# Patient Record
Sex: Male | Born: 1965 | Race: Black or African American | Hispanic: No | Marital: Married | Smoking: Never smoker
Health system: Southern US, Community
[De-identification: ages and names within clinical notes are randomized; demographics above are authoritative.]

## PROBLEM LIST (undated history)

## (undated) DIAGNOSIS — Z9114 Patient's other noncompliance with medication regimen: Secondary | ICD-10-CM

## (undated) DIAGNOSIS — I2510 Atherosclerotic heart disease of native coronary artery without angina pectoris: Secondary | ICD-10-CM

## (undated) DIAGNOSIS — I252 Old myocardial infarction: Secondary | ICD-10-CM

## (undated) DIAGNOSIS — C61 Malignant neoplasm of prostate: Secondary | ICD-10-CM

## (undated) DIAGNOSIS — E785 Hyperlipidemia, unspecified: Secondary | ICD-10-CM

## (undated) DIAGNOSIS — R570 Cardiogenic shock: Secondary | ICD-10-CM

## (undated) DIAGNOSIS — I2119 ST elevation (STEMI) myocardial infarction involving other coronary artery of inferior wall: Secondary | ICD-10-CM

## (undated) DIAGNOSIS — Z95820 Peripheral vascular angioplasty status with implants and grafts: Secondary | ICD-10-CM

## (undated) DIAGNOSIS — I219 Acute myocardial infarction, unspecified: Secondary | ICD-10-CM

## (undated) DIAGNOSIS — I5042 Chronic combined systolic (congestive) and diastolic (congestive) heart failure: Secondary | ICD-10-CM

## (undated) DIAGNOSIS — Z955 Presence of coronary angioplasty implant and graft: Secondary | ICD-10-CM

## (undated) DIAGNOSIS — E039 Hypothyroidism, unspecified: Secondary | ICD-10-CM

## (undated) DIAGNOSIS — Z91148 Patient's other noncompliance with medication regimen for other reason: Secondary | ICD-10-CM

## (undated) DIAGNOSIS — I10 Essential (primary) hypertension: Secondary | ICD-10-CM

## (undated) DIAGNOSIS — I255 Ischemic cardiomyopathy: Secondary | ICD-10-CM

## (undated) DIAGNOSIS — N400 Enlarged prostate without lower urinary tract symptoms: Secondary | ICD-10-CM

## (undated) HISTORY — DX: Old myocardial infarction: I25.2

## (undated) HISTORY — PX: PROSTATE BIOPSY: SHX241

## (undated) HISTORY — DX: Patient's other noncompliance with medication regimen for other reason: Z91.148

## (undated) HISTORY — DX: Benign prostatic hyperplasia without lower urinary tract symptoms: N40.0

## (undated) HISTORY — DX: Chronic combined systolic (congestive) and diastolic (congestive) heart failure: I50.42

## (undated) HISTORY — DX: Essential (primary) hypertension: I10

## (undated) HISTORY — DX: Presence of coronary angioplasty implant and graft: Z95.5

## (undated) HISTORY — DX: Ischemic cardiomyopathy: I25.5

## (undated) HISTORY — DX: Hyperlipidemia, unspecified: E78.5

## (undated) HISTORY — DX: Patient's other noncompliance with medication regimen: Z91.14

## (undated) HISTORY — DX: ST elevation (STEMI) myocardial infarction involving other coronary artery of inferior wall: I21.19

## (undated) HISTORY — DX: Atherosclerotic heart disease of native coronary artery without angina pectoris: I25.10

## (undated) NOTE — *Deleted (*Deleted)
Radiation Oncology         [phone]) [phone] ________________________________  Initial outpatient Consultation  Name: Randy Moore MRN: [id number]  Date: 06/19/2020  DOB: December 05, 1965  [name], Randy Picket, PA-C  Pace, Randy Croon, MD   REFERRING PHYSICIAN: Noel Christmas, MD  DIAGNOSIS: 1 y.o. gentleman with Stage T1c adenocarcinoma of the prostate with Gleason score of 3+4, and PSA of 6.49.  No diagnosis found.  HISTORY OF PRESENT ILLNESS: Randy Moore is a 8 y.o. male with a diagnosis of prostate cancer. He was noted to have an elevated PSA of 4.1 by his primary care physician, Randy Pascal, PA-C.  Accordingly, he was referred for evaluation in urology by Dr. Arita Moore on 04/03/2020,  digital rectal examination was performed at that time revealing no nodules.  Repeat PSA performed that day showed further elevation to 6.49.  The patient proceeded to transrectal ultrasound with 12 biopsies of the prostate on 05/01/2020.  The prostate volume measured 25.67 cc.  Out of 12 core biopsies, 5 were positive.  The maximum Gleason score was 3+4, and this was seen in left apex (with PNI). Additionally, Gleason 3+3 was seen in right apex lateral, right apex (two small foci), left apex lateral (two small foci), and left base lateral (small focus).  The patient reviewed the biopsy results with his urologist and he has kindly been referred today for discussion of potential radiation treatment options.   PREVIOUS RADIATION THERAPY: No  PAST MEDICAL HISTORY:  Past Medical History:  Diagnosis Date  . Acute right ventricular myocardial infarction (HCC) 09/21/2016  . Cardiogenic shock (HCC) 09/21/2016  . Chronic combined systolic and diastolic heart failure (HCC) 09/21/2016  . Coronary artery disease involving native coronary artery of native heart without angina pectoris 09/21/2016   a - Inf and RV infarct (STEMI) 1/18: LHC - pLAD, oD1 80, pRI 70, oLCx 30, mLCx 70, RCA 100, EF 45-50 >> PCI:  2.5 x 20 mm Promus DES to pRCA  // Staged PCI 09/22/16: 3 x 38 mm Promus DES to mLAD; Attempted POBA to oD2 (80 >> 80); 3 x 28 mm Promus DES to RI  . Enlarged prostate   . History of ST elevation myocardial infarction (STEMI) 09/21/2016  . Hyperlipidemia 09/29/2016  . Hyperlipidemia 09/29/2016  . Hypertension   . Ischemic cardiomyopathy 09/23/2016   a - s/p Inf and RV infarct (STEMI) 1/18 // b - Echo 09/23/16: EF 35-40, inf-septal HK, apical septal and apical AK, ant and inf AK, ant-septal AK, Gr 1 diastolic dysfunction // c -Echo 5/18: mild conc LVH, EF 35-40, Gr 1 DD, inf-septal, ant-septal, ant, apical AK  . Noncompliance with medication regimen   . Presence of drug coated stent in right coronary artery 09/21/2016  . Prostate cancer (HCC)   . S/P angioplasty with stent, LAD and Ramus with DES 09/22/16 09/23/2016  . STEMI involving oth coronary artery of inferior wall (HCC) 09/21/2016      PAST SURGICAL HISTORY: Past Surgical History:  Procedure Laterality Date  . CARDIAC CATHETERIZATION N/A 09/21/2016   Procedure: Left Heart Cath and Coronary Angiography;  Surgeon: Randy Bollman, MD;  Location: [facility] INVASIVE CV LAB;  Service: Cardiovascular;  Laterality: N/A;  . CARDIAC CATHETERIZATION N/A 09/21/2016   Procedure: Coronary Stent Intervention;  Surgeon: Randy Bollman, MD;  Location: [facility] INVASIVE CV LAB;  Service: Cardiovascular;  Laterality: N/A;  Prox RCA  . CARDIAC CATHETERIZATION N/A 09/22/2016   Procedure: Coronary CTO Intervention;  Surgeon: Randy Hazel, MD;  Location: [facility] INVASIVE CV  LAB;  Service: Cardiovascular;  Laterality: N/A;  . COLONOSCOPY WITH PROPOFOL N/A 12/16/2019   Procedure: COLONOSCOPY WITH PROPOFOL;  Surgeon: Randy Form, MD;  Location: [facility] ENDOSCOPY;  Service: Endoscopy;  Laterality: N/A;  . POLYPECTOMY  12/16/2019   Procedure: POLYPECTOMY;  Surgeon: Randy Form, MD;  Location: [facility] ENDOSCOPY;  Service: Endoscopy;;  . PROSTATE BIOPSY      FAMILY HISTORY:  Family History  Problem Relation  Age of Onset  . Other Mother        chest pains  . Heart attack Father   . Colon cancer Neg Hx   . Liver cancer Neg Hx   . Stomach cancer Neg Hx   . Rectal cancer Neg Hx   . Esophageal cancer Neg Hx     SOCIAL HISTORY:  Social History   Socioeconomic History  . Marital status: Married    Spouse name: Not on file  . Number of children: 4  . Years of education: Not on file  . Highest education level: Not on file  Occupational History  . Occupation: [employer]  Tobacco Use  . Smoking status: Never Smoker  . Smokeless tobacco: Never Used  Vaping Use  . Vaping Use: Never used  Substance and Sexual Activity  . Alcohol use: No  . Drug use: No  . Sexual activity: Yes  Other Topics Concern  . Not on file  Social History Narrative  . Not on file   Social Determinants of Health   Financial Resource Strain:   . Difficulty of Paying Living Expenses: Not on file  Food Insecurity:   . Worried About Programme researcher, broadcasting/film/video in the Last Year: Not on file  . Ran Out of Food in the Last Year: Not on file  Transportation Needs:   . Lack of Transportation (Medical): Not on file  . Lack of Transportation (Non-Medical): Not on file  Physical Activity:   . Days of Exercise per Week: Not on file  . Minutes of Exercise per Session: Not on file  Stress:   . Feeling of Stress : Not on file  Social Connections:   . Frequency of Communication with Friends and Family: Not on file  . Frequency of Social Gatherings with Friends and Family: Not on file  . Attends Religious Services: Not on file  . Active Member of Clubs or Organizations: Not on file  . Attends Banker Meetings: Not on file  . Marital Status: Not on file  Intimate Partner Violence:   . Fear of Current or Ex-Partner: Not on file  . Emotionally Abused: Not on file  . Physically Abused: Not on file  . Sexually Abused: Not on file    ALLERGIES: Patient has no known allergies.  MEDICATIONS:  Current  Outpatient Medications  Medication Sig Dispense Refill  . aspirin EC 81 MG EC tablet Take 1 tablet (81 mg total) by mouth daily.    Marland Kitchen atorvastatin (LIPITOR) 80 MG tablet Take 1 tablet (80 mg total) by mouth every evening. 90 tablet 2  . carvedilol (COREG) 6.25 MG tablet Take 1 tablet (6.25 mg total) by mouth in the morning and at bedtime. 180 tablet 3  . ezetimibe (ZETIA) 10 MG tablet Take 1 tablet (10 mg total) by mouth daily. 90 tablet 3  . famotidine (PEPCID) 20 MG tablet TAKE 1 TABLET(20 MG) BY MOUTH TWICE DAILY 60 tablet 5  . meloxicam (MOBIC) 7.5 MG tablet TAKE 1 TABLET(7.5 MG) BY MOUTH DAILY (Patient  not taking: Reported on 06/08/2020) 30 tablet 0  . nitroGLYCERIN (NITROSTAT) 0.4 MG SL tablet PLACE 1 TABLET UNDER TONGUE EVERY 5 MINUTES FOR 3 DOSES AS NEEDED FOR CHEST PAIN (Patient not taking: Reported on 05/22/2020) 25 tablet 4  . sacubitril-valsartan (ENTRESTO) 97-103 MG Take 1 tablet by mouth 2 (two) times daily. 60 tablet 11  . tamsulosin (FLOMAX) 0.4 MG CAPS capsule Take 0.4 mg by mouth at bedtime.     No current facility-administered medications for this encounter.    REVIEW OF SYSTEMS:  On review of systems, the patient reports that he is doing well overall. He denies any chest pain, shortness of breath, cough, fevers, chills, night sweats, unintended weight changes. He denies any bowel disturbances, and denies abdominal pain, nausea or vomiting. He denies any new musculoskeletal or joint aches or pains. His IPSS was ***, indicating *** urinary symptoms. His SHIM was ***, indicating he {does not have/has mild/moderate/severe} erectile dysfunction. A complete review of systems is obtained and is otherwise negative.    PHYSICAL EXAM:  Wt Readings from Last 3 Encounters:  06/08/20 178 lb 14.4 oz (81.1 kg)  05/22/20 176 lb (79.8 kg)  04/23/20 181 lb 12.8 oz (82.5 kg)   Temp Readings from Last 3 Encounters:  01/24/20 (!) 97.4 F (36.3 C)  12/16/19 (!) 97.5 F (36.4 C) (Axillary)   11/15/19 98.3 F (36.8 C)   BP Readings from Last 3 Encounters:  06/08/20 122/70  05/22/20 132/80  04/23/20 104/72   Pulse Readings from Last 3 Encounters:  06/08/20 71  05/22/20 63  04/23/20 73    /10  In general this is a well appearing African American male in no acute distress. He is alert and oriented x4 and appropriate throughout the examination. HEENT reveals that the patient is normocephalic, atraumatic. EOMs are intact. PERRLA. Skin is intact without any evidence of gross lesions. Cardiovascular exam reveals a regular rate and rhythm, no clicks rubs or murmurs are auscultated. Chest is clear to auscultation bilaterally. Lymphatic assessment is performed and does not reveal any adenopathy in the cervical, supraclavicular, axillary, or inguinal chains. Abdomen has active bowel sounds in all quadrants and is intact. The abdomen is soft, non tender, non distended. Lower extremities are negative for pretibial pitting edema, deep calf tenderness, cyanosis or clubbing.   KPS = ***  100 - Normal; no complaints; no evidence of disease. 90   - Able to carry on normal activity; minor signs or symptoms of disease. 80   - Normal activity with effort; some signs or symptoms of disease. 18   - Cares for self; unable to carry on normal activity or to do active work. 60   - Requires occasional assistance, but is able to care for most of his personal needs. 50   - Requires considerable assistance and frequent medical care. 40   - Disabled; requires special care and assistance. 30   - Severely disabled; hospital admission is indicated although death not imminent. 20   - Very sick; hospital admission necessary; active supportive treatment necessary. 10   - Moribund; fatal processes progressing rapidly. 0     - Dead  Karnofsky DA, Abelmann WH, Craver LS and Burchenal [facility] [phone]) The use of the nitrogen mustards in the palliative treatment of carcinoma: with particular reference to bronchogenic  carcinoma Cancer 1 634-56  LABORATORY DATA:  Lab Results  Component Value Date   WBC 4.2 12/13/2017   HGB 14.5 12/13/2017   HCT 42.7 12/13/2017  MCV 81.8 12/13/2017   PLT 163 12/13/2017   Lab Results  Component Value Date   NA 141 06/08/2020   K 4.0 06/08/2020   CL 102 06/08/2020   CO2 29 06/08/2020   Lab Results  Component Value Date   ALT 25 04/10/2020   AST 25 04/10/2020   ALKPHOS 72 04/10/2020   BILITOT 0.8 04/10/2020     RADIOGRAPHY: ECHOCARDIOGRAM COMPLETE  Result Date: 05/30/2020    ECHOCARDIOGRAM REPORT   Patient Name:   SHEDRIC FREDERICKS   Date of Exam: 05/29/2020 Medical Rec #:  [id number]     Height:       68.0 in Accession #:    [id number]    Weight:       176.0 lb Date of Birth:  03-18-1966     BSA:          1.936 m Patient Age:    54 years      BP:           104/72 mmHg Patient Gender: M             HR:           70 bpm. Exam Location:  [address] Procedure: 2D Echo, Cardiac Doppler, Color Doppler and Intracardiac            Opacification Agent Indications:    I50.9 Congestive heart failure.  History:        Patient has prior history of Echocardiogram examinations, most                 recent 02/18/2019. Risk Factors:Hypertension and Dyslipidemia.                 Cardiogenic shock. Chronic systolic heart failure. STEMI.                 Ischemic cardiomyopathy. Coronary artery disease.  Sonographer:    Sedonia Small Rodgers-Jones RDCS Referring Phys: [id number] MICHAEL COOPER IMPRESSIONS  1. Left ventricular ejection fraction, by estimation, is 40 to 45%. The left ventricle has mildly decreased function. The left ventricle demonstrates regional wall motion abnormalities (see scoring diagram/findings for description). Left ventricular diastolic parameters are consistent with Grade I diastolic dysfunction (impaired relaxation).  2. Right ventricular systolic function is normal. The right ventricular size is normal. Tricuspid regurgitation signal is inadequate for assessing PA pressure.  3. The  mitral valve is normal in structure. No evidence of mitral valve regurgitation.  4. The aortic valve is tricuspid. Aortic valve regurgitation is not visualized. No aortic stenosis is present.  5. The inferior vena cava is normal in size with greater than 50% respiratory variability, suggesting right atrial pressure of 3 mmHg. FINDINGS  Left Ventricle: Left ventricular ejection fraction, by estimation, is 40 to 45%. The left ventricle has mildly decreased function. The left ventricle demonstrates regional wall motion abnormalities. Definity contrast agent was given IV to delineate the left ventricular endocardial borders. The left ventricular internal cavity size was normal in size. There is no left ventricular hypertrophy. Left ventricular diastolic parameters are consistent with Grade I diastolic dysfunction (impaired relaxation).  LV Wall Scoring: The mid and distal anterior septum, apical anterior segment, and apex are akinetic. The mid inferoseptal segment and mid anterior segment are hypokinetic. The entire lateral wall, entire inferior wall, basal anteroseptal segment, basal anterior segment, and basal inferoseptal segment are normal. Right Ventricle: The right ventricular size is normal. Right vetricular wall thickness was not assessed. Right ventricular systolic function is normal. Tricuspid  regurgitation signal is inadequate for assessing PA pressure. Left Atrium: Left atrial size was normal in size. Right Atrium: Right atrial size was normal in size. Pericardium: There is no evidence of pericardial effusion. Mitral Valve: The mitral valve is normal in structure. No evidence of mitral valve regurgitation. Tricuspid Valve: The tricuspid valve is normal in structure. Tricuspid valve regurgitation is trivial. Aortic Valve: The aortic valve is tricuspid. Aortic valve regurgitation is not visualized. No aortic stenosis is present. Pulmonic Valve: The pulmonic valve was not well visualized. Pulmonic valve  regurgitation is not visualized. Aorta: The aortic root and ascending aorta are structurally normal, with no evidence of dilitation. Venous: The inferior vena cava is normal in size with greater than 50% respiratory variability, suggesting right atrial pressure of 3 mmHg. IAS/Shunts: The interatrial septum was not well visualized.  LEFT VENTRICLE PLAX 2D LVIDd:         5.10 cm  Diastology LVIDs:         3.10 cm  LV e' medial:    6.64 cm/s LV PW:         0.70 cm  LV E/e' medial:  13.1 LV IVS:        0.60 cm  LV e' lateral:   6.74 cm/s LVOT diam:     1.70 cm  LV E/e' lateral: 12.9 LV SV:         49 LV SV Index:   25 LVOT Area:     2.27 cm  RIGHT VENTRICLE             IVC RV Basal diam:  4.20 cm     IVC diam: 0.80 cm RV S prime:     16.00 cm/s TAPSE (M-mode): 2.6 cm LEFT ATRIUM             Index       RIGHT ATRIUM           Index LA diam:        3.40 cm 1.76 cm/m  RA Area:     15.20 cm LA Vol (A2C):   43.6 ml 22.52 ml/m RA Volume:   42.00 ml  21.70 ml/m LA Vol (A4C):   52.0 ml 26.86 ml/m LA Biplane Vol: 49.0 ml 25.31 ml/m  AORTIC VALVE LVOT Vmax:   109.00 cm/s LVOT Vmean:  75.500 cm/s LVOT VTI:    0.214 m  AORTA Ao Root diam: 2.90 cm Ao Asc diam:  3.40 cm MITRAL VALVE MV Area (PHT): 3.21 cm     SHUNTS MV Decel Time: 236 msec     Systemic VTI:  0.21 m MV E velocity: 87.00 cm/s   Systemic Diam: 1.70 cm MV A velocity: 104.00 cm/s MV E/A ratio:  0.84 Epifanio Lesches MD Electronically signed by Epifanio Lesches MD Signature Date/Time: 05/30/2020/2:31:29 PM    Final       IMPRESSION/PLAN: 1. 41 y.o. gentleman with Stage T1c adenocarcinoma of the prostate with Gleason Score of 3+4, and PSA of 6.49. We discussed the patient's workup and outlined the nature of prostate cancer in this setting. The patient's T stage, Gleason's score, and PSA put him into the favorable intermediate risk group. Accordingly, he is eligible for a variety of potential treatment options including brachytherapy, 5.5 weeks of  external radiation, or prostatectomy. We discussed the available radiation techniques, and focused on the details and logistics of delivery. We discussed and outlined the risks, benefits, short and long-term effects associated with radiotherapy and compared and contrasted these with  prostatectomy. We discussed the role of SpaceOAR gel in reducing the rectal toxicity associated with radiotherapy.  He appears to have a good understanding of his disease and our treatment recommendations which are of curative intent.  He was encouraged to ask questions that were answered to his stated satisfaction.  At the conclusion of our conversation, the patient is interested in moving forward with ***.  We spent {CHL ONC TIME VISIT - UEAVW:[id number]} face to face with the patient and more than 50% of that time was spent in counseling and/or coordination of care.    Marguarite Arbour, PA-C    Margaretmary Dys, MD  [facility]  Radiation Oncology Direct Dial: [phone]  Fax: [phone] [email or web address]  Skype  [email or web address]   This document serves as a record of services personally performed by Margaretmary Dys, MD and Marcello Fennel, PA-C. It was created on their behalf by Mickie Bail, a trained medical scribe. The creation of this record is based on the scribe's personal observations and the provider's statements to them. This document has been checked and approved by the attending provider.

---

## 2016-09-21 ENCOUNTER — Encounter (HOSPITAL_COMMUNITY)
Admission: EM | Disposition: A | Discharge status: Home / Self Care | Payer: Self-pay | Source: Home / Self Care | Attending: Cardiovascular Disease

## 2016-09-21 ENCOUNTER — Inpatient Hospital Stay (HOSPITAL_COMMUNITY)
Admission: EM | Admit: 2016-09-21 | Discharge: 2016-09-23 | DRG: 246 | Disposition: A | Discharge status: Home / Self Care | Payer: Managed Care, Other (non HMO) | Attending: Cardiovascular Disease | Admitting: Cardiovascular Disease

## 2016-09-21 ENCOUNTER — Ambulatory Visit (HOSPITAL_COMMUNITY): Admit: 2016-09-21 | Payer: Self-pay | Admitting: Cardiovascular Disease

## 2016-09-21 ENCOUNTER — Encounter (HOSPITAL_COMMUNITY): Payer: Self-pay

## 2016-09-21 ENCOUNTER — Emergency Department (HOSPITAL_COMMUNITY): Payer: Managed Care, Other (non HMO)

## 2016-09-21 DIAGNOSIS — I219 Acute myocardial infarction, unspecified: Secondary | ICD-10-CM | POA: Diagnosis present

## 2016-09-21 DIAGNOSIS — Z23 Encounter for immunization: Secondary | ICD-10-CM | POA: Diagnosis not present

## 2016-09-21 DIAGNOSIS — I2510 Atherosclerotic heart disease of native coronary artery without angina pectoris: Secondary | ICD-10-CM

## 2016-09-21 DIAGNOSIS — R001 Bradycardia, unspecified: Secondary | ICD-10-CM | POA: Diagnosis present

## 2016-09-21 DIAGNOSIS — I5041 Acute combined systolic (congestive) and diastolic (congestive) heart failure: Secondary | ICD-10-CM | POA: Diagnosis present

## 2016-09-21 DIAGNOSIS — K219 Gastro-esophageal reflux disease without esophagitis: Secondary | ICD-10-CM | POA: Diagnosis present

## 2016-09-21 DIAGNOSIS — I25119 Atherosclerotic heart disease of native coronary artery with unspecified angina pectoris: Secondary | ICD-10-CM

## 2016-09-21 DIAGNOSIS — E876 Hypokalemia: Secondary | ICD-10-CM | POA: Diagnosis present

## 2016-09-21 DIAGNOSIS — Z8679 Personal history of other diseases of the circulatory system: Secondary | ICD-10-CM | POA: Diagnosis present

## 2016-09-21 DIAGNOSIS — I959 Hypotension, unspecified: Secondary | ICD-10-CM | POA: Diagnosis present

## 2016-09-21 DIAGNOSIS — I2111 ST elevation (STEMI) myocardial infarction involving right coronary artery: Secondary | ICD-10-CM | POA: Diagnosis not present

## 2016-09-21 DIAGNOSIS — I429 Cardiomyopathy, unspecified: Secondary | ICD-10-CM | POA: Diagnosis present

## 2016-09-21 DIAGNOSIS — I5089 Other heart failure: Secondary | ICD-10-CM | POA: Diagnosis present

## 2016-09-21 DIAGNOSIS — I2119 ST elevation (STEMI) myocardial infarction involving other coronary artery of inferior wall: Principal | ICD-10-CM | POA: Diagnosis present

## 2016-09-21 DIAGNOSIS — I213 ST elevation (STEMI) myocardial infarction of unspecified site: Secondary | ICD-10-CM

## 2016-09-21 DIAGNOSIS — Z955 Presence of coronary angioplasty implant and graft: Secondary | ICD-10-CM

## 2016-09-21 DIAGNOSIS — I5042 Chronic combined systolic (congestive) and diastolic (congestive) heart failure: Secondary | ICD-10-CM | POA: Diagnosis present

## 2016-09-21 DIAGNOSIS — R079 Chest pain, unspecified: Secondary | ICD-10-CM

## 2016-09-21 DIAGNOSIS — R570 Cardiogenic shock: Secondary | ICD-10-CM

## 2016-09-21 DIAGNOSIS — R0789 Other chest pain: Secondary | ICD-10-CM | POA: Diagnosis present

## 2016-09-21 DIAGNOSIS — I252 Old myocardial infarction: Secondary | ICD-10-CM

## 2016-09-21 DIAGNOSIS — R9431 Abnormal electrocardiogram [ECG] [EKG]: Secondary | ICD-10-CM | POA: Diagnosis not present

## 2016-09-21 DIAGNOSIS — I2582 Chronic total occlusion of coronary artery: Secondary | ICD-10-CM | POA: Diagnosis not present

## 2016-09-21 DIAGNOSIS — Z95820 Peripheral vascular angioplasty status with implants and grafts: Secondary | ICD-10-CM

## 2016-09-21 DIAGNOSIS — I25110 Atherosclerotic heart disease of native coronary artery with unstable angina pectoris: Secondary | ICD-10-CM | POA: Diagnosis present

## 2016-09-21 DIAGNOSIS — I255 Ischemic cardiomyopathy: Secondary | ICD-10-CM

## 2016-09-21 DIAGNOSIS — Z87898 Personal history of other specified conditions: Secondary | ICD-10-CM | POA: Diagnosis present

## 2016-09-21 HISTORY — DX: Peripheral vascular angioplasty status with implants and grafts: Z95.820

## 2016-09-21 HISTORY — DX: Old myocardial infarction: I25.2

## 2016-09-21 HISTORY — DX: Presence of coronary angioplasty implant and graft: Z95.5

## 2016-09-21 HISTORY — DX: ST elevation (STEMI) myocardial infarction involving other coronary artery of inferior wall: I21.19

## 2016-09-21 HISTORY — DX: Acute myocardial infarction, unspecified: I21.9

## 2016-09-21 HISTORY — DX: Cardiogenic shock: R57.0

## 2016-09-21 HISTORY — DX: Chronic combined systolic (congestive) and diastolic (congestive) heart failure: I50.42

## 2016-09-21 HISTORY — PX: CARDIAC CATHETERIZATION: SHX172

## 2016-09-21 HISTORY — DX: Atherosclerotic heart disease of native coronary artery without angina pectoris: I25.10

## 2016-09-21 LAB — CBC WITH DIFFERENTIAL/PLATELET
BASOS ABS: 0 10*3/uL (ref 0.0–0.1)
BASOS PCT: 0 %
Eosinophils Absolute: 0.2 10*3/uL (ref 0.0–0.7)
Eosinophils Relative: 3 %
HEMATOCRIT: 42.1 % (ref 39.0–52.0)
Hemoglobin: 14.4 g/dL (ref 13.0–17.0)
LYMPHS PCT: 41 %
Lymphs Abs: 4 10*3/uL (ref 0.7–4.0)
MCH: 28.1 pg (ref 26.0–34.0)
MCHC: 34.2 g/dL (ref 30.0–36.0)
MCV: 82.1 fL (ref 78.0–100.0)
Monocytes Absolute: 0.4 10*3/uL (ref 0.1–1.0)
Monocytes Relative: 4 %
NEUTROS ABS: 5 10*3/uL (ref 1.7–7.7)
NEUTROS PCT: 52 %
Platelets: 180 10*3/uL (ref 150–400)
RBC: 5.13 MIL/uL (ref 4.22–5.81)
RDW: 13.1 % (ref 11.5–15.5)
WBC: 9.6 10*3/uL (ref 4.0–10.5)

## 2016-09-21 LAB — TROPONIN I
TROPONIN I: 2.35 ng/mL — AB (ref <0.03)
Troponin I: 15.96 ng/mL (ref <0.03)
Troponin I: 20.68 ng/mL (ref <0.03)

## 2016-09-21 LAB — LIPID PANEL
CHOLESTEROL: 136 mg/dL (ref 0–200)
HDL: 38 mg/dL — ABNORMAL LOW (ref >40)
LDL Cholesterol: 92 mg/dL (ref 0–99)
TRIGLYCERIDES: 32 mg/dL (ref <150)
Total CHOL/HDL Ratio: 3.6 RATIO
VLDL: 6 mg/dL (ref 0–40)

## 2016-09-21 LAB — I-STAT TROPONIN, ED: Troponin i, poc: 0 ng/mL (ref 0.00–0.08)

## 2016-09-21 LAB — BASIC METABOLIC PANEL
ANION GAP: 15 (ref 5–15)
ANION GAP: 4 — AB (ref 5–15)
BUN: 6 mg/dL (ref 6–20)
BUN: 5 mg/dL — ABNORMAL LOW (ref 6–20)
CALCIUM: 9 mg/dL (ref 8.9–10.3)
CHLORIDE: 111 mmol/L (ref 101–111)
CO2: 27 mmol/L (ref 22–32)
CO2: 25 mmol/L (ref 22–32)
Calcium: 8.2 mg/dL — ABNORMAL LOW (ref 8.9–10.3)
Chloride: 101 mmol/L (ref 101–111)
Creatinine, Ser: 1.46 mg/dL — ABNORMAL HIGH (ref 0.61–1.24)
Creatinine, Ser: 1.13 mg/dL (ref 0.61–1.24)
GFR calc non Af Amer: >60 mL/min (ref >60)
GFR, EST NON AFRICAN AMERICAN: 54 mL/min — AB (ref >60)
GLUCOSE: 107 mg/dL — AB (ref 65–99)
Glucose, Bld: 145 mg/dL — ABNORMAL HIGH (ref 65–99)
POTASSIUM: 3.1 mmol/L — AB (ref 3.5–5.1)
Potassium: 4.1 mmol/L (ref 3.5–5.1)
SODIUM: 143 mmol/L (ref 135–145)
Sodium: 140 mmol/L (ref 135–145)

## 2016-09-21 LAB — POCT ACTIVATED CLOTTING TIME
ACTIVATED CLOTTING TIME: 219 s
Activated Clotting Time: 235 seconds

## 2016-09-21 LAB — CBC
HEMATOCRIT: 37.5 % — AB (ref 39.0–52.0)
HEMOGLOBIN: 12.7 g/dL — AB (ref 13.0–17.0)
MCH: 27.6 pg (ref 26.0–34.0)
MCHC: 33.9 g/dL (ref 30.0–36.0)
MCV: 81.5 fL (ref 78.0–100.0)
Platelets: 154 10*3/uL (ref 150–400)
RBC: 4.6 MIL/uL (ref 4.22–5.81)
RDW: 13.3 % (ref 11.5–15.5)
WBC: 10.7 10*3/uL — ABNORMAL HIGH (ref 4.0–10.5)

## 2016-09-21 LAB — MRSA PCR SCREENING: MRSA BY PCR: NEGATIVE

## 2016-09-21 LAB — HEPARIN LEVEL (UNFRACTIONATED): Heparin Unfractionated: 0.35 IU/mL (ref 0.30–0.70)

## 2016-09-21 LAB — T4, FREE: FREE T4: 1.08 ng/dL (ref 0.61–1.12)

## 2016-09-21 LAB — TSH: TSH: 2.579 u[IU]/mL (ref 0.350–4.500)

## 2016-09-21 SURGERY — LEFT HEART CATH AND CORONARY ANGIOGRAPHY

## 2016-09-21 MED ORDER — FUROSEMIDE 10 MG/ML IJ SOLN
20.0000 mg | Freq: Two times a day (BID) | INTRAMUSCULAR | Status: AC
  Administered 2016-09-21 (×2): 20 mg via INTRAVENOUS
  Filled 2016-09-21 (×2): qty 2

## 2016-09-21 MED ORDER — IOPAMIDOL (ISOVUE-370) INJECTION 76%
INTRAVENOUS | Status: AC
  Filled 2016-09-21: qty 50

## 2016-09-21 MED ORDER — SODIUM CHLORIDE 0.9 % IV BOLUS (SEPSIS)
1000.0000 mL | Freq: Once | INTRAVENOUS | Status: AC
  Administered 2016-09-21: 1000 mL via INTRAVENOUS

## 2016-09-21 MED ORDER — TIROFIBAN (AGGRASTAT) BOLUS VIA INFUSION
INTRAVENOUS | Status: DC | PRN
  Administered 2016-09-21: 2030 ug via INTRAVENOUS

## 2016-09-21 MED ORDER — SODIUM CHLORIDE 0.9% FLUSH
3.0000 mL | Freq: Two times a day (BID) | INTRAVENOUS | Status: DC
  Administered 2016-09-21: 3 mL via INTRAVENOUS

## 2016-09-21 MED ORDER — HEPARIN SODIUM (PORCINE) 5000 UNIT/ML IJ SOLN: 5000.0000 [IU] | Freq: Three times a day (TID) | INTRAMUSCULAR | Status: DC

## 2016-09-21 MED ORDER — PROPOFOL 1000 MG/100ML IV EMUL
INTRAVENOUS | Status: AC
  Filled 2016-09-21: qty 100

## 2016-09-21 MED ORDER — HEPARIN SODIUM (PORCINE) 1000 UNIT/ML IJ SOLN
INTRAMUSCULAR | Status: AC
Start: 2016-09-21 — End: 2016-09-21
  Filled 2016-09-21: qty 1

## 2016-09-21 MED ORDER — INFLUENZA VAC SPLIT QUAD 0.5 ML IM SUSY
0.5000 mL | PREFILLED_SYRINGE | INTRAMUSCULAR | Status: AC
  Administered 2016-09-23: 0.5 mL via INTRAMUSCULAR
  Filled 2016-09-21: qty 0.5

## 2016-09-21 MED ORDER — TIROFIBAN HCL IN NACL 5-0.9 MG/100ML-% IV SOLN
0.1500 ug/kg/min | INTRAVENOUS | Status: DC
  Administered 2016-09-21: 0.15 ug/kg/min via INTRAVENOUS
  Filled 2016-09-21: qty 100

## 2016-09-21 MED ORDER — ATROPINE SULFATE 1 MG/10ML IJ SOSY
0.6000 mg | PREFILLED_SYRINGE | Freq: Once | INTRAMUSCULAR | Status: AC
  Administered 2016-09-21: 0.6 mg via INTRAVENOUS

## 2016-09-21 MED ORDER — IOPAMIDOL (ISOVUE-370) INJECTION 76%
INTRAVENOUS | Status: AC
  Filled 2016-09-21: qty 125

## 2016-09-21 MED ORDER — SODIUM CHLORIDE 0.9 % IV SOLN: 250.0000 mL | INTRAVENOUS | Status: DC | PRN

## 2016-09-21 MED ORDER — VERAPAMIL HCL 2.5 MG/ML IV SOLN
INTRAVENOUS | Status: AC
  Filled 2016-09-21: qty 2

## 2016-09-21 MED ORDER — HEPARIN (PORCINE) IN NACL 100-0.45 UNIT/ML-% IJ SOLN
1000.0000 [IU]/h | INTRAMUSCULAR | Status: DC
Start: 2016-09-21 — End: 2016-09-21
  Filled 2016-09-21 (×2): qty 250

## 2016-09-21 MED ORDER — HEPARIN (PORCINE) IN NACL 100-0.45 UNIT/ML-% IJ SOLN
1000.0000 [IU]/h | INTRAMUSCULAR | Status: DC
  Administered 2016-09-21: 1000 [IU]/h via INTRAVENOUS
  Filled 2016-09-21: qty 250

## 2016-09-21 MED ORDER — TICAGRELOR 90 MG PO TABS
180.0000 mg | ORAL_TABLET | Freq: Once | ORAL | Status: AC
  Administered 2016-09-21: 180 mg via ORAL

## 2016-09-21 MED ORDER — HEPARIN SODIUM (PORCINE) 5000 UNIT/ML IJ SOLN
4000.0000 [IU] | Freq: Once | INTRAMUSCULAR | Status: AC
Start: 2016-09-21 — End: 2016-09-21
  Administered 2016-09-21: 4000 [IU] via INTRAVENOUS
  Filled 2016-09-21: qty 1

## 2016-09-21 MED ORDER — ONDANSETRON HCL 4 MG/2ML IJ SOLN
INTRAMUSCULAR | Status: AC
  Filled 2016-09-21: qty 2

## 2016-09-21 MED ORDER — SODIUM CHLORIDE 0.9 % WEIGHT BASED INFUSION
3.0000 mL/kg/h | INTRAVENOUS | Status: DC
  Administered 2016-09-22: 3 mL/kg/h via INTRAVENOUS

## 2016-09-21 MED ORDER — TIROFIBAN HCL IN NACL 5-0.9 MG/100ML-% IV SOLN
INTRAVENOUS | Status: AC
  Filled 2016-09-21: qty 100

## 2016-09-21 MED ORDER — ONDANSETRON HCL 4 MG/2ML IJ SOLN: 4.0000 mg | Freq: Four times a day (QID) | INTRAMUSCULAR | Status: DC | PRN

## 2016-09-21 MED ORDER — LABETALOL HCL 5 MG/ML IV SOLN: 10.0000 mg | INTRAVENOUS | Status: AC | PRN

## 2016-09-21 MED ORDER — SODIUM CHLORIDE 0.9 % IV SOLN
INTRAVENOUS | Status: DC | PRN
  Administered 2016-09-21: 50 mL/h via INTRAVENOUS

## 2016-09-21 MED ORDER — MORPHINE SULFATE (PF) 2 MG/ML IV SOLN: 2.0000 mg | INTRAVENOUS | Status: DC | PRN

## 2016-09-21 MED ORDER — LIDOCAINE HCL (PF) 1 % IJ SOLN
INTRAMUSCULAR | Status: DC | PRN
  Administered 2016-09-21: 2 mL

## 2016-09-21 MED ORDER — HYDRALAZINE HCL 20 MG/ML IJ SOLN: 5.0000 mg | INTRAMUSCULAR | Status: AC | PRN

## 2016-09-21 MED ORDER — SODIUM CHLORIDE 0.9 % IV SOLN
INTRAVENOUS | Status: AC
  Administered 2016-09-21: 04:00:00 via INTRAVENOUS

## 2016-09-21 MED ORDER — HEPARIN (PORCINE) IN NACL 2-0.9 UNIT/ML-% IJ SOLN
INTRAMUSCULAR | Status: AC
  Filled 2016-09-21: qty 1000

## 2016-09-21 MED ORDER — NITROGLYCERIN 1 MG/10 ML FOR IR/CATH LAB
INTRA_ARTERIAL | Status: AC
Start: 2016-09-21 — End: 2016-09-21
  Filled 2016-09-21: qty 10

## 2016-09-21 MED ORDER — SODIUM CHLORIDE 0.9% FLUSH: 3.0000 mL | INTRAVENOUS | Status: DC | PRN

## 2016-09-21 MED ORDER — ASPIRIN 81 MG PO CHEW
324.0000 mg | CHEWABLE_TABLET | Freq: Once | ORAL | Status: AC
  Administered 2016-09-21: 324 mg via ORAL
  Filled 2016-09-21: qty 4

## 2016-09-21 MED ORDER — NITROGLYCERIN 1 MG/10 ML FOR IR/CATH LAB
INTRA_ARTERIAL | Status: DC | PRN
  Administered 2016-09-21: 100 ug

## 2016-09-21 MED ORDER — HEPARIN (PORCINE) IN NACL 2-0.9 UNIT/ML-% IJ SOLN
INTRAMUSCULAR | Status: DC | PRN
  Administered 2016-09-21: 10 mL via INTRA_ARTERIAL

## 2016-09-21 MED ORDER — NITROGLYCERIN 0.4 MG SL SUBL: 0.4000 mg | SUBLINGUAL_TABLET | SUBLINGUAL | Status: DC | PRN

## 2016-09-21 MED ORDER — ATORVASTATIN CALCIUM 80 MG PO TABS
80.0000 mg | ORAL_TABLET | Freq: Every day | ORAL | Status: DC
  Administered 2016-09-21 – 2016-09-23 (×3): 80 mg via ORAL
  Filled 2016-09-21 (×3): qty 1

## 2016-09-21 MED ORDER — TICAGRELOR 90 MG PO TABS
90.0000 mg | ORAL_TABLET | Freq: Two times a day (BID) | ORAL | Status: DC
  Administered 2016-09-21 – 2016-09-23 (×4): 90 mg via ORAL
  Filled 2016-09-21 (×5): qty 1

## 2016-09-21 MED ORDER — ASPIRIN EC 81 MG PO TBEC
81.0000 mg | DELAYED_RELEASE_TABLET | Freq: Every day | ORAL | Status: DC
  Administered 2016-09-22 – 2016-09-23 (×2): 81 mg via ORAL
  Filled 2016-09-21 (×2): qty 1

## 2016-09-21 MED ORDER — ONDANSETRON HCL 4 MG/2ML IJ SOLN
INTRAMUSCULAR | Status: DC | PRN
  Administered 2016-09-21: 4 mg via INTRAVENOUS

## 2016-09-21 MED ORDER — LIDOCAINE HCL (PF) 1 % IJ SOLN
INTRAMUSCULAR | Status: AC
Start: 2016-09-21 — End: 2016-09-21
  Filled 2016-09-21: qty 30

## 2016-09-21 MED ORDER — ACETAMINOPHEN 325 MG PO TABS: 650.0000 mg | ORAL_TABLET | ORAL | Status: DC | PRN

## 2016-09-21 MED ORDER — TIROFIBAN HCL IV 12.5 MG/250 ML
INTRAVENOUS | Status: DC | PRN
  Administered 2016-09-21: .15 ug/kg/min via INTRAVENOUS

## 2016-09-21 MED ORDER — SODIUM CHLORIDE 0.9 % WEIGHT BASED INFUSION
1.0000 mL/kg/h | INTRAVENOUS | Status: DC
  Administered 2016-09-22: 1 mL/kg/h via INTRAVENOUS

## 2016-09-21 MED ORDER — HEPARIN SODIUM (PORCINE) 1000 UNIT/ML IJ SOLN
INTRAMUSCULAR | Status: DC | PRN
  Administered 2016-09-21: 5000 [IU] via INTRAVENOUS

## 2016-09-21 MED ORDER — SODIUM CHLORIDE 0.9% FLUSH
3.0000 mL | Freq: Two times a day (BID) | INTRAVENOUS | Status: DC
  Administered 2016-09-21 – 2016-09-22 (×2): 3 mL via INTRAVENOUS

## 2016-09-21 MED ORDER — DOPAMINE-DEXTROSE 3.2-5 MG/ML-% IV SOLN
INTRAVENOUS | Status: DC | PRN
  Administered 2016-09-21: 5 ug/kg/min via INTRAVENOUS

## 2016-09-21 SURGICAL SUPPLY — 20 items
BALLN MOZEC 2.0X12 (BALLOONS) ×3
BALLN ~~LOC~~ MOZEC 2.75X15 (BALLOONS) ×2
BALLOON MOZEC 2.0X12 (BALLOONS) ×1 IMPLANT
BALLOON ~~LOC~~ MOZEC 2.75X15 (BALLOONS) ×1 IMPLANT
CATH INFINITI 5 FR JL3.5 (CATHETERS) ×3 IMPLANT
CATH INFINITI 5FR ANG PIGTAIL (CATHETERS) ×3 IMPLANT
CATH INFINITI JR4 5F (CATHETERS) ×3 IMPLANT
CATH VISTA GUIDE 6FR JR4 (CATHETERS) ×3 IMPLANT
DEVICE RAD COMP TR BAND LRG (VASCULAR PRODUCTS) ×3 IMPLANT
GLIDESHEATH SLEND SS 6F .021 (SHEATH) ×3 IMPLANT
GUIDEWIRE INQWIRE 1.5J.035X260 (WIRE) ×1 IMPLANT
INQWIRE 1.5J .035X260CM (WIRE) ×3
KIT ENCORE 26 ADVANTAGE (KITS) ×3 IMPLANT
KIT HEART LEFT (KITS) ×3 IMPLANT
PACK CARDIAC CATHETERIZATION (CUSTOM PROCEDURE TRAY) ×3 IMPLANT
STENT PROMUS PREM MR 2.5X20 (Permanent Stent) ×3 IMPLANT
SYR MEDRAD MARK V 150ML (SYRINGE) ×3 IMPLANT
TRANSDUCER W/STOPCOCK (MISCELLANEOUS) ×3 IMPLANT
TUBING CIL FLEX 10 FLL-RA (TUBING) ×3 IMPLANT
WIRE COUGAR XT STRL 190CM (WIRE) ×3 IMPLANT

## 2016-09-21 NOTE — Progress Notes (Signed)
ANTICOAGULATION CONSULT NOTE - Initial Consult  Pharmacy Consult for heparin Indication: chest pain/ACS  No Known Allergies  Patient Measurements: Height: 5\' 8"  (172.7 cm) Weight: 181 lb 10.5 oz (82.4 kg) IBW/kg (Calculated) : 68.4   Vital Signs: Temp: 98.8 F (37.1 C) (01/31 1900) Temp Source: Oral (01/31 1900) BP: 145/95 (01/31 2100) Pulse Rate: 82 (01/31 2100)  Labs:  Recent Labs  09/21/16 0110 09/21/16 0346 09/21/16 0811 09/21/16 1444 09/21/16 2045  HGB 14.4  --  12.7*  --   --   HCT 42.1  --  37.5*  --   --   PLT 180  --  154  --   --   HEPARINUNFRC  --   --   --   --  0.35  CREATININE 1.46*  --  1.13  --   --   TROPONINI  --  2.35* 15.96* 20.68*  --     Estimated Creatinine Clearance: 80 mL/min (by C-G formula based on SCr of 1.13 mg/dL).Stable clearance   Medical History: Past Medical History:  Diagnosis Date  . Acute right ventricular myocardial infarction 09/21/2016  . Acute systolic heart failure ([place]) 09/21/2016  . Cardiogenic shock ([place]) 09/21/2016  . Multivessel CAD - 100% RCA (PCI), 99% SubTmLAD, 70-80% RI & dCx 09/21/2016    Assessment: Pt is s/p STEMI with DES placed to RCA, needs to be staged for further PCI intervention or possible CABG for LAD blockage. Tirofiban drip stopped this AM. Currently no bleeding, H/H low stable. Plt WNL.  Initial lvl on 1000 units/hr hep was within goal at 0.35  Goal of Therapy:  Heparin level 0.3-0.7 units/ml Monitor platelets by anticoagulation protocol: Yes   Plan:  Continue heparin 1000 units/hr Daily CBC/heparin level Monitor for s/sx bleeding  Levester Fresh, PharmD, BCPS, BCCCP Clinical Pharmacist 09/21/2016 9:26 PM

## 2016-09-21 NOTE — Progress Notes (Signed)
CRITICAL VALUE ALERT  Critical value received Troponin 2.35  Date of notification:  09/21/2016  Time of notification:  [id number]  Critical value read back:Yes.    Nurse who received alert:  Baxter Flattery RN  Pt post STEMI MD aware

## 2016-09-21 NOTE — ED Provider Notes (Signed)
[facility] DEPT Provider Note   CSN: [id number] Arrival date & time: 09/21/16  0058     History   Chief Complaint Chief Complaint  Patient presents with  . Chest Pain    HPI Randy Moore is a 51 y.o. male.  He had onset about 11 PM of a sharp pain in the mid chest area without radiation. He thought it was from some gastrointestinal problems and took some Zantac. There is no associated dyspnea or nausea. He did have some mild diaphoresis initially. Nothing makes the pain better nothing makes it worse. He states he has had similar pains in the past which have been from his stomach. He is a nonsmoker without history of diabetes or hypertension or hyperlipidemia and no family history of premature coronary atherosclerosis. He currently rates the pain at 8/10. He was brought in by EMS but refused aspirin.   The history is provided by the patient.    History reviewed. No pertinent past medical history.  There are no active problems to display for this patient.   History reviewed. No pertinent surgical history.     Home Medications    Prior to Admission medications   Not on File    Family History History reviewed. No pertinent family history.  Social History Social History  Substance Use Topics  . Smoking status: Never Smoker  . Smokeless tobacco: Never Used  . Alcohol use No     Allergies   Patient has no known allergies.   Review of Systems Review of Systems  All other systems reviewed and are negative.    Physical Exam Updated Vital Signs BP 114/72   Pulse (!) 43   Resp 14   Ht 5\' 8"  (1.727 m)   Wt 179 lb (81.2 kg)   SpO2 100%   BMI 27.22 kg/m   Physical Exam  Nursing note and vitals reviewed.  51 year old male, resting comfortably and in no acute distress. Vital signs are Significant for bradycardia. Oxygen saturation is 100%, which is normal. Head is normocephalic and atraumatic. PERRLA, EOMI. Oropharynx is clear. Neck is nontender and  supple without adenopathy or JVD. Back is nontender and there is no CVA tenderness. Lungs are clear without rales, wheezes, or rhonchi. Chest is nontender. Heart has regular rate and rhythm without murmur. Abdomen is soft, flat, nontender without masses or hepatosplenomegaly and peristalsis is normoactive. Extremities have no cyanosis or edema, full range of motion is present. Skin is warm and dry without rash. Neurologic: Mental status is normal, cranial nerves are intact, there are no motor or sensory deficits.  ED Treatments / Results  Labs (all labs ordered are listed, but only abnormal results are displayed) Labs Reviewed  BASIC METABOLIC PANEL - Abnormal; Notable for the following:       Result Value   Potassium 3.1 (*)    Glucose, Bld 145 (*)    Creatinine, Ser 1.46 (*)    GFR calc non Af Amer 54 (*)    All other components within normal limits  CBC WITH DIFFERENTIAL/PLATELET  I-STAT TROPOININ, ED    EKG  EKG Interpretation  Date/Time:  Wednesday September 21 2016 00:59:09 EST Ventricular Rate:  45 PR Interval:    QRS Duration: 103 QT Interval:  479 QTC Calculation: 415 R Axis:   103 Text Interpretation:  Sinus bradycardia Right axis deviation Consider left ventricular hypertrophy Repol abnrm, severe global ischemia (LM/MVD) No old tracing to compare No criteria for STEMI Confirmed by [facility]  MD,  Suleyman Ehrman ([id number]) on 09/21/2016 1:16:30 AM       Radiology Dg Chest Port 1 View  Result Date: 09/21/2016 CLINICAL DATA:  Mid chest pain and shortness of breath tonight. EXAM: PORTABLE CHEST 1 VIEW COMPARISON:  None. FINDINGS: The heart size and mediastinal contours are within normal limits. Both lungs are clear. The visualized skeletal structures are unremarkable. IMPRESSION: Normal portable chest radiograph. Electronically Signed   By: Elon Alas M.D.   On: 09/21/2016 01:32    Procedures Procedures (including critical care time) CRITICAL CARE Performed by:  [id number] Total critical care time: 50 minutes Critical care time was exclusive of separately billable procedures and treating other patients. Critical care was necessary to treat or prevent imminent or life-threatening deterioration. Critical care was time spent personally by me on the following activities: development of treatment plan with patient and/or surrogate as well as nursing, discussions with consultants, evaluation of patient's response to treatment, examination of patient, obtaining history from patient or surrogate, ordering and performing treatments and interventions, ordering and review of laboratory studies, ordering and review of radiographic studies, pulse oximetry and re-evaluation of patient's condition.  Medications Ordered in ED Medications  heparin injection 4,000 Units (not administered)  aspirin chewable tablet 324 mg (324 mg Oral Given 09/21/16 0115)     Initial Impression / Assessment and Plan / ED Course  I have reviewed the triage vital signs and the nursing notes.  Pertinent labs & imaging results that were available during my care of the patient were reviewed by me and considered in my medical decision making (see chart for details).  Chest pain of uncertain cause. ECG is worrisome for cardiac ischemia, the patient appears to be in no distress whatsoever. There are no old records or old ECGs with which to compare. I have reviewed the ECG with Dr. Burt Knack of cardiology on-call for STEMI who agrees with activation of code STEMI in spite of no clear ECG findings but very worrisome appearing ECG. 2 STEMI is activated and he is given heparin as well as aspirin.  While waiting for cardiology to arrive, patient became hypotensive with blood pressure dropping 79 systolic. Pain seemed to be increasing. He was given IV fluids without any improvement in blood pressure. He was given a dose of atropine. With that, heart rate increased and blood pressure came back up. Dr. Burt Knack  has arrived and is taking him to the catheterization lab.  Final Clinical Impressions(s) / ED Diagnoses   Final diagnoses:  Chest pain, unspecified type  Hypokalemia  Bradycardia  Hypotension, unspecified hypotension type    New Prescriptions New Prescriptions   No medications on file     Delora Fuel, MD [phone]

## 2016-09-21 NOTE — H&P (Signed)
History & Physical    Patient ID: Randy Moore MRN: [id number], DOB/AGE: 51   Admit date: 09/21/2016   Primary Physician: No primary care provider on file. Primary Cardiologist: NA  Patient Profile    51y o man with CP and STEMI  Past Medical History   None   Allergies  No Known Allergies  History of Present Illness    Randy Moore has no known PMH and is only taking zantac for reflux. He developed acute CP at  11pm last night. Pain is retrosternal. He feels cold and is he developed chills. No N/V, no SOB. He reports no unusual stressors over the last days. He is from [place] and he works as a Theme park manager. His family is back in [place].   In the ED he is found to have ST elevations on his ECG. His HR was on the low side (as low as 40) HIs BP started to drop in the ED. He was given 2 L NS and atropin. He was started on heparin and given a full dose ASA after a STEMI alert was called.   Home Medications    Zantac  Family History    Mother with CP  Social History    Social History   Social History  . Marital status: N/A    Spouse name: N/A  . Number of children: N/A  . Years of education: N/A   Occupational History  . Not on file.   Social History Main Topics  . Smoking status: Never Smoker  . Smokeless tobacco: Never Used  . Alcohol use No  . Drug use: No  . Sexual activity: Not on file   Other Topics Concern  . Not on file   Social History Narrative  . No narrative on file     Review of Systems    General:  No chills, fever, night sweats or weight changes.  Cardiovascular:  No chest pain, dyspnea on exertion, edema, orthopnea, palpitations, paroxysmal nocturnal dyspnea. Dermatological: No rash, lesions/masses Respiratory: No cough, dyspnea Urologic: No hematuria, dysuria Abdominal:   No nausea, vomiting, diarrhea, bright red blood per rectum, melena, or hematemesis Neurologic:  No visual changes, wkns, changes in mental status. All other systems  reviewed and are otherwise negative except as noted above.  Physical Exam    Blood pressure (!) 87/69, pulse (!) 39, resp. rate 18, height 5\' 8"  (1.727 m), weight 81.2 kg (179 lb), SpO2 99 %.  General: Pleasant, NAD Psych: Normal affect. Neuro: Alert and oriented X 3. Moves all extremities spontaneously. HEENT: Normal  Neck: Supple without bruits or JVD. Lungs:  Resp regular and unlabored, CTA. Heart: RRR no s3, s4, or murmurs. Abdomen: Soft, non-tender, non-distended, BS + x 4.  Extremities: No clubbing, cyanosis or edema. DP/PT/Radials 2+ and equal bilaterally.  Labs    Troponin [facility] of Care Test)  Recent Labs  09/21/16 0117  TROPIPOC 0.00   No results for input(s): CKTOTAL, CKMB, TROPONINI in the last 72 hours. Lab Results  Component Value Date   WBC 9.6 09/21/2016   HGB 14.4 09/21/2016   HCT 42.1 09/21/2016   MCV 82.1 09/21/2016   PLT 180 09/21/2016    Recent Labs Lab 09/21/16 0110  NA 143  K 3.1*  CL 101  CO2 27  BUN 6  CREATININE 1.46*  CALCIUM 9.0  GLUCOSE 145*   No results found for: CHOL, HDL, LDLCALC, TRIG No results found for: [facility]   Radiology Studies    Dg Chest  Port 1 View  Result Date: 09/21/2016 CLINICAL DATA:  Mid chest pain and shortness of breath tonight. EXAM: PORTABLE CHEST 1 VIEW COMPARISON:  None. FINDINGS: The heart size and mediastinal contours are within normal limits. Both lungs are clear. The visualized skeletal structures are unremarkable. IMPRESSION: Normal portable chest radiograph. Electronically Signed   By: Elon Alas M.D.   On: 09/21/2016 01:32    ECG & Cardiac Imaging    ST elevation in III, AVR, V1. ST depressions in I, V4-V6. Q waves in III, AVF. Rhythm was changing between sinus brady and junctional escape.   Assessment & Plan    Randy Moore has no sign PMH. Now he presents with CP and is found to have a STEMI. Liekly an RV infarct given the clinical presentation and ECG findings.   Plan: - Will go to cath  lab - Will initiate on statin, ASA, P2Y12. Will hold of BB and ACEI for now and start once hemodynamics allow.   Signed, Cristina Gong, MD 09/21/2016, 2:02 AM

## 2016-09-21 NOTE — ED Triage Notes (Signed)
Pt arrived via [facility] from home c/o Chest Pain starting at 11pm.  Pt refuses ASA

## 2016-09-21 NOTE — Care Management Note (Signed)
Case Management Note  Patient Details  Name: Randy Moore MRN: [id number] Date of Birth: 1966-05-05  Subjective/Objective:       Adm w mi             Action/Plan: lives at home   Expected Discharge Date:                  Expected Discharge Plan:  Home/Self Care  In-House Referral:     Discharge planning Services  CM Consult, Medication Assistance  Post Acute Care Choice:    Choice offered to:     DME Arranged:    DME Agency:     HH Arranged:    HH Agency:     Status of Service:  In process, will continue to follow  If discussed at Long Length of Stay Meetings, dates discussed:    Additional Comments:[employer] ins. Gave pt 30day free and copay card for brilinta .  Lacretia Leigh, RN 09/21/2016, 11:26 AM

## 2016-09-21 NOTE — Progress Notes (Signed)
ANTICOAGULATION CONSULT NOTE - Initial Consult  Pharmacy Consult for heparin Indication: chest pain/ACS  No Known Allergies  Patient Measurements: Height: 5\' 8"  (172.7 cm) Weight: 181 lb 10.5 oz (82.4 kg) IBW/kg (Calculated) : 68.4   Vital Signs: Temp: 99.1 F (37.3 C) (01/31 0811) Temp Source: Oral (01/31 0811) BP: 107/73 (01/31 0800) Pulse Rate: 66 (01/31 0800)  Labs:  Recent Labs  09/21/16 0110 09/21/16 0346 09/21/16 0811  HGB 14.4  --  12.7*  HCT 42.1  --  37.5*  PLT 180  --  154  CREATININE 1.46*  --  1.13  TROPONINI  --  2.35* 15.96*    Estimated Creatinine Clearance: 80 mL/min (by C-G formula based on SCr of 1.13 mg/dL).Stable clearance   Medical History: Past Medical History:  Diagnosis Date  . Acute right ventricular myocardial infarction 09/21/2016  . Acute systolic heart failure ([place]) 09/21/2016  . Cardiogenic shock ([place]) 09/21/2016  . Multivessel CAD - 100% RCA (PCI), 99% SubTmLAD, 70-80% RI & dCx 09/21/2016    Assessment: Pt is s/p STEMI with DES placed to RCA, needs to be staged for further PCI intervention or possible CABG for LAD blockage. Tirofiban drip stopped this AM. Pharmacy consulted to initiate heparin gtt. Currently no bleeding, H/H low stable. Plt WNL.  Goal of Therapy:  Heparin level 0.3-0.7 units/ml Monitor platelets by anticoagulation protocol: Yes   Plan:  Heparin gtt 1000 units/hr starting at 1500 on 1/31 F/u 6 hr heparin level Daily CBC/heparin level Monitor for s/sx bleeding  Cheral Almas, PharmD Candidate 09/21/2016,10:31 AM

## 2016-09-21 NOTE — Progress Notes (Signed)
Progress Note  Patient Name: Randy Moore Date of Encounter: 09/21/2016  Primary Cardiologist: Burt Knack  Patient Profile     51 y/o man with no prior PMH presented last yesterday (1/30) with Inferior-Posterior STEMI & Cardiogenic Shock with severe bradycardia. Taken acutely to the CATH Lab  - PCI to 100% pRCA, but with severe MV CAD noted including SubTotal LAD.  Subjective   Feels much better this morning. No further chest pain. No bleeding issues - radial cath site without soreness No problems with breathing  Inpatient Medications    Scheduled Meds: . [START ON 09/22/2016] aspirin EC  81 mg Oral Daily  . atorvastatin  80 mg Oral q1800  . [START ON 09/22/2016] Influenza vac split quadrivalent PF  0.5 mL Intramuscular Tomorrow-1000  . propofol      . sodium chloride flush  3 mL Intravenous Q12H  . ticagrelor  90 mg Oral BID   Continuous Infusions:  PRN Meds: sodium chloride, acetaminophen, hydrALAZINE, labetalol, morphine injection, nitroGLYCERIN, ondansetron (ZOFRAN) IV, sodium chloride flush   Vital Signs    Vitals:   09/21/16 0600 09/21/16 0630 09/21/16 0800 09/21/16 0811  BP: 119/75 118/82 107/73   Pulse: 98 (!) 119 66   Resp: 20 (!) 22 18   Temp:    99.1 F (37.3 C)  TempSrc:    Oral  SpO2: 97% 95% 95%   Weight:      Height:        Intake/Output Summary (Last 24 hours) at 09/21/16 0921 Last data filed at 09/21/16 0800  Gross per 24 hour  Intake           249.42 ml  Output              550 ml  Net          -300.58 ml   Filed Weights   09/21/16 0102 09/21/16 0345  Weight: 81.2 kg (179 lb) 82.4 kg (181 lb 10.5 oz)    Telemetry    Now S Tachy ~100 with intermitted AIVR - Personally Reviewed  ECG     SR 93, ~LVH with diffuse ST-T wave abnormalities - STE no longer present- Personally Reviewed (agree with read)  Physical Exam   GEN: No acute distress.  Resting comfortably in bed Neck: No JVD Cardiac: RRR - borderline tachycardia, no murmurs, rubs, or  gallops.  Respiratory: Clear to auscultation bilaterally. GI: Soft, nontender, non-distended  MS: No edema; No deformity. Neuro:  Nonfocal  Psych: Normal affect   Labs    Chemistry Recent Labs Lab 09/21/16 0110 09/21/16 0811  NA 143 140  K 3.1* 4.1  CL 101 111  CO2 27 25  GLUCOSE 145* 107*  BUN 6 5*  CREATININE 1.46* 1.13  CALCIUM 9.0 8.2*  GFRNONAA 54* >60  GFRAA >60 >60  ANIONGAP 15 4*     Hematology Recent Labs Lab 09/21/16 0110 09/21/16 0811  WBC 9.6 10.7*  RBC 5.13 4.60  HGB 14.4 12.7*  HCT 42.1 37.5*  MCV 82.1 81.5  MCH 28.1 27.6  MCHC 34.2 33.9  RDW 13.1 13.3  PLT 180 154    Cardiac Enzymes Recent Labs Lab 09/21/16 0346 09/21/16 0811  TROPONINI 2.35* 15.96*    Recent Labs Lab 09/21/16 0117  TROPIPOC 0.00     BNPNo results for input(s): BNP, PROBNP in the last 168 hours.   DDimer No results for input(s): DDIMER in the last 168 hours.   Radiology    Dg Chest [place] 1  View  Result Date: 09/21/2016 CLINICAL DATA:  Mid chest pain and shortness of breath tonight. EXAM: PORTABLE CHEST 1 VIEW COMPARISON:  None. FINDINGS: The heart size and mediastinal contours are within normal limits. Both lungs are clear. The visualized skeletal structures are unremarkable. IMPRESSION: Normal portable chest radiograph. Electronically Signed   By: Elon Alas M.D.   On: 09/21/2016 01:32    Cardiac Studies   Cardiac Cath:  EDP ~21-29 mmHg Conclusion   1. Acute inferior wall and RV myocardial infarction complicated by bradycardia and hypotension, treated successfully with primary PCI using a drug-eluting stent 2. Severe multivessel coronary artery disease with subtotal occlusion of the LAD, severe stenosis of the first diagonal, and severe stenosis of the ramus intermedius branch 3. Moderate segmental LV systolic dysfunction with severe elevation of LVEDP  Recommendations: The patient will be loaded with brilinta. Will continue Aggrastat for 6 hours. He  may require diuresis considering his elevated LVEDP. Will need to decide on further revascularization. Burtis Junes an attempted staged PCI of the LAD with aggressive medical therapy. If LAD PCI is unsuccessful would consider CABG.   Diagnostic Diagram        Post-Intervention Diagram                  Assessment & Plan   Principal Problem:   Acute ST elevation myocardial infarction (STEMI) of inferior wall (HCC)/ Acute right ventricular myocardial infarction -->Presence of drug coated stent in right coronary artery  Successful one-vessel vascularization last night restoring flow to the RCA and resolving cardiac shock  Cardiogenic shock ([place]) - resolved. However with recent hypotension, I'm leery of adding beta blocker today. Will need to monitor for any arrhythmias that are sustained.   On aspirin and Brilinta - would likely be on antiplatelet agent lifelong based on the extent of his disease  High-dose statin    Acute combined systolic and diastolic heart failure ([place])  With borderline blood pressure, I'm leery of hypotension with extra diuresis, will use gentle IV Lasix 20 mg twice a day today. 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 I will discuss with Dr. Burt Knack and I think that it would probably be beneficial based on the extent of his MI for [place] to plan potential staged PCI of the LAD as early as tomorrow if not the next day. If the LAD is successfully revascularized, it would be reasonable to proceed with revascularization of the ramus intermedius as well with the distal circumflex as a final potential.    We'll keep him in the ICU today. Aggrastat was stopped, and a TR band is close to being removed. I will start heparin 6 hours a TR band removal for the existing LAD disease.  Okay to be up to the chair, but would prefer not to  have him ambulating in the hallway until more stable.   Signed, Glenetta Hew, MD  09/21/2016, 9:21 AM

## 2016-09-22 ENCOUNTER — Encounter (HOSPITAL_COMMUNITY)
Admission: EM | Disposition: A | Discharge status: Home / Self Care | Payer: Self-pay | Source: Home / Self Care | Attending: Cardiovascular Disease

## 2016-09-22 ENCOUNTER — Inpatient Hospital Stay (HOSPITAL_COMMUNITY): Payer: Managed Care, Other (non HMO)

## 2016-09-22 DIAGNOSIS — I2582 Chronic total occlusion of coronary artery: Secondary | ICD-10-CM

## 2016-09-22 HISTORY — PX: CARDIAC CATHETERIZATION: SHX172

## 2016-09-22 LAB — HEPARIN LEVEL (UNFRACTIONATED): Heparin Unfractionated: 0.39 IU/mL (ref 0.30–0.70)

## 2016-09-22 LAB — PROTIME-INR
INR: 1.34
PROTHROMBIN TIME: 16.7 s — AB (ref 11.4–15.2)

## 2016-09-22 LAB — CBC
HCT: 39.8 % (ref 39.0–52.0)
HEMOGLOBIN: 13.4 g/dL (ref 13.0–17.0)
MCH: 27.5 pg (ref 26.0–34.0)
MCHC: 33.7 g/dL (ref 30.0–36.0)
MCV: 81.7 fL (ref 78.0–100.0)
Platelets: 146 10*3/uL — ABNORMAL LOW (ref 150–400)
RBC: 4.87 MIL/uL (ref 4.22–5.81)
RDW: 13.3 % (ref 11.5–15.5)
WBC: 8 10*3/uL (ref 4.0–10.5)

## 2016-09-22 LAB — HEMOGLOBIN A1C
HEMOGLOBIN A1C: 5.2 % (ref 4.8–5.6)
Mean Plasma Glucose: 103 mg/dL

## 2016-09-22 LAB — POCT ACTIVATED CLOTTING TIME
ACTIVATED CLOTTING TIME: 378 s
Activated Clotting Time: 483 seconds

## 2016-09-22 SURGERY — CORONARY CTO INTERVENTION

## 2016-09-22 MED ORDER — HEPARIN (PORCINE) IN NACL 2-0.9 UNIT/ML-% IJ SOLN
INTRAMUSCULAR | Status: DC | PRN
  Administered 2016-09-22: 1000 mL

## 2016-09-22 MED ORDER — VERAPAMIL HCL 2.5 MG/ML IV SOLN
INTRAVENOUS | Status: AC
  Filled 2016-09-22: qty 2

## 2016-09-22 MED ORDER — IOPAMIDOL (ISOVUE-370) INJECTION 76%
INTRAVENOUS | Status: AC
  Filled 2016-09-22: qty 100

## 2016-09-22 MED ORDER — HEPARIN (PORCINE) IN NACL 2-0.9 UNIT/ML-% IJ SOLN
INTRAMUSCULAR | Status: AC
  Filled 2016-09-22: qty 1000

## 2016-09-22 MED ORDER — SODIUM CHLORIDE 0.9 % IV SOLN: INTRAVENOUS | Status: AC

## 2016-09-22 MED ORDER — VERAPAMIL HCL 2.5 MG/ML IV SOLN
INTRAVENOUS | Status: DC | PRN
  Administered 2016-09-22: 10 mL via INTRA_ARTERIAL

## 2016-09-22 MED ORDER — MIDAZOLAM HCL 2 MG/2ML IJ SOLN
INTRAMUSCULAR | Status: AC
  Filled 2016-09-22: qty 2

## 2016-09-22 MED ORDER — SODIUM CHLORIDE 0.9 % IV SOLN
250.0000 mL | INTRAVENOUS | Status: DC | PRN
Start: 2016-09-22 — End: 2016-09-23

## 2016-09-22 MED ORDER — IOPAMIDOL (ISOVUE-370) INJECTION 76%
INTRAVENOUS | Status: DC | PRN
  Administered 2016-09-22: 145 mL via INTRA_ARTERIAL

## 2016-09-22 MED ORDER — LIDOCAINE HCL (PF) 1 % IJ SOLN
INTRAMUSCULAR | Status: AC
  Filled 2016-09-22: qty 30

## 2016-09-22 MED ORDER — FENTANYL CITRATE (PF) 100 MCG/2ML IJ SOLN
INTRAMUSCULAR | Status: DC | PRN
  Administered 2016-09-22: 50 ug via INTRAVENOUS
  Administered 2016-09-22: 25 ug via INTRAVENOUS

## 2016-09-22 MED ORDER — SODIUM CHLORIDE 0.9% FLUSH: 3.0000 mL | INTRAVENOUS | Status: DC | PRN

## 2016-09-22 MED ORDER — NITROGLYCERIN 1 MG/10 ML FOR IR/CATH LAB
INTRA_ARTERIAL | Status: DC | PRN
  Administered 2016-09-22: 200 ug

## 2016-09-22 MED ORDER — HEPARIN SODIUM (PORCINE) 1000 UNIT/ML IJ SOLN
INTRAMUSCULAR | Status: AC
  Filled 2016-09-22: qty 1

## 2016-09-22 MED ORDER — FENTANYL CITRATE (PF) 100 MCG/2ML IJ SOLN
INTRAMUSCULAR | Status: AC
  Filled 2016-09-22: qty 2

## 2016-09-22 MED ORDER — SODIUM CHLORIDE 0.9% FLUSH
3.0000 mL | Freq: Two times a day (BID) | INTRAVENOUS | Status: DC
Start: 2016-09-22 — End: 2016-09-23
  Administered 2016-09-22 – 2016-09-23 (×2): 3 mL via INTRAVENOUS

## 2016-09-22 MED ORDER — IOPAMIDOL (ISOVUE-370) INJECTION 76%
INTRAVENOUS | Status: AC
  Filled 2016-09-22: qty 125

## 2016-09-22 MED ORDER — NITROGLYCERIN 1 MG/10 ML FOR IR/CATH LAB
INTRA_ARTERIAL | Status: AC
  Filled 2016-09-22: qty 10

## 2016-09-22 MED ORDER — HEPARIN SODIUM (PORCINE) 1000 UNIT/ML IJ SOLN
INTRAMUSCULAR | Status: DC | PRN
  Administered 2016-09-22: 10000 [IU] via INTRAVENOUS
  Administered 2016-09-22: 2000 [IU] via INTRAVENOUS

## 2016-09-22 MED ORDER — MIDAZOLAM HCL 2 MG/2ML IJ SOLN
INTRAMUSCULAR | Status: DC | PRN
  Administered 2016-09-22: 1 mg via INTRAVENOUS
  Administered 2016-09-22: 2 mg via INTRAVENOUS

## 2016-09-22 MED ORDER — LIDOCAINE HCL (PF) 1 % IJ SOLN
INTRAMUSCULAR | Status: DC | PRN
  Administered 2016-09-22: 2 mL via INTRADERMAL

## 2016-09-22 MED ORDER — ANGIOPLASTY BOOK
Freq: Once | Status: AC
  Administered 2016-09-22: 21:00:00
  Filled 2016-09-22: qty 1

## 2016-09-22 MED FILL — Heparin Sodium (Porcine) 2 Unit/ML in Sodium Chloride 0.9%: INTRAMUSCULAR | Qty: 500 | Status: AC

## 2016-09-22 SURGICAL SUPPLY — 18 items
BALLN EUPHORA RX 2.5X15 (BALLOONS) ×3
BALLN ~~LOC~~ EUPHORA RX 3.25X20 (BALLOONS) ×3
BALLOON EUPHORA RX 2.5X15 (BALLOONS) ×1 IMPLANT
BALLOON ~~LOC~~ EUPHORA RX 3.25X20 (BALLOONS) ×1 IMPLANT
CATH VISTA GUIDE 6FR XBLAD3.5 (CATHETERS) ×3 IMPLANT
DEVICE RAD COMP TR BAND LRG (VASCULAR PRODUCTS) ×3 IMPLANT
GLIDESHEATH SLEND SS 6F .021 (SHEATH) ×3 IMPLANT
GUIDEWIRE INQWIRE 1.5J.035X260 (WIRE) ×1 IMPLANT
INQWIRE 1.5J .035X260CM (WIRE) ×3
KIT ENCORE 26 ADVANTAGE (KITS) ×6 IMPLANT
KIT HEART LEFT (KITS) ×3 IMPLANT
PACK CARDIAC CATHETERIZATION (CUSTOM PROCEDURE TRAY) ×3 IMPLANT
STENT PROMUS PREM MR 3.0X28 (Permanent Stent) ×3 IMPLANT
STENT PROMUS PREM MR 3.0X38 (Permanent Stent) ×3 IMPLANT
TRANSDUCER W/STOPCOCK (MISCELLANEOUS) ×3 IMPLANT
TUBING CIL FLEX 10 FLL-RA (TUBING) ×3 IMPLANT
WIRE COUGAR XT STRL 190CM (WIRE) ×3 IMPLANT
WIRE HI TORQ WHISPER MS 190CM (WIRE) ×3 IMPLANT

## 2016-09-22 NOTE — Interval H&P Note (Signed)
History and Physical Interval Note:  09/22/2016 10:13 AM  Randy Moore  has presented today for cardiac cath with the diagnosis of unstable angina, CAD.  The various methods of treatment have been discussed with the patient and family. After consideration of risks, benefits and other options for treatment, the patient has consented to  Procedure(s): Coronary Stent Intervention (N/A) as a surgical intervention .  The patient's history has been reviewed, patient examined, no change in status, stable for surgery.  I have reviewed the patient's chart and labs.  Questions were answered to the patient's satisfaction.    Cath Lab Visit (complete for each Cath Lab visit)  Clinical Evaluation Leading to the Procedure:   ACS: Yes.    Non-ACS:    Anginal Classification: CCS III  Anti-ischemic medical therapy: No Therapy  Non-Invasive Test Results: No non-invasive testing performed  Prior CABG: No previous CABG        Lauree Chandler

## 2016-09-22 NOTE — H&P (View-Only) (Signed)
Progress Note  Patient Name: Randy Moore Date of Encounter: 09/21/2016  Primary Cardiologist: Burt Knack  Patient Profile     51 y/o man with no prior PMH presented last yesterday (1/30) with Inferior-Posterior STEMI & Cardiogenic Shock with severe bradycardia. Taken acutely to the CATH Lab  - PCI to 100% pRCA, but with severe MV CAD noted including SubTotal LAD.  Subjective   Feels much better this morning. No further chest pain. No bleeding issues - radial cath site without soreness No problems with breathing  Inpatient Medications    Scheduled Meds: . [START ON 09/22/2016] aspirin EC  81 mg Oral Daily  . atorvastatin  80 mg Oral q1800  . [START ON 09/22/2016] Influenza vac split quadrivalent PF  0.5 mL Intramuscular Tomorrow-1000  . propofol      . sodium chloride flush  3 mL Intravenous Q12H  . ticagrelor  90 mg Oral BID   Continuous Infusions:  PRN Meds: sodium chloride, acetaminophen, hydrALAZINE, labetalol, morphine injection, nitroGLYCERIN, ondansetron (ZOFRAN) IV, sodium chloride flush   Vital Signs    Vitals:   09/21/16 0600 09/21/16 0630 09/21/16 0800 09/21/16 0811  BP: 119/75 118/82 107/73   Pulse: 98 (!) 119 66   Resp: 20 (!) 22 18   Temp:    99.1 F (37.3 C)  TempSrc:    Oral  SpO2: 97% 95% 95%   Weight:      Height:        Intake/Output Summary (Last 24 hours) at 09/21/16 0921 Last data filed at 09/21/16 0800  Gross per 24 hour  Intake           249.42 ml  Output              550 ml  Net          -300.58 ml   Filed Weights   09/21/16 0102 09/21/16 0345  Weight: 81.2 kg (179 lb) 82.4 kg (181 lb 10.5 oz)    Telemetry    Now S Tachy ~100 with intermitted AIVR - Personally Reviewed  ECG     SR 93, ~LVH with diffuse ST-T wave abnormalities - STE no longer present- Personally Reviewed (agree with read)  Physical Exam   GEN: No acute distress.  Resting comfortably in bed Neck: No JVD Cardiac: RRR - borderline tachycardia, no murmurs, rubs, or  gallops.  Respiratory: Clear to auscultation bilaterally. GI: Soft, nontender, non-distended  MS: No edema; No deformity. Neuro:  Nonfocal  Psych: Normal affect   Labs    Chemistry Recent Labs Lab 09/21/16 0110 09/21/16 0811  NA 143 140  K 3.1* 4.1  CL 101 111  CO2 27 25  GLUCOSE 145* 107*  BUN 6 5*  CREATININE 1.46* 1.13  CALCIUM 9.0 8.2*  GFRNONAA 54* >60  GFRAA >60 >60  ANIONGAP 15 4*     Hematology Recent Labs Lab 09/21/16 0110 09/21/16 0811  WBC 9.6 10.7*  RBC 5.13 4.60  HGB 14.4 12.7*  HCT 42.1 37.5*  MCV 82.1 81.5  MCH 28.1 27.6  MCHC 34.2 33.9  RDW 13.1 13.3  PLT 180 154    Cardiac Enzymes Recent Labs Lab 09/21/16 0346 09/21/16 0811  TROPONINI 2.35* 15.96*    Recent Labs Lab 09/21/16 0117  TROPIPOC 0.00     BNPNo results for input(s): BNP, PROBNP in the last 168 hours.   DDimer No results for input(s): DDIMER in the last 168 hours.   Radiology    Dg Chest [place] 1  View  Result Date: 09/21/2016 CLINICAL DATA:  Mid chest pain and shortness of breath tonight. EXAM: PORTABLE CHEST 1 VIEW COMPARISON:  None. FINDINGS: The heart size and mediastinal contours are within normal limits. Both lungs are clear. The visualized skeletal structures are unremarkable. IMPRESSION: Normal portable chest radiograph. Electronically Signed   By: Elon Alas M.D.   On: 09/21/2016 01:32    Cardiac Studies   Cardiac Cath:  EDP ~21-29 mmHg Conclusion   1. Acute inferior wall and RV myocardial infarction complicated by bradycardia and hypotension, treated successfully with primary PCI using a drug-eluting stent 2. Severe multivessel coronary artery disease with subtotal occlusion of the LAD, severe stenosis of the first diagonal, and severe stenosis of the ramus intermedius branch 3. Moderate segmental LV systolic dysfunction with severe elevation of LVEDP  Recommendations: The patient will be loaded with brilinta. Will continue Aggrastat for 6 hours. He  may require diuresis considering his elevated LVEDP. Will need to decide on further revascularization. Burtis Junes an attempted staged PCI of the LAD with aggressive medical therapy. If LAD PCI is unsuccessful would consider CABG.   Diagnostic Diagram        Post-Intervention Diagram                  Assessment & Plan   Principal Problem:   Acute ST elevation myocardial infarction (STEMI) of inferior wall (HCC)/ Acute right ventricular myocardial infarction -->Presence of drug coated stent in right coronary artery  Successful one-vessel vascularization last night restoring flow to the RCA and resolving cardiac shock  Cardiogenic shock ([place]) - resolved. However with recent hypotension, I'm leery of adding beta blocker today. Will need to monitor for any arrhythmias that are sustained.   On aspirin and Brilinta - would likely be on antiplatelet agent lifelong based on the extent of his disease  High-dose statin    Acute combined systolic and diastolic heart failure ([place])  With borderline blood pressure, I'm leery of hypotension with extra diuresis, will use gentle IV Lasix 20 mg twice a day today. 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 I will discuss with Dr. Burt Knack and I think that it would probably be beneficial based on the extent of his MI for [place] to plan potential staged PCI of the LAD as early as tomorrow if not the next day. If the LAD is successfully revascularized, it would be reasonable to proceed with revascularization of the ramus intermedius as well with the distal circumflex as a final potential.    We'll keep him in the ICU today. Aggrastat was stopped, and a TR band is close to being removed. I will start heparin 6 hours a TR band removal for the existing LAD disease.  Okay to be up to the chair, but would prefer not to  have him ambulating in the hallway until more stable.   Signed, Glenetta Hew, MD  09/21/2016, 9:21 AM

## 2016-09-22 NOTE — Progress Notes (Signed)
S/W  MICHELLE @ [facility] # [phone]   BRILINTA  90 MG BID   COVER- YES  CO-PAY- $ 359.11 100 % OF COAST  TIER- 2 DRUG  PRIOR APPROVAL- NO  NO DEDUCTIBLE    PREFERRED :  CLOPIDOGREL 75 MG   COVER- YES  CO-PAY- ZERO DOLLARS  PRIOR APPROVAL-NO   PHARMACY : ANY RETAIL

## 2016-09-22 NOTE — Progress Notes (Signed)
TR BAND REMOVAL  LOCATION:    right radial  DEFLATED PER PROTOCOL:    Yes.    TIME BAND OFF / DRESSING APPLIED:    1515   SITE UPON ARRIVAL:    Level 0  SITE AFTER BAND REMOVAL:    Level 0  CIRCULATION SENSATION AND MOVEMENT:    Within Normal Limits   Yes.    COMMENTS:   Tolerated procedure well 

## 2016-09-22 NOTE — Progress Notes (Signed)
ANTICOAGULATION CONSULT NOTE  Pharmacy Consult for heparin Indication: CAD, awaiting staged procedure  No Known Allergies  Patient Measurements: Height: 5\' 8"  (172.7 cm) Weight: 182 lb 1.6 oz (82.6 kg) IBW/kg (Calculated) : 68.4   Vital Signs: Temp: 98 F (36.7 C) (02/01 0400) Temp Source: Oral (02/01 0400) BP: 129/89 (02/01 0600) Pulse Rate: 67 (02/01 0600)  Labs:  Recent Labs  09/21/16 0110 09/21/16 0346 09/21/16 0811 09/21/16 1444 09/21/16 2045 09/22/16 0200  HGB 14.4  --  12.7*  --   --  13.4  HCT 42.1  --  37.5*  --   --  39.8  PLT 180  --  154  --   --  146*  LABPROT  --   --   --   --   --  16.7*  INR  --   --   --   --   --  1.34  HEPARINUNFRC  --   --   --   --  0.35 0.39  CREATININE 1.46*  --  1.13  --   --   --   TROPONINI  --  2.35* 15.96* 20.68*  --   --     Estimated Creatinine Clearance: 80.1 mL/min (by C-G formula based on SCr of 1.13 mg/dL).Stable clearance   Medical History: Past Medical History:  Diagnosis Date  . Acute right ventricular myocardial infarction 09/21/2016  . Acute systolic heart failure ([place]) 09/21/2016  . Cardiogenic shock ([place]) 09/21/2016  . Multivessel CAD - 100% RCA (PCI), 99% SubTmLAD, 70-80% RI & dCx 09/21/2016    Assessment: Pt is s/p STEMI with DES placed to RCA, needs to be staged for further PCI intervention or possible CABG for LAD blockage. Tirofiban drip stopped 1/31. Currently no bleeding, CBC stable.  Heparin level remains in goal range this AM, no bleeding or complications noted.  Goal of Therapy:  Heparin level 0.3-0.7 units/ml Monitor platelets by anticoagulation protocol: Yes   Plan:  Continue heparin 1000 units/hr F/u plan for next procedure. Daily CBC/heparin level Monitor for s/sx bleeding  Uvaldo Rising, BCPS  Clinical Pharmacist Pager [phone]  09/22/2016 7:55 AM

## 2016-09-22 NOTE — Care Management Note (Signed)
Case Management Note  Patient Details  Name: Lenell Mazmanian MRN: [id number] Date of Birth: 03-31-1966  Subjective/Objective:  S/p stent intervention, will be on brilinta, co pa is too high 359.11, NCM informed patient that RN will let his MD know and MD may want him to take the 30 day free and then change him to something else at his follow up apt.  Patient states ok.  He goes to [facility] on [address], NCM called they only had 30 pills.  NCM informed patient to go to [facility] on [address] to get the 30 day free.  NCM will cont to follow for dc needs.                  Action/Plan:   Expected Discharge Date:                  Expected Discharge Plan:  Home/Self Care  In-House Referral:     Discharge planning Services  CM Consult, Medication Assistance  Post Acute Care Choice:    Choice offered to:     DME Arranged:    DME Agency:     HH Arranged:    HH Agency:     Status of Service:  Completed, signed off  If discussed at [employer] of Stay Meetings, dates discussed:    Additional Comments:  Zenon Mayo, RN 09/22/2016, 4:56 PM

## 2016-09-23 ENCOUNTER — Telehealth: Payer: Self-pay | Admitting: Cardiovascular Disease

## 2016-09-23 ENCOUNTER — Encounter (HOSPITAL_COMMUNITY): Payer: Self-pay | Admitting: Cardiovascular Disease

## 2016-09-23 ENCOUNTER — Inpatient Hospital Stay (HOSPITAL_COMMUNITY): Payer: Managed Care, Other (non HMO)

## 2016-09-23 DIAGNOSIS — I25110 Atherosclerotic heart disease of native coronary artery with unstable angina pectoris: Secondary | ICD-10-CM

## 2016-09-23 DIAGNOSIS — R9431 Abnormal electrocardiogram [ECG] [EKG]: Secondary | ICD-10-CM

## 2016-09-23 DIAGNOSIS — I255 Ischemic cardiomyopathy: Secondary | ICD-10-CM

## 2016-09-23 DIAGNOSIS — Z95820 Peripheral vascular angioplasty status with implants and grafts: Secondary | ICD-10-CM

## 2016-09-23 HISTORY — DX: Ischemic cardiomyopathy: I25.5

## 2016-09-23 HISTORY — DX: Peripheral vascular angioplasty status with implants and grafts: Z95.820

## 2016-09-23 LAB — CBC
HEMATOCRIT: 39.5 % (ref 39.0–52.0)
HEMOGLOBIN: 13.4 g/dL (ref 13.0–17.0)
MCH: 27.8 pg (ref 26.0–34.0)
MCHC: 33.9 g/dL (ref 30.0–36.0)
MCV: 82 fL (ref 78.0–100.0)
Platelets: 136 10*3/uL — ABNORMAL LOW (ref 150–400)
RBC: 4.82 MIL/uL (ref 4.22–5.81)
RDW: 13 % (ref 11.5–15.5)
WBC: 5.5 10*3/uL (ref 4.0–10.5)

## 2016-09-23 LAB — BASIC METABOLIC PANEL
Anion gap: 9 (ref 5–15)
BUN: 7 mg/dL (ref 6–20)
CHLORIDE: 107 mmol/L (ref 101–111)
CO2: 25 mmol/L (ref 22–32)
CREATININE: 0.94 mg/dL (ref 0.61–1.24)
Calcium: 8.5 mg/dL — ABNORMAL LOW (ref 8.9–10.3)
GFR calc non Af Amer: >60 mL/min (ref >60)
Glucose, Bld: 103 mg/dL — ABNORMAL HIGH (ref 65–99)
POTASSIUM: 3.7 mmol/L (ref 3.5–5.1)
SODIUM: 141 mmol/L (ref 135–145)

## 2016-09-23 LAB — ECHOCARDIOGRAM COMPLETE
HEIGHTINCHES: 68 in
Weight: 3026.47 oz

## 2016-09-23 MED ORDER — ACETAMINOPHEN 325 MG PO TABS: 650.0000 mg | ORAL_TABLET | ORAL | Status: DC | PRN

## 2016-09-23 MED ORDER — ATORVASTATIN CALCIUM 80 MG PO TABS: 80.0000 mg | ORAL_TABLET | Freq: Every day | ORAL | 6 refills | Status: DC

## 2016-09-23 MED ORDER — ASPIRIN 81 MG PO TBEC: 81.0000 mg | DELAYED_RELEASE_TABLET | Freq: Every day | ORAL | Status: DC

## 2016-09-23 MED ORDER — TICAGRELOR 90 MG PO TABS: 90.0000 mg | ORAL_TABLET | Freq: Two times a day (BID) | ORAL | 11 refills | Status: DC

## 2016-09-23 MED ORDER — NITROGLYCERIN 0.4 MG SL SUBL: 0.4000 mg | SUBLINGUAL_TABLET | SUBLINGUAL | 4 refills | Status: DC | PRN

## 2016-09-23 MED ORDER — CARVEDILOL 3.125 MG PO TABS
3.1250 mg | ORAL_TABLET | Freq: Two times a day (BID) | ORAL | Status: DC
  Administered 2016-09-23 (×2): 3.125 mg via ORAL
  Filled 2016-09-23 (×2): qty 1

## 2016-09-23 MED ORDER — LOSARTAN POTASSIUM 50 MG PO TABS
25.0000 mg | ORAL_TABLET | Freq: Every day | ORAL | Status: DC
  Administered 2016-09-23: 17:00:00 25 mg via ORAL
  Filled 2016-09-23: qty 1

## 2016-09-23 MED ORDER — LOSARTAN POTASSIUM 25 MG PO TABS: 25.0000 mg | ORAL_TABLET | Freq: Every day | ORAL | 11 refills | Status: DC

## 2016-09-23 MED ORDER — TICAGRELOR 90 MG PO TABS: 90.0000 mg | ORAL_TABLET | Freq: Two times a day (BID) | ORAL | 0 refills | Status: DC

## 2016-09-23 MED ORDER — CARVEDILOL 3.125 MG PO TABS: 3.1250 mg | ORAL_TABLET | Freq: Two times a day (BID) | ORAL | 6 refills | Status: DC

## 2016-09-23 NOTE — Progress Notes (Signed)
Progress Note  Patient Name: Randy Moore Date of Encounter: 09/23/2016  Primary Cardiologist: Burt Knack  Patient Profile     51 y/o man with no prior PMH presented last yesterday (1/30) with Inferior-Posterior STEMI & Cardiogenic Shock with severe bradycardia. Taken acutely to the CATH Lab  - PCI to 100% pRCA, but with severe MV CAD noted including SubTotal LAD.  Was stable the following day after his MI and then was taken back to cardiac catheterization lab on February 1 for staged PCI.  Subjective   Does great today. Is yet to walk around the hallway, but has not had any further chest pain, dyspnea or irregular heartbeats.  Inpatient Medications    Scheduled Meds: . aspirin EC  81 mg Oral Daily  . atorvastatin  80 mg Oral q1800  . carvedilol  3.125 mg Oral BID WC  . Influenza vac split quadrivalent PF  0.5 mL Intramuscular Tomorrow-1000  . sodium chloride flush  3 mL Intravenous Q12H  . sodium chloride flush  3 mL Intravenous Q12H  . ticagrelor  90 mg Oral BID   Continuous Infusions:  PRN Meds: sodium chloride, sodium chloride, acetaminophen, morphine injection, nitroGLYCERIN, ondansetron (ZOFRAN) IV, sodium chloride flush, sodium chloride flush   Vital Signs    Vitals:   09/22/16 1917 09/22/16 2000 09/23/16 0418 09/23/16 0809  BP: (!) 146/99 (!) 142/83 (!) 148/93 (!) 145/92  Pulse: 73 76 66 71  Resp: 13 14 13 14   Temp: 98 F (36.7 C)  98 F (36.7 C) 98.1 F (36.7 C)  TempSrc: Oral  Axillary Oral  SpO2: 100% 100% 100% 100%  Weight:   85.8 kg (189 lb 2.5 oz)   Height:        Intake/Output Summary (Last 24 hours) at 09/23/16 0825 Last data filed at 09/23/16 0700  Gross per 24 hour  Intake           791.25 ml  Output             1850 ml  Net         -1058.75 ml   Filed Weights   09/21/16 0345 09/22/16 0500 09/23/16 0418  Weight: 82.4 kg (181 lb 10.5 oz) 82.6 kg (182 lb 1.6 oz) 85.8 kg (189 lb 2.5 oz)    Telemetry    Normal sinus rhythm with occasional  sinus tachycardia and no further AIVR - Personally Reviewed  ECG     SR 93, ~LVH with diffuse ST-T wave abnormalities - STE no longer present- Personally Reviewed (agree with read)  Physical Exam   GEN: No acute distress.  Resting comfortably in bed Neck: No JVD Cardiac: RRR, no murmurs, rubs, or gallops. Cardiac cath site clean dry and intact Respiratory: Clear to auscultation bilaterally. GI: Soft, nontender, non-distended  MS: No edema; No deformity. Neuro:  Nonfocal  Psych: Normal affect   Labs    Chemistry  Recent Labs Lab 09/21/16 0110 09/21/16 0811 09/23/16 0212  NA 143 140 141  K 3.1* 4.1 3.7  CL 101 111 107  CO2 27 25 25   GLUCOSE 145* 107* 103*  BUN 6 5* 7  CREATININE 1.46* 1.13 0.94  CALCIUM 9.0 8.2* 8.5*  GFRNONAA 54* >60 >60  GFRAA >60 >60 >60  ANIONGAP 15 4* 9     Hematology  Recent Labs Lab 09/21/16 0811 09/22/16 0200 09/23/16 0212  WBC 10.7* 8.0 5.5  RBC 4.60 4.87 4.82  HGB 12.7* 13.4 13.4  HCT 37.5* 39.8 39.5  MCV 81.5  81.7 82.0  MCH 27.6 27.5 27.8  MCHC 33.9 33.7 33.9  RDW 13.3 13.3 13.0  PLT 154 146* 136*    Cardiac Enzymes  Recent Labs Lab 09/21/16 0346 09/21/16 0811 09/21/16 1444  TROPONINI 2.35* 15.96* 20.68*     Recent Labs Lab 09/21/16 0117  TROPIPOC 0.00     BNPNo results for input(s): BNP, PROBNP in the last 168 hours.   DDimer No results for input(s): DDIMER in the last 168 hours.   Radiology    No results found.  Cardiac Studies   Cardiac Cath:  EDP ~21-29 mmHg Conclusion   1. Acute inferior wall and RV myocardial infarction complicated by bradycardia and hypotension, treated successfully with primary PCI using a drug-eluting stent 2. Severe multivessel coronary artery disease with subtotal occlusion of the LAD, severe stenosis of the first diagonal, and severe stenosis of the ramus intermedius branch 3. Moderate segmental LV systolic dysfunction with severe elevation of LVEDP  Recommendations: The  patient will be loaded with brilinta. Will continue Aggrastat for 6 hours. He may require diuresis considering his elevated LVEDP. Will need to decide on further revascularization. Burtis Junes an attempted staged PCI of the LAD with aggressive medical therapy. If LAD PCI is unsuccessful would consider CABG.   Diagnostic Diagram        Post-Intervention Diagram                09/22/2016: Staged PCI CTO PCI  Ostial ramus 60% followed proximal 80%--> Promus Premier DES 3.0 mm x 38 mm  Mid LAD subtotal CTO: Promus Premier DES 3.0 mm x 28 mm  Plan for D2 90% and distal circumflex lesions is medical management  Post-Intervention Diagram      Assessment & Plan   Principal Problem:   Acute ST elevation myocardial infarction (STEMI) of inferior wall (HCC)/ Acute right ventricular myocardial infarction -->Presence of drug coated stent in right coronary artery  Successful one-vessel vascularization last night restoring flow to the RCA and resolving cardiac shock - no further pain with reperfusion arrhythmias noted.  Cardiogenic shock (HCC) - resolved. -> Now will start low-dose beta blocker carvedilol 3.125 twice a day  On aspirin and Brilinta - would likely be on antiplatelet agent lifelong based on the extent of his disease -> co-pay for Brilinta will be quite expensive, therefore after 30 days of free provided Brilinta, we will likely switch to Plavix  High-dose statin    Acute combined systolic and diastolic heart failure ([place]) - echo cardiac exam still pending EF by cath was 45-50%. I suspect this will significantly improve following intervention.  No further heart failure symptoms.  Her now will hold off on Lasix for discharge. - We'll likely be able to titrate up carvedilol and potentially add ARB as outpatient     Multivessel CAD - 100% RCA (PCI), 99% SubTmLAD, 70-80% RI & dCx  Significant residual disease involving the LAD and ramus intermedius at least. I will discuss with Dr. Burt Knack  and I felt that it was  beneficial based on the extent of his MI to plan potential staged PCI of the LAD and ramus intermedius.=> This was performed yesterday on February 1 - with successful CTO PCI of the mid LAD and PCI of the ostial-proximal RI with DES    I would like to have an echo cardiac exam prior to discharge, but not mandatory. This can be done as an outpatient if necessary. - Hopefully will be done prior to discharge for completion sake.  Plan today will be for him to walk in the hallway to see how he does, but otherwise I anticipate that he can be discharged later onset noon. We will need outpatient cardiac rehabilitation established as well as close follow-up with Dr. Antionette Char team.    Signed, Glenetta Hew, MD  09/23/2016, 8:25 AM

## 2016-09-23 NOTE — Progress Notes (Signed)
  Echocardiogram 2D Echocardiogram has been performed.  Randy Moore 09/23/2016, 11:25 AM

## 2016-09-23 NOTE — Discharge Summary (Signed)
Discharge Summary    Patient ID: Randy Moore,  MRN: [id number], DOB/AGE: 12-05-1965 51 y.o.  Admit date: 09/21/2016 Discharge date: 09/23/2016  Primary Care Provider: No primary care provider on file. Primary Cardiologist: Burt Knack  Discharge Diagnoses    Principal Problem:   Acute ST elevation myocardial infarction (STEMI) of inferior wall [facility]) Active Problems:   Acute right ventricular myocardial infarction   Acute combined systolic and diastolic heart failure (HCC)   Cardiogenic shock (HCC)   Coronary artery disease involving native coronary artery of native heart with unstable angina pectoris (HCC)   Presence of drug coated stent in right coronary artery   S/P angioplasty with stent, LAD and Ramus with DES 09/22/16   Cardiomyopathy ([employer])   Allergies No Known Allergies  Diagnostic Studies/Procedures    09/21/16  Cardiac cath by Dr. Burt Knack and PCI to RCA Procedures   Coronary Stent Intervention  Left Heart Cath and Coronary Angiography  Conclusion   1. Acute inferior wall and RV myocardial infarction complicated by bradycardia and hypotension, treated successfully with primary PCI using a drug-eluting stent 2. Severe multivessel coronary artery disease with subtotal occlusion of the LAD, severe stenosis of the first diagonal, and severe stenosis of the ramus intermedius branch 3. Moderate segmental LV systolic dysfunction with severe elevation of LVEDP  Recommendations: The patient will be loaded with brilinta. Will continue Aggrastat for 6 hours. He may require diuresis considering his elevated LVEDP. Will need to decide on further revascularization. Burtis Junes an attempted staged PCI of the LAD with aggressive medical therapy. If LAD PCI is unsuccessful would consider CABG.   Procedures    CTO intervention by Dr. Angelena Form 09/22/16  to Ramus lesion and to mLAD.  Coronary CTO Intervention  Conclusion     Mid Cx lesion, 50 %stenosed.  Ost Ramus to Ramus lesion, 60  %stenosed.  A STENT PROMUS PREM MR 3.0X28 drug eluting stent was successfully placed.  Ramus lesion, 80 %stenosed.  Post intervention, there is a 0% residual stenosis.  A STENT PROMUS PREM MR 3.0X38 drug eluting stent was successfully placed.  Mid LAD lesion, 100 %stenosed.  Post intervention, there is a 0% residual stenosis.  Ost 2nd Diag lesion, 80 %stenosed.  Post intervention, there is a 80% residual stenosis.  Ost LAD to Mid LAD lesion, 40 %stenosed.  Ost Cx to Prox Cx lesion, 30 %stenosed.   1. Chronic total occlusion mid LAD 2. Successful PTCA/DES x 1 mid LAD 3. Severe stenosis ramus intermediate branch 4. Successful PTCA/DES x 1 intermediate branch  Recommendations: Continue ASA and Brilinta for one year. Continue beta blocker and statin.     ECHO  09/23/16   Study Conclusions  - Left ventricle: The cavity size was normal. Systolic function was   moderately reduced. The estimated ejection fraction was in the   range of 35% to 40%. There is hypokinesis of the basal-mid   inferoseptal myocardium and akinesis of the apical septal and   apical myocardium. There is akinesis of the apicalanterior and   inferior myocardium. There is akinesis of the   basal-midanteroseptal myocardium. There was an increased relative   contribution of atrial contraction to ventricular filling.   Doppler parameters are consistent with abnormal left ventricular   relaxation (grade 1 diastolic dysfunction).    _____________   History of Present Illness     51 yom presented to ER 09/21/16 with chest pain that began at 2300.  He was cold and had chills.  He is  from [place] and his family lives there.  In ER he had ST elevation and SB in the 40s. He became hypotensive in ER. He was given IV fluids and atropine.  IV heparin and full dose ASA and STEMI code was called and pt went to cath lab emergently.   Initial troponin poc was 0.00, K+ 3.1   Hospital Course     Consultants: none  In  the lab with acute inf wall MI and RV MI pt had 100% stenosis of RCA undergoing emergent PCI with DES.  EF was 45-50%.  Also found to have residual CAD with subtotal occl of the LAD and severe stenosis of 1st diag. And severe stenosis of the ramus intermedius branch.   Plans were to load with Brilinta and staged PCI of the LAD and possible ramus.   The next AM pt was stable. Troponin pk to 20.68.  K+ was replaced.  Kidney function was stable and plans for staged procedure for 09/22/16.  He underwent successfully both the mLAD and Ramus.    He has been seen by Dr. Ellyn Hack and echo ordered.  He has been seen by cardiac rehab.    Concern he cannot afford his brilinta.  If he cannot then on outpt visit after 30 days of brilinta change to plavix 75 mg daily with 300 mg load-per Dr. Ellyn Hack.     Pt was seen and evaluated by Dr. Ellyn Hack and found stable for discharge.  He will be TOC for 5-7 day OV.      Echo prior to discharge with EF 35-40% - losartan was started.  Will adjust meds in the office. _____________  Discharge Vitals Blood pressure (!) 135/94, pulse 72, temperature 97.8 F (36.6 C), temperature source Oral, resp. rate 16, height 5\' 8"  (1.727 m), weight 189 lb 2.5 oz (85.8 kg), SpO2 100 %.  Filed Weights   09/21/16 0345 09/22/16 0500 09/23/16 0418  Weight: 181 lb 10.5 oz (82.4 kg) 182 lb 1.6 oz (82.6 kg) 189 lb 2.5 oz (85.8 kg)    Labs & Radiologic Studies    CBC  Recent Labs  09/21/16 0110  09/22/16 0200 09/23/16 0212  WBC 9.6  < > 8.0 5.5  NEUTROABS 5.0  --   --   --   HGB 14.4  < > 13.4 13.4  HCT 42.1  < > 39.8 39.5  MCV 82.1  < > 81.7 82.0  PLT 180  < > 146* 136*  < > = values in this interval not displayed. Basic Metabolic Panel  Recent Labs  09/21/16 0811 09/23/16 0212  NA 140 141  K 4.1 3.7  CL 111 107  CO2 25 25  GLUCOSE 107* 103*  BUN 5* 7  CREATININE 1.13 0.94  CALCIUM 8.2* 8.5*   Liver Function Tests No results for input(s): AST, ALT, ALKPHOS,  BILITOT, PROT, ALBUMIN in the last 72 hours. No results for input(s): LIPASE, AMYLASE in the last 72 hours. Cardiac Enzymes  Recent Labs  09/21/16 0346 09/21/16 0811 09/21/16 1444  TROPONINI 2.35* 15.96* 20.68*   BNP Invalid input(s): POCBNP D-Dimer No results for input(s): DDIMER in the last 72 hours. Hemoglobin A1C  Recent Labs  09/21/16 0346  HGBA1C 5.2   Fasting Lipid Panel  Recent Labs  09/21/16 0346  CHOL 136  HDL 38*  LDLCALC 92  TRIG 32  CHOLHDL 3.6   Thyroid Function Tests  Recent Labs  09/21/16 0346  TSH 2.579   _____________  Dg Chest [facility]  1 View  Result Date: 09/21/2016 CLINICAL DATA:  Mid chest pain and shortness of breath tonight. EXAM: PORTABLE CHEST 1 VIEW COMPARISON:  None. FINDINGS: The heart size and mediastinal contours are within normal limits. Both lungs are clear. The visualized skeletal structures are unremarkable. IMPRESSION: Normal portable chest radiograph. Electronically Signed   By: Elon Alas M.D.   On: 09/21/2016 01:32   Disposition   Pt is being discharged home today in good condition.  Follow-up Plans & Appointments  Call [facility] at [phone] if any bleeding, swelling or drainage at cath site.  May shower, no tub baths for 48 hours for groin sticks. No lifting over 5 pounds for 7 days.  No Driving for 5 days  Take 1 NTG, under your tongue, while sitting.  If no relief of pain may repeat NTG, one tab every 5 minutes up to 3 tablets total over 15 minutes.  If no relief CALL 911.  If you have dizziness/lightheadness  while taking NTG, stop taking and call 911.         Heart Healthy Low salt diet  Weigh daily Call [phone] if weight climbs more than 3 pounds in a day or 5 pounds in a week. No salt to very little salt in your diet.  No more than 2000 mg in a day. Call if increased shortness of breath or increased swelling.  Do Not stop Brilinta, if you have any trouble in obtaining then  call our office but do not stop that or the asprin.  It is to keep the stents open.        Follow-up Information    Sherren Mocha, MD Follow up on 09/30/2016.   Specialty:  Cardiology Why:  at 8:45 AM with his PA [place] information: [id number] [address] [place] [address] [phone]          Discharge Instructions    Amb Referral to Cardiac Rehabilitation    Complete by:  As directed    Diagnosis:   Coronary Stents PTCA STEMI        Discharge Medications   Current Discharge Medication List    START taking these medications   Details  acetaminophen (TYLENOL) 325 MG tablet Take 2 tablets (650 mg total) by mouth every 4 (four) hours as needed for headache or mild pain.    aspirin EC 81 MG EC tablet Take 1 tablet (81 mg total) by mouth daily.    atorvastatin (LIPITOR) 80 MG tablet Take 1 tablet (80 mg total) by mouth daily at 6 PM. Qty: 30 tablet, Refills: 6    carvedilol (COREG) 3.125 MG tablet Take 1 tablet (3.125 mg total) by mouth 2 (two) times daily with a meal. Qty: 60 tablet, Refills: 6    losartan (COZAAR) 25 MG tablet Take 1 tablet (25 mg total) by mouth daily. Qty: 30 tablet, Refills: 11    nitroGLYCERIN (NITROSTAT) 0.4 MG SL tablet Place 1 tablet (0.4 mg total) under the tongue every 5 (five) minutes x 3 doses as needed for chest pain. Qty: 25 tablet, Refills: 4    ticagrelor (BRILINTA) 90 MG TABS tablet Take 1 tablet (90 mg total) by mouth 2 (two) times daily. Qty: 60 tablet, Refills: 0      CONTINUE these medications which have NOT CHANGED   Details  ranitidine (ZANTAC) 150 MG tablet Take 150 mg by mouth daily as needed for heartburn.         Aspirin  prescribed at discharge?  Yes High Intensity Statin Prescribed? (Lipitor 40-80mg  or Crestor 20-40mg ): Yes Beta Blocker Prescribed? Yes For EF <40%, was ACEI/ARB Prescribed? Yes ADP Receptor Inhibitor Prescribed? (i.e. Plavix etc.-Includes Medically Managed Patients):  Yes For EF <40%, Aldosterone Inhibitor Prescribed? No:  Will do in office, after recheck labs on ARB Was EF assessed during THIS hospitalization? Yes Was Cardiac Rehab II ordered? (Included Medically managed Patients): Yes   Outstanding Labs/Studies   MAY NEED TO CHANGE TO PLAVIX  Check BMP on Visit after starting ARB and may need to add spironolactone   Duration of Discharge Encounter   Greater than 30 minutes including physician time.  Signed, Cecilie Kicks NP 09/23/2016, 4:14 PM

## 2016-09-23 NOTE — Telephone Encounter (Signed)
New message    TOC 5-7 days per Cecilie Kicks. Appt on 09/30/16 8:45am with [name].

## 2016-09-23 NOTE — Discharge Instructions (Signed)
Call [facility] at [phone] if any bleeding, swelling or drainage at cath site.  May shower, no tub baths for 48 hours for groin sticks. No lifting over 5 pounds for 7 days.  No Driving for 5 days  Take 1 NTG, under your tongue, while sitting.  If no relief of pain may repeat NTG, one tab every 5 minutes up to 3 tablets total over 15 minutes.  If no relief CALL 911.  If you have dizziness/lightheadness  while taking NTG, stop taking and call 911.         Heart Healthy Low salt diet  Weigh daily Call [phone] if weight climbs more than 3 pounds in a day or 5 pounds in a week. No salt to very little salt in your diet.  No more than 2000 mg in a day. Call if increased shortness of breath or increased swelling.  Do Not stop Brilinta, if you have any trouble in obtaining then call our office but do not stop that or the asprin.  It is to keep the stents open.

## 2016-09-23 NOTE — Progress Notes (Signed)
CARDIAC REHAB PHASE I   PRE:  Rate/Rhythm: 77 SR    BP: sitting 155/97    SaO2:   MODE:  Ambulation: 620 ft   POST:  Rate/Rhythm: 86 SR    BP: sitting 175/95     SaO2:   Tolerated well, no c/o, feels good. BP elevated. He sts he has never had high BP and that he was just able to give blood last week without being told his BP was elevated. BP has been elevated here. Ed completed with good comprehension. Very thankful. Understands importance of antiplatelet (Brilinta then Plavix). Will refer to [place]. Gave him videos to watch today while waiting.  [place], ACSM 09/23/2016 10:50 AM

## 2016-09-26 ENCOUNTER — Encounter: Payer: Self-pay | Admitting: *Deleted

## 2016-09-26 NOTE — Telephone Encounter (Signed)
NO PHONE NUMBER LISTED  IN SNAPSHOT   OR CHART ITSELF  TO REACH PT .Randy Moore

## 2016-09-29 DIAGNOSIS — E785 Hyperlipidemia, unspecified: Secondary | ICD-10-CM

## 2016-09-29 HISTORY — DX: Hyperlipidemia, unspecified: E78.5

## 2016-09-29 NOTE — Progress Notes (Signed)
Cardiology Office Note:    Date:  09/30/2016   ID:  Randy Moore, DOB Jul 06, 1966, MRN [id number]  PCP:  No primary care provider on file.  Cardiologist:  Dr. Sherren Mocha   Electrophysiologist:  n/a  Referring MD: No ref. provider found   Chief Complaint  Patient presents with  . Hospitalization Follow-up    s/p MI >> PCI    History of Present Illness:    Randy Moore is a 51 y.o. male from [place].  He was admitted 1/31-2/2 with an acute inferior and RV myocardial infarction complicated by bradycardia and hypotension that was treated successfully with a DES to the RCA.  He had severe residual disease which includes subtotal occlusion of the LAD, severe stenosis of the D1 and severe stenosis of the RI.  EF was 45-50.  He underwent staged PCI with DES to the mid LAD and DES to the RI.  POBA of the D2 was unsuccessful.  EF was noted to be 35-40 by echocardiogram.    He returns for post hospital follow up.  He is here alone.  Since DC, he has been doing well and he denies chest pain, shortness of breath, syncope, orthopnea, PND or significant pedal edema.  He has been adherent with his medications.    Prior CV studies:   The following studies were reviewed today:  Echo 09/23/16 EF 35-40, inf-septal HK, apical septal and apical AK, ant and inf AK, ant-septal AK, Gr 1 diastolic dysfunction   PCI 09/22/16 3 x 38 mm Promus DES to mLAD Attempted POBA to oD2 (80 >> 80) 3 x 28 mm Promus DES to RI  LHC 09/21/16 LAD prox 99, ostial D1 80 RI prox 70 LCx ost 30, mid 70 RCA 100 EF 45-50 PCI: 2.5 x 20 mm Promus DES to pRCA 1. Acute inferior wall and RV myocardial infarction complicated by bradycardia and hypotension, treated successfully with primary PCI using a drug-eluting stent 2. Severe multivessel coronary artery disease with subtotal occlusion of the LAD, severe stenosis of the first diagonal, and severe stenosis of the ramus intermedius branch 3. Moderate segmental LV systolic dysfunction  with severe elevation of LVEDP Recommendations: The patient will be loaded with brilinta. Will continue Aggrastat for 6 hours. He may require diuresis considering his elevated LVEDP. Will need to decide on further revascularization. Burtis Junes an attempted staged PCI of the LAD with aggressive medical therapy. If LAD PCI is unsuccessful would consider CABG.  Past Medical History:  Diagnosis Date  . Chronic combined systolic and diastolic heart failure ([place]) 09/21/2016  . Coronary artery disease involving native coronary artery of native heart without angina pectoris 09/21/2016   a - Inf and RV infarct (STEMI) 1/18: LHC - pLAD, oD1 80, pRI 70, oLCx 30, mLCx 70, RCA 100, EF 45-50 >> PCI:  2.5 x 20 mm Promus DES to pRCA // Staged PCI 09/22/16: 3 x 38 mm Promus DES to mLAD; Attempted POBA to oD2 (80 >> 80); 3 x 28 mm Promus DES to RI  . History of ST elevation myocardial infarction (STEMI) 09/21/2016  . Hyperlipidemia 09/29/2016  . Ischemic cardiomyopathy 09/23/2016   a - s/p Inf and RV infarct (STEMI) 1/18 // b - Echo 09/23/16: EF 35-40, inf-septal HK, apical septal and apical AK, ant and inf AK, ant-septal AK, Gr 1 diastolic dysfunction      Past Surgical History:  Procedure Laterality Date  . CARDIAC CATHETERIZATION N/A 09/21/2016   Procedure: Left Heart Cath and Coronary Angiography;  Surgeon:  Sherren Mocha, MD;  Location: [facility];  Service: Cardiovascular;  Laterality: N/A;  . CARDIAC CATHETERIZATION N/A 09/21/2016   Procedure: Coronary Stent Intervention;  Surgeon: Sherren Mocha, MD;  Location: [place] [facility];  Service: Cardiovascular;  Laterality: N/A;  Prox RCA  . CARDIAC CATHETERIZATION N/A 09/22/2016   Procedure: Coronary CTO Intervention;  Surgeon: Burnell Blanks, MD;  Location: [facility];  Service: Cardiovascular;  Laterality: N/A;    Current Medications: Current Meds  Medication Sig  . acetaminophen (TYLENOL) 325 MG tablet Take 2 tablets (650 mg total) by mouth  every 4 (four) hours as needed for headache or mild pain.  Marland Kitchen aspirin EC 81 MG EC tablet Take 1 tablet (81 mg total) by mouth daily.  Marland Kitchen atorvastatin (LIPITOR) 80 MG tablet Take 1 tablet (80 mg total) by mouth daily at 6 PM.  . carvedilol (COREG) 3.125 MG tablet Take 1 tablet (3.125 mg total) by mouth 2 (two) times daily with a meal.  . losartan (COZAAR) 25 MG tablet Take 1 tablet (25 mg total) by mouth daily.  . nitroGLYCERIN (NITROSTAT) 0.4 MG SL tablet Place 1 tablet (0.4 mg total) under the tongue every 5 (five) minutes x 3 doses as needed for chest pain.  . ranitidine (ZANTAC) 150 MG tablet Take 150 mg by mouth daily as needed for heartburn.  . ticagrelor (BRILINTA) 90 MG TABS tablet Take 1 tablet (90 mg total) by mouth 2 (two) times daily.  . [DISCONTINUED] ticagrelor (BRILINTA) 90 MG TABS tablet Take 1 tablet (90 mg total) by mouth 2 (two) times daily.  . [DISCONTINUED] ticagrelor (BRILINTA) 90 MG TABS tablet Take 1 tablet (90 mg total) by mouth 2 (two) times daily.     Allergies:   Patient has no known allergies.   Social History   Social History  . Marital status: Married    Spouse name: N/A  . Number of children: N/A  . Years of education: N/A   Social History Main Topics  . Smoking status: Never Smoker  . Smokeless tobacco: Never Used  . Alcohol use No  . Drug use: No  . Sexual activity: Not Asked   Other Topics Concern  . None   Social History Narrative  . None     Family History  Problem Relation Age of Onset  . Other Mother     chest pains  . Heart attack Father      ROS:   Please see the history of present illness.    ROS All other systems reviewed and are negative.   EKGs/Labs/Other Test Reviewed:    EKG:  EKG is  ordered today.  The ekg ordered today demonstrates NSR, HR 69, Normal axis, Anterior Q waves, inf-lat TWI, QTc 383 ms, no changes from last tracing   Recent Labs: 09/21/2016: TSH 2.579 09/23/2016: BUN 7; Creatinine, Ser 0.94; Hemoglobin 13.4;  Platelets 136; Potassium 3.7; Sodium 141   Recent Lipid Panel    Component Value Date/Time   CHOL 136 09/21/2016 0346   TRIG 32 09/21/2016 0346   HDL 38 (L) 09/21/2016 0346   CHOLHDL 3.6 09/21/2016 0346   VLDL 6 09/21/2016 0346   LDLCALC 92 09/21/2016 0346     Physical Exam:    VS:  BP 122/90   Pulse 69   Ht 5\' 8"  (1.727 m)   Wt 183 lb 1.9 oz (83.1 kg)   BMI 27.84 kg/m     Wt Readings from Last 3 Encounters:  09/30/16 183 lb 1.9 oz (83.1 kg)  09/23/16 189 lb 2.5 oz (85.8 kg)     Physical Exam  Constitutional: He is oriented to person, place, and time. He appears well-developed and well-nourished. No distress.  HENT:  Head: Normocephalic and atraumatic.  Eyes: No scleral icterus.  Neck: Normal range of motion. No JVD present.  Cardiovascular: Normal rate, regular rhythm, S1 normal and S2 normal.   No murmur heard. Pulmonary/Chest: Effort normal and breath sounds normal. He has no wheezes. He has no rhonchi. He has no rales.  Abdominal: Soft. There is no tenderness.  Musculoskeletal: He exhibits no edema.  R wrist without hematoma  Neurological: He is alert and oriented to person, place, and time.  Skin: Skin is warm and dry.  Psychiatric: He has a normal mood and affect.    ASSESSMENT:    1. STEMI involving oth coronary artery of inferior wall ([place])   2. Coronary artery disease involving native coronary artery of native heart without angina pectoris   3. Chronic combined systolic and diastolic heart failure ([place])   4. Ischemic cardiomyopathy   5. Pure hypercholesterolemia    PLAN:    In order of problems listed above:  1. Inferior STEMI - s/p inferior STEMI tx with DES to RCA and staged PCI with DES to LAD and DES to RI.  POBA to D2 was not successful.  He is doing well without angina.  He is interested in cardiac rehabilitation.  We discussed the importance of dual antiplatelet therapy.  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 He would like to  return this weekend.   -  Continue ASA, Brilinta, statin, beta-blocker, ACE-inhibitor.  -  Cardiac rehabilitation is pending  -  I have given him a note to return to work this weekend.  2. CAD - He has residual CAD in the LCx and Dx, but is asymptomatic.  Continue ASA, Brilinta, statin, beta-blocker.    3. Chronic combined systolic and diastolic CHF - NYHA 1-2.  2/2 to ischemic CM. Continue beta-blocker, angiotensin receptor blocker.  Check BMET today.  If potassium and creatinine normal, start Spironolactone 12.5 mg QD.  4. Ischemic CM - Repeat Echo in 3 mos to ensure EF > 35.  If < 35, refer to EP for ICD.  5. HL - Continue high dose statin.  Check Lipids and LFTs in 6 weeks.    Medication Adjustments/Labs and Tests Ordered: Current medicines are reviewed at length with the patient today.  Concerns regarding medicines are outlined above.  Medication changes, Labs and Tests ordered today are outlined in the Patient Instructions noted below. Patient Instructions  Medication Instructions:  Your physician recommends that you continue on your current medications as directed. Please refer to the Current Medication list given to you today.  Labwork: 1. TODAY BMET 2. 11/11/16 FASTING LIPID AND LIVER PANEL  Testing/Procedures: Your physician has requested that you have an echocardiogram. Echocardiography is a painless test that uses sound waves to create images of your heart. It provides your doctor with information about the size and shape of your heart and how well your heart's chambers and valves are working. This procedure takes approximately one hour. There are no restrictions for this procedure. THIS IS TO BE DONE ON OR AFTER 12/20/16;  DX ISCHEMIC CARDIOMYOPATHY  Follow-Up: 1. SCOTT WEAVER, PAC 6 WEEKS 2. DR. Burt Knack 3 MONTHS   Any Other Special Instructions Will Be Listed Below (If Applicable).  If you need a  refill on your cardiac medications before your next appointment, please call  your pharmacy.   Return in about 6 weeks (around 11/11/2016) for Routine Follow Up, w/ [name], PA-C.   Signed, [name], PA-C  09/30/2016 12:54 PM    [facility] [address], [place], [place]  [address] Phone: [phone]; Fax: [phone]

## 2016-09-30 ENCOUNTER — Encounter: Payer: Self-pay | Admitting: Physician Assistant

## 2016-09-30 ENCOUNTER — Ambulatory Visit (INDEPENDENT_AMBULATORY_CARE_PROVIDER_SITE_OTHER): Payer: Managed Care, Other (non HMO) | Admitting: Physician Assistant

## 2016-09-30 VITALS — BP 122/90 | HR 69 | Ht 68.0 in | Wt 183.1 lb

## 2016-09-30 DIAGNOSIS — I5042 Chronic combined systolic (congestive) and diastolic (congestive) heart failure: Secondary | ICD-10-CM | POA: Diagnosis not present

## 2016-09-30 DIAGNOSIS — I255 Ischemic cardiomyopathy: Secondary | ICD-10-CM | POA: Diagnosis not present

## 2016-09-30 DIAGNOSIS — I2510 Atherosclerotic heart disease of native coronary artery without angina pectoris: Secondary | ICD-10-CM

## 2016-09-30 DIAGNOSIS — I2119 ST elevation (STEMI) myocardial infarction involving other coronary artery of inferior wall: Secondary | ICD-10-CM

## 2016-09-30 DIAGNOSIS — E7800 Pure hypercholesterolemia, unspecified: Secondary | ICD-10-CM

## 2016-09-30 LAB — BASIC METABOLIC PANEL
BUN/Creatinine Ratio: 8 — ABNORMAL LOW (ref 9–20)
BUN: 7 mg/dL (ref 6–24)
CO2: 26 mmol/L (ref 18–29)
CREATININE: 0.91 mg/dL (ref 0.76–1.27)
Calcium: 9.4 mg/dL (ref 8.7–10.2)
Chloride: 101 mmol/L (ref 96–106)
GFR, EST AFRICAN AMERICAN: 112 mL/min/{1.73_m2} (ref >59)
GFR, EST NON AFRICAN AMERICAN: 97 mL/min/{1.73_m2} (ref >59)
Glucose: 106 mg/dL — ABNORMAL HIGH (ref 65–99)
Potassium: 4 mmol/L (ref 3.5–5.2)
SODIUM: 142 mmol/L (ref 134–144)

## 2016-09-30 MED ORDER — TICAGRELOR 90 MG PO TABS: 90.0000 mg | ORAL_TABLET | Freq: Two times a day (BID) | ORAL | 3 refills | Status: DC

## 2016-09-30 NOTE — Patient Instructions (Addendum)
Medication Instructions:  Your physician recommends that you continue on your current medications as directed. Please refer to the Current Medication list given to you today.  Labwork: 1. TODAY BMET 2. 11/11/16 FASTING LIPID AND LIVER PANEL  Testing/Procedures: Your physician has requested that you have an echocardiogram. Echocardiography is a painless test that uses sound waves to create images of your heart. It provides your doctor with information about the size and shape of your heart and how well your heart's chambers and valves are working. This procedure takes approximately one hour. There are no restrictions for this procedure. THIS IS TO BE DONE ON OR AFTER 12/20/16;  DX ISCHEMIC CARDIOMYOPATHY  Follow-Up: 1. SCOTT WEAVER, PAC 6 WEEKS 2. DR. Burt Knack 3 MONTHS   Any Other Special Instructions Will Be Listed Below (If Applicable).  If you need a refill on your cardiac medications before your next appointment, please call your pharmacy.

## 2016-10-03 ENCOUNTER — Telehealth: Payer: Self-pay | Admitting: *Deleted

## 2016-10-03 DIAGNOSIS — I5042 Chronic combined systolic (congestive) and diastolic (congestive) heart failure: Secondary | ICD-10-CM

## 2016-10-03 DIAGNOSIS — I255 Ischemic cardiomyopathy: Secondary | ICD-10-CM

## 2016-10-03 MED ORDER — SPIRONOLACTONE 25 MG PO TABS: 12.5000 mg | ORAL_TABLET | Freq: Every day | ORAL | 4 refills | Status: DC

## 2016-10-03 NOTE — Telephone Encounter (Signed)
I spoke with pt and reviewed lab results and recommendations from [place], [place] with him. Pt is asking if he should continue SL NTG.  Upon speaking with pt it was determined he has been taking 3 SL NTG everyday and is now out.  He has not been having chest pain but thought he was to  take daily.  I explained the proper way to take NTG to pt and he verbalizes understanding. I told him he should refill NTG to have on hand as needed.

## 2016-10-03 NOTE — Telephone Encounter (Signed)
Thanks, Electrical engineer. He did not share that with me at his follow up appointment.  [name], PA-C   10/03/2016 11:48 AM

## 2016-10-10 ENCOUNTER — Telehealth: Payer: Self-pay | Admitting: Cardiovascular Disease

## 2016-10-10 ENCOUNTER — Other Ambulatory Visit: Payer: Managed Care, Other (non HMO) | Admitting: *Deleted

## 2016-10-10 DIAGNOSIS — I255 Ischemic cardiomyopathy: Secondary | ICD-10-CM

## 2016-10-10 DIAGNOSIS — I5042 Chronic combined systolic (congestive) and diastolic (congestive) heart failure: Secondary | ICD-10-CM

## 2016-10-10 NOTE — Telephone Encounter (Signed)
Walk in Pt Form-Questions about RTW. Please call placed in Doc box.

## 2016-10-11 ENCOUNTER — Telehealth: Payer: Self-pay | Admitting: Cardiovascular Disease

## 2016-10-11 LAB — BASIC METABOLIC PANEL
BUN/Creatinine Ratio: 11 (ref 9–20)
BUN: 11 mg/dL (ref 6–24)
CALCIUM: 9 mg/dL (ref 8.7–10.2)
CO2: 26 mmol/L (ref 18–29)
Chloride: 102 mmol/L (ref 96–106)
Creatinine, Ser: 0.99 mg/dL (ref 0.76–1.27)
GFR, EST AFRICAN AMERICAN: 101 mL/min/{1.73_m2} (ref >59)
GFR, EST NON AFRICAN AMERICAN: 87 mL/min/{1.73_m2} (ref >59)
Glucose: 100 mg/dL — ABNORMAL HIGH (ref 65–99)
POTASSIUM: 3.8 mmol/L (ref 3.5–5.2)
Sodium: 142 mmol/L (ref 134–144)

## 2016-10-11 NOTE — Telephone Encounter (Signed)
New Message    Kim from [facility] needs the following medical record information on Dr. Antionette Char pt [name]..  1. Dx and treatments for pt 2. Was pt ever taken out of work 3. If there were procedures - what were the CPT codes? When was it done? 4. Any referrals to other doctors?  Maudie Mercury is faxing an "attending provider verification form" as well.

## 2016-10-12 ENCOUNTER — Telehealth: Payer: Self-pay | Admitting: *Deleted

## 2016-10-12 ENCOUNTER — Telehealth: Payer: Self-pay | Admitting: Cardiovascular Disease

## 2016-10-12 NOTE — Telephone Encounter (Signed)
Pt notified of lab results by phone with verbal understanding.  

## 2016-10-12 NOTE — Telephone Encounter (Signed)
He is s/p STEMI with multivessel stenting and ischemic cardiomyopathy. I think it is best for him to observe the restrictions set forth in the letter I provided him. 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 [name], PA-C   10/12/2016 8:12 PM

## 2016-10-12 NOTE — Telephone Encounter (Signed)
New Message     Pt would like you to release him back to work earlier, he forgot to ask you yesterday he does not want to wait the 6 weeks

## 2016-10-12 NOTE — Telephone Encounter (Signed)
I spoke with the pt and made him aware that [name] PA had already cleared him to return to work on 10/02/16 but with restrictions (letter in [facility]). The pt said he did provide his employer with the letter from 09/30/16 and his employer told the pt that they were not comfortable allowing him to work with restrictions and felt like he should be out of work the next 6 weeks.  The pt would like to know if he can be released to return to work with no restrictions.  I will forward this information to [name] PA-C for review and further recommendations.

## 2016-10-13 NOTE — Telephone Encounter (Signed)
Spoke with Marcelline Mates at [facility] made her aware I cannot release the Information they are requesting on patient. But we do have the [place] paperwork they have faxed over for completion. Our copy service is working on getting it processed.

## 2016-10-13 NOTE — Telephone Encounter (Signed)
Patient advised to observe the restrictions noted in the return to work letter. The patient states that his employer told him that they do not offer light duty for their employees and that with the restrictions in the letter he will have to wait to return to work when the restrictions are removed (after March 25). It was explained to the patient that given all that he has just been through (s/p STEMI with multivessel stenting and ischemic cardiomyopathy) that he should abide by these restrictions. The patient verbalized understanding and appreciated the call.

## 2016-10-14 NOTE — Telephone Encounter (Signed)
Please call patient and ask the following questions. I can see if there is a way to let him return sooner depending upon his job activities.   What is the most he would have to lift? What is the most hours he would have to work in 1 day? 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 What is the most exertion he would have to do at his job? [name], PA-C   10/14/2016 1:47 PM

## 2016-10-17 ENCOUNTER — Other Ambulatory Visit: Payer: Managed Care, Other (non HMO) | Admitting: *Deleted

## 2016-10-17 ENCOUNTER — Telehealth: Payer: Self-pay | Admitting: Chiropractic Medicine

## 2016-10-17 DIAGNOSIS — I5042 Chronic combined systolic (congestive) and diastolic (congestive) heart failure: Secondary | ICD-10-CM

## 2016-10-17 DIAGNOSIS — I2510 Atherosclerotic heart disease of native coronary artery without angina pectoris: Secondary | ICD-10-CM

## 2016-10-17 DIAGNOSIS — R570 Cardiogenic shock: Secondary | ICD-10-CM

## 2016-10-17 DIAGNOSIS — I219 Acute myocardial infarction, unspecified: Secondary | ICD-10-CM

## 2016-10-17 DIAGNOSIS — E7800 Pure hypercholesterolemia, unspecified: Secondary | ICD-10-CM

## 2016-10-17 LAB — BASIC METABOLIC PANEL
BUN / CREAT RATIO: 8 — AB (ref 9–20)
BUN: 8 mg/dL (ref 6–24)
CHLORIDE: 100 mmol/L (ref 96–106)
CO2: 25 mmol/L (ref 18–29)
CREATININE: 1.02 mg/dL (ref 0.76–1.27)
Calcium: 9.1 mg/dL (ref 8.7–10.2)
GFR calc Af Amer: 97 mL/min/{1.73_m2} (ref >59)
GFR calc non Af Amer: 84 mL/min/{1.73_m2} (ref >59)
GLUCOSE: 110 mg/dL — AB (ref 65–99)
POTASSIUM: 3.8 mmol/L (ref 3.5–5.2)
SODIUM: 141 mmol/L (ref 134–144)

## 2016-10-17 NOTE — Telephone Encounter (Signed)
New Message    Returning your call about he returning back to work earlier  What is the most he would have to lift? 5-10 lbs What is the most hours he would have to work in 1 day? 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

## 2016-10-17 NOTE — Telephone Encounter (Signed)
I attempted to reach pt to go over a series of question per Brynda Rim. PA is needing to know, VM is not set up. Pt is asking to go back to work sooner with no restrictions. See previous notes.

## 2016-10-17 NOTE — Telephone Encounter (Signed)
Ptcb and has answered the questioner from [place]. I will route phone note to [place] for further recommendations.

## 2016-10-18 ENCOUNTER — Telehealth: Payer: Self-pay | Admitting: *Deleted

## 2016-10-18 NOTE — Telephone Encounter (Signed)
Pt has been notified of lab results ok and to continue on current medication regimen at this time. I also s/w pt in regards to work note and if any restrictions. I advised pt that Brynda Rim. PA will do up a new letter tomorrow and I will call when that letter is ready for pick up. Pt thanked me for all of our help.

## 2016-10-18 NOTE — Telephone Encounter (Signed)
Follow up    Returning your call for lab results

## 2016-10-18 NOTE — Telephone Encounter (Signed)
VM not set up on pt's phone. Tried to reach pt to go over lab results as well as to advise pt that Liliane Shi, PA will do a new letter tomorrow 10/19/16 for employer.

## 2016-10-19 ENCOUNTER — Encounter: Payer: Self-pay | Admitting: Physician Assistant

## 2016-10-19 ENCOUNTER — Telehealth: Payer: Self-pay | Admitting: *Deleted

## 2016-10-19 NOTE — Telephone Encounter (Signed)
As his work duties are not strenuous, I have written him a letter to release him back to work. [name], PA-C   10/19/2016 11:04 AM

## 2016-10-19 NOTE — Telephone Encounter (Signed)
I tried to call pt new letter at the front desk for pick up. No answer.

## 2016-10-19 NOTE — Telephone Encounter (Signed)
VM box not set up. I was a calling the pt to let hm know that a new work note has been placed at the front desk for pick up.

## 2016-10-20 ENCOUNTER — Other Ambulatory Visit: Payer: Self-pay | Admitting: Cardiology

## 2016-11-11 ENCOUNTER — Other Ambulatory Visit: Payer: 59 | Admitting: *Deleted

## 2016-11-11 DIAGNOSIS — E7800 Pure hypercholesterolemia, unspecified: Secondary | ICD-10-CM

## 2016-11-11 LAB — HEPATIC FUNCTION PANEL
ALBUMIN: 4.2 g/dL (ref 3.5–5.5)
ALK PHOS: 75 IU/L (ref 39–117)
ALT: 36 IU/L (ref 0–44)
AST: 36 IU/L (ref 0–40)
BILIRUBIN TOTAL: 0.7 mg/dL (ref 0.0–1.2)
Bilirubin, Direct: 0.21 mg/dL (ref 0.00–0.40)
Total Protein: 6.6 g/dL (ref 6.0–8.5)

## 2016-11-11 LAB — LIPID PANEL
CHOLESTEROL TOTAL: 109 mg/dL (ref 100–199)
Chol/HDL Ratio: 2.9 ratio units (ref 0.0–5.0)
HDL: 38 mg/dL — ABNORMAL LOW (ref >39)
LDL Calculated: 55 mg/dL (ref 0–99)
Triglycerides: 82 mg/dL (ref 0–149)
VLDL Cholesterol Cal: 16 mg/dL (ref 5–40)

## 2016-11-14 ENCOUNTER — Telehealth: Payer: Self-pay | Admitting: *Deleted

## 2016-11-14 NOTE — Telephone Encounter (Signed)
Pt notified of lab results by phone with verbal understanding. I will send a copy of results to pt by mail. I verified pt address. Pt thanked me for the call today.

## 2016-11-14 NOTE — Telephone Encounter (Signed)
-----   Message from Liliane Shi, [place] sent at 11/13/2016 10:14 PM EDT ----- Please call the patient Lipids at goal. LFTs normal. Continue with current treatment plan. [name], PA-C   11/13/2016 10:14 PM

## 2016-11-18 ENCOUNTER — Encounter: Payer: Self-pay | Admitting: Physician Assistant

## 2016-11-18 ENCOUNTER — Encounter (INDEPENDENT_AMBULATORY_CARE_PROVIDER_SITE_OTHER): Payer: Self-pay

## 2016-11-18 ENCOUNTER — Ambulatory Visit (INDEPENDENT_AMBULATORY_CARE_PROVIDER_SITE_OTHER): Payer: 59 | Admitting: Physician Assistant

## 2016-11-18 VITALS — BP 130/80 | HR 64 | Ht 68.0 in | Wt 174.8 lb

## 2016-11-18 DIAGNOSIS — E7800 Pure hypercholesterolemia, unspecified: Secondary | ICD-10-CM

## 2016-11-18 DIAGNOSIS — I5042 Chronic combined systolic (congestive) and diastolic (congestive) heart failure: Secondary | ICD-10-CM

## 2016-11-18 DIAGNOSIS — I2510 Atherosclerotic heart disease of native coronary artery without angina pectoris: Secondary | ICD-10-CM

## 2016-11-18 NOTE — Patient Instructions (Addendum)
Medication Instructions:  Your physician recommends that you continue on your current medications as directed. Please refer to the Current Medication list given to you today.  YOU HAVE BEEN GIVEN THE BRILINTA CARD FOR THE 90 DAY SUPPLY CO-PAY  Labwork: NONE ORDERED   Testing/Procedures: YOU ARE SCHEDULED FOR AN ECHO TO BE DONE IN MAY 2018  Follow-Up: KEEP YOUR APPOINTMENT WITH DR. Mechele Collin FOR MAY 2018 AFTER YOUR ECHO HAS BEEN DONE  Any Other Special Instructions Will Be Listed Below (If Applicable). If you need a refill on your cardiac medications before your next appointment, please call your pharmacy.

## 2016-11-18 NOTE — Progress Notes (Signed)
Cardiology Office Note:    Date:  11/18/2016   ID:  Randy Moore, DOB [birth year], MRN [id number]  PCP:  No PCP Per Patient  Cardiologist:  Dr. Sherren Mocha   Electrophysiologist:  n/a  Referring MD: No ref. provider found   Chief Complaint  Patient presents with  . Congestive Heart Failure    follow up    History of Present Illness:    Randy Moore is a 51 y.o. [place] male with a hx of CAD s/p inferior MI (RV infarct) in 08/2016, ischemic CM, systolic/diastolic CHF.  His myocardial infarction was complicated by hypotension and bradycardia. He underwent successful PCI with DES to the RCA. Residual disease included subtotal occlusion of the LAD, severe diagonal and severe ramus intermediate stenosis. He subsequently underwent staged PCI with DES to the mid LAD and DES to the ramus intermediate.  Attempted balloon angioplasty to the second diagonal was unsuccessful.    Last seen 09/30/16.  He returns for follow up.  He has been doing well and is back at work.  Since last seen, he denies chest pain, shortness of breath, syncope, orthopnea, PND or significant pedal edema.    Prior CV studies:   The following studies were reviewed today:  Echo 09/23/16 EF 35-40, inf-septal HK, apical septal and apical AK, ant and inf AK, ant-septal AK, Gr 1 diastolic dysfunction    PCI 09/22/16 3 x 38 mm Promus DES to mLAD Attempted POBA to oD2 (80 >> 80) 3 x 28 mm Promus DES to RI   LHC 09/21/16 LAD prox 99, ostial D1 80 RI prox 70 LCx ost 30, mid 70 RCA 100 EF 45-50 PCI: 2.5 x 20 mm Promus DES to pRCA   Past Medical History:  Diagnosis Date  . Chronic combined systolic and diastolic heart failure ([place]) 09/21/2016  . Coronary artery disease involving native coronary artery of native heart without angina pectoris 09/21/2016   a - Inf and RV infarct (STEMI) 1/18: LHC - pLAD, oD1 80, pRI 70, oLCx 30, mLCx 70, RCA 100, EF 45-50 >> PCI:  2.5 x 20 mm Promus DES to pRCA // Staged PCI 09/22/16: 3 x 38 mm  Promus DES to mLAD; Attempted POBA to oD2 (80 >> 80); 3 x 28 mm Promus DES to RI  . History of ST elevation myocardial infarction (STEMI) 09/21/2016  . Hyperlipidemia 09/29/2016  . Ischemic cardiomyopathy 09/23/2016   a - s/p Inf and RV infarct (STEMI) 1/18 // b - Echo 09/23/16: EF 35-40, inf-septal HK, apical septal and apical AK, ant and inf AK, ant-septal AK, Gr 1 diastolic dysfunction      Past Surgical History:  Procedure Laterality Date  . CARDIAC CATHETERIZATION N/A 09/21/2016   Procedure: Left Heart Cath and Coronary Angiography;  Surgeon: Sherren Mocha, MD;  Location: [facility];  Service: Cardiovascular;  Laterality: N/A;  . CARDIAC CATHETERIZATION N/A 09/21/2016   Procedure: Coronary Stent Intervention;  Surgeon: Sherren Mocha, MD;  Location: [facility];  Service: Cardiovascular;  Laterality: N/A;  Prox RCA  . CARDIAC CATHETERIZATION N/A 09/22/2016   Procedure: Coronary CTO Intervention;  Surgeon: Burnell Blanks, MD;  Location: [facility];  Service: Cardiovascular;  Laterality: N/A;    Current Medications: Current Meds  Medication Sig  . acetaminophen (TYLENOL) 325 MG tablet Take 2 tablets (650 mg total) by mouth every 4 (four) hours as needed for headache or mild pain.  Marland Kitchen aspirin EC 81 MG EC tablet Take 1 tablet (81  mg total) by mouth daily.  Marland Kitchen atorvastatin (LIPITOR) 80 MG tablet Take 1 tablet (80 mg total) by mouth daily at 6 PM.  . carvedilol (COREG) 3.125 MG tablet Take 1 tablet (3.125 mg total) by mouth 2 (two) times daily with a meal.  . losartan (COZAAR) 25 MG tablet Take 1 tablet (25 mg total) by mouth daily.  . nitroGLYCERIN (NITROSTAT) 0.4 MG SL tablet Place 1 tablet (0.4 mg total) under the tongue every 5 (five) minutes x 3 doses as needed for chest pain.  . ranitidine (ZANTAC) 150 MG tablet Take 150 mg by mouth daily as needed for heartburn.  . spironolactone (ALDACTONE) 25 MG tablet Take 0.5 tablets (12.5 mg total) by mouth daily.  .  ticagrelor (BRILINTA) 90 MG TABS tablet Take 1 tablet (90 mg total) by mouth 2 (two) times daily.     Allergies:   Patient has no known allergies.   Social History   Social History  . Marital status: Married    Spouse name: N/A  . Number of children: N/A  . Years of education: N/A   Social History Main Topics  . Smoking status: Never Smoker  . Smokeless tobacco: Never Used  . Alcohol use No  . Drug use: No  . Sexual activity: Not Asked   Other Topics Concern  . None   Social History Narrative  . None     Family History  Problem Relation Age of Onset  . Other Mother     chest pains  . Heart attack Father      ROS:   Please see the history of present illness.    Review of Systems  Musculoskeletal: Positive for joint pain (L hand pain).   All other systems reviewed and are negative.   EKGs/Labs/Other Test Reviewed:    EKG:  EKG is not ordered today.  The ekg ordered today demonstrates n/a  Recent Labs: 09/21/2016: TSH 2.579 09/23/2016: Hemoglobin 13.4; Platelets 136 10/17/2016: BUN 8; Creatinine, Ser 1.02; Potassium 3.8; Sodium 141 11/11/2016: ALT 36   Recent Lipid Panel    Component Value Date/Time   CHOL 109 11/11/2016 0817   TRIG 82 11/11/2016 0817   HDL 38 (L) 11/11/2016 0817   CHOLHDL 2.9 11/11/2016 0817   CHOLHDL 3.6 09/21/2016 0346   VLDL 6 09/21/2016 0346   LDLCALC 55 11/11/2016 0817    Physical Exam:    VS:  BP 130/80   Pulse 64   Ht 5\' 8"  (1.727 m)   Wt 174 lb 12.8 oz (79.3 kg)   BMI 26.58 kg/m     Wt Readings from Last 3 Encounters:  11/18/16 174 lb 12.8 oz (79.3 kg)  09/30/16 183 lb 1.9 oz (83.1 kg)  09/23/16 189 lb 2.5 oz (85.8 kg)     Physical Exam  Constitutional: He is oriented to person, place, and time. He appears well-developed and well-nourished. No distress.  HENT:  Head: Normocephalic and atraumatic.  Eyes: No scleral icterus.  Neck: Normal range of motion. No JVD present.  Cardiovascular: Normal rate, regular rhythm,  S1 normal and S2 normal.   No murmur heard. Pulmonary/Chest: Effort normal and breath sounds normal. He has no wheezes. He has no rhonchi. He has no rales.  Abdominal: Soft. There is no tenderness.  Musculoskeletal: He exhibits no edema.  Neurological: He is alert and oriented to person, place, and time.  Skin: Skin is warm and dry.  Psychiatric: He has a normal mood and affect.    ASSESSMENT:  1. Chronic combined systolic and diastolic heart failure ([place])   2. Coronary artery disease involving native coronary artery of native heart without angina pectoris   3. Pure hypercholesterolemia    PLAN:    In order of problems listed above:  1. Chronic combined systolic and diastolic heart failure ([place]) -  Secondary to ischemic cardiomyopathy. He is NYHA 1 (therefore, not a candidate for Entresto). He is on good medical regimen with beta blocker, ARB and spironolactone. Follow-up echocardiogram is planned in May. If his EF is less than 35%, he will need referral to EP for ICD.  2. Coronary artery disease involving native coronary artery of native heart without angina pectoris -  s/p inferior STEMI tx with DES to RCA and staged PCI with DES to LAD and DES to RI.  POBA to D2 was not successful.  He is doing well without angina. Co-pay for Brilinta is about $75. However, he is getting 30 days at a time. I will give him a prescription for a 90 day supply to hopefully decrease his co-pay. Continue aspirin, Brilinta, statin, beta blocker, ARB.  3. Pure hypercholesterolemia -  LDL in 3/18 was 55. Continue high-dose statin.   Dispo:  Return in about 5 weeks (around 12/23/2016) for Scheduled Follow Up, w/ Dr. Burt Knack.  He is trying to find a PCP.Marland Kitchen   Medication Adjustments/Labs and Tests Ordered: Current medicines are reviewed at length with the patient today.  Concerns regarding medicines are outlined above.  Medication changes, Labs and Tests ordered today are outlined in the Patient Instructions  noted below. Patient Instructions  Medication Instructions:  Your physician recommends that you continue on your current medications as directed. Please refer to the Current Medication list given to you today.  YOU HAVE BEEN GIVEN THE BRILINTA CARD FOR THE 90 DAY SUPPLY CO-PAY  Labwork: NONE ORDERED   Testing/Procedures: YOU ARE SCHEDULED FOR AN ECHO TO BE DONE IN MAY 2018  Follow-Up: KEEP YOUR APPOINTMENT WITH DR. Mechele Collin FOR MAY 2018 AFTER YOUR ECHO HAS BEEN DONE  Any Other Special Instructions Will Be Listed Below (If Applicable). If you need a refill on your cardiac medications before your next appointment, please call your pharmacy.  Signed, [name], PA-C  11/18/2016 1:15 PM    [facility] [place], [place], [place]  [address] Phone: [phone]; Fax: [phone]

## 2016-12-06 ENCOUNTER — Encounter: Payer: Self-pay | Admitting: Cardiovascular Disease

## 2016-12-23 ENCOUNTER — Encounter: Payer: Self-pay | Admitting: Cardiovascular Disease

## 2016-12-23 ENCOUNTER — Other Ambulatory Visit: Payer: Self-pay

## 2016-12-23 ENCOUNTER — Ambulatory Visit (INDEPENDENT_AMBULATORY_CARE_PROVIDER_SITE_OTHER): Payer: 59 | Admitting: Cardiovascular Disease

## 2016-12-23 ENCOUNTER — Ambulatory Visit (HOSPITAL_COMMUNITY): Payer: 59 | Attending: Cardiology

## 2016-12-23 ENCOUNTER — Other Ambulatory Visit: Payer: Self-pay | Admitting: Physician Assistant

## 2016-12-23 VITALS — BP 132/72 | HR 70 | Ht 68.0 in | Wt 173.2 lb

## 2016-12-23 DIAGNOSIS — I2119 ST elevation (STEMI) myocardial infarction involving other coronary artery of inferior wall: Secondary | ICD-10-CM | POA: Diagnosis not present

## 2016-12-23 DIAGNOSIS — I5189 Other ill-defined heart diseases: Secondary | ICD-10-CM | POA: Insufficient documentation

## 2016-12-23 DIAGNOSIS — I255 Ischemic cardiomyopathy: Secondary | ICD-10-CM | POA: Diagnosis not present

## 2016-12-23 DIAGNOSIS — I5042 Chronic combined systolic (congestive) and diastolic (congestive) heart failure: Secondary | ICD-10-CM

## 2016-12-23 LAB — ECHOCARDIOGRAM LIMITED
CHL CUP DOP CALC LVOT VTI: 15.6 cm
CHL CUP STROKE VOLUME: 50 mL
E decel time: 239 msec
E/e' ratio: 10.84
FS: 25 % — AB (ref 28–44)
IVS/LV PW RATIO, ED: 0.99
LA diam end sys: 30 mm
LADIAMINDEX: 1.55 cm/m2
LASIZE: 30 mm
LV E/e' medial: 10.84
LV E/e'average: 10.84
LV TDI E'LATERAL: 6.92
LV dias vol index: 58 mL/m2
LV dias vol: 111 mL (ref 62–150)
LV e' LATERAL: 6.92 cm/s
LVOT area: 2.84 cm2
LVOT peak grad rest: 3 mmHg
LVOT peak vel: 86.5 cm/s
LVOTD: 19 mm
LVOTSV: 44 mL
LVSYSVOL: 61 mL (ref 21–61)
LVSYSVOLIN: 32 mL/m2
MV Dec: 239
MV Peak grad: 2 mmHg
MV pk A vel: 83.4 m/s
MVPKEVEL: 75 m/s
PW: 11.4 mm — AB (ref 0.6–1.1)
Simpson's disk: 45
TDI e' medial: 6.43

## 2016-12-23 MED ORDER — CARVEDILOL 6.25 MG PO TABS: 6.2500 mg | ORAL_TABLET | Freq: Two times a day (BID) | ORAL | 3 refills | Status: DC

## 2016-12-23 MED ORDER — CLOPIDOGREL BISULFATE 75 MG PO TABS: 75.0000 mg | ORAL_TABLET | Freq: Every day | ORAL | 3 refills | Status: DC

## 2016-12-23 NOTE — Progress Notes (Signed)
Cardiology Office Note Date:  12/23/2016   ID:  Randy Moore, DOB July 17, 1966, MRN [id number]  PCP:  No PCP Per Patient  Cardiologist:  Sherren Mocha, MD    Chief Complaint  Patient presents with  . Coronary Artery Disease   History of Present Illness: Randy Moore is a 51 y.o. male who presents for follow-up evaluation. The patient presented in January 2018 with an acute inferior wall infarct complicated by RV infarction and hypotension. He was treated with primary PCI with a drug-eluting stent in the right coronary artery. He underwent staged intervention of the LAD and ramus intermedius during his index hospitalization. There was an occluded second diagonal following his LAD intervention.  The patient is here alone today. He is doing very well. Today, he denies symptoms of palpitations, chest pain, shortness of breath, orthopnea, PND, lower extremity edema, dizziness, or syncope.  He is having trouble affording brilinta. This is costing him $75 per month. Otherwise he is having no issues with his medications. He's been physically active and wants to get back to participating in sporting activities soon.  Past Medical History:  Diagnosis Date  . Chronic combined systolic and diastolic heart failure ([place]) 09/21/2016  . Coronary artery disease involving native coronary artery of native heart without angina pectoris 09/21/2016   a - Inf and RV infarct (STEMI) 1/18: LHC - pLAD, oD1 80, pRI 70, oLCx 30, mLCx 70, RCA 100, EF 45-50 >> PCI:  2.5 x 20 mm Promus DES to pRCA // Staged PCI 09/22/16: 3 x 38 mm Promus DES to mLAD; Attempted POBA to oD2 (80 >> 80); 3 x 28 mm Promus DES to RI  . History of ST elevation myocardial infarction (STEMI) 09/21/2016  . Hyperlipidemia 09/29/2016  . Ischemic cardiomyopathy 09/23/2016   a - s/p Inf and RV infarct (STEMI) 1/18 // b - Echo 09/23/16: EF 35-40, inf-septal HK, apical septal and apical AK, ant and inf AK, ant-septal AK, Gr 1 diastolic dysfunction      Past  Surgical History:  Procedure Laterality Date  . CARDIAC CATHETERIZATION N/A 09/21/2016   Procedure: Left Heart Cath and Coronary Angiography;  Surgeon: Sherren Mocha, MD;  Location: [facility];  Service: Cardiovascular;  Laterality: N/A;  . CARDIAC CATHETERIZATION N/A 09/21/2016   Procedure: Coronary Stent Intervention;  Surgeon: Sherren Mocha, MD;  Location: [facility];  Service: Cardiovascular;  Laterality: N/A;  Prox RCA  . CARDIAC CATHETERIZATION N/A 09/22/2016   Procedure: Coronary CTO Intervention;  Surgeon: Burnell Blanks, MD;  Location: [facility];  Service: Cardiovascular;  Laterality: N/A;    Current Outpatient Prescriptions  Medication Sig Dispense Refill  . acetaminophen (TYLENOL) 325 MG tablet Take 2 tablets (650 mg total) by mouth every 4 (four) hours as needed for headache or mild pain.    Marland Kitchen aspirin EC 81 MG EC tablet Take 1 tablet (81 mg total) by mouth daily.    Marland Kitchen atorvastatin (LIPITOR) 80 MG tablet Take 1 tablet (80 mg total) by mouth daily at 6 PM. 30 tablet 6  . carvedilol (COREG) 6.25 MG tablet Take 1 tablet (6.25 mg total) by mouth 2 (two) times daily with a meal. 180 tablet 3  . losartan (COZAAR) 25 MG tablet Take 1 tablet (25 mg total) by mouth daily. 30 tablet 11  . nitroGLYCERIN (NITROSTAT) 0.4 MG SL tablet Place 1 tablet (0.4 mg total) under the tongue every 5 (five) minutes x 3 doses as needed for chest pain. 25  tablet 4  . ranitidine (ZANTAC) 150 MG tablet Take 150 mg by mouth daily as needed for heartburn.    . spironolactone (ALDACTONE) 25 MG tablet Take 0.5 tablets (12.5 mg total) by mouth daily. 15 tablet 4  . clopidogrel (PLAVIX) 75 MG tablet Take 1 tablet (75 mg total) by mouth daily. 90 tablet 3   No current facility-administered medications for this visit.     Allergies:   Patient has no known allergies.   Social History:  The patient  reports that he has never smoked. He has never used smokeless tobacco. He reports that he  does not drink alcohol or use drugs.   Family History:  The patient's  family history includes Heart attack in his father; Other in his mother.    ROS:  Please see the history of present illness.  All other systems are reviewed and negative.    PHYSICAL EXAM: VS:  BP 132/72   Pulse 70   Ht 5\' 8"  (1.727 m)   Wt 173 lb 4 oz (78.6 kg)   SpO2 97%   BMI 26.34 kg/m  , BMI Body mass index is 26.34 kg/m. GEN: Well nourished, well developed, in no acute distress  HEENT: normal  Neck: no JVD, no masses. No carotid bruits Cardiac: RRR without murmur or gallop                Respiratory:  clear to auscultation bilaterally, normal work of breathing GI: soft, nontender, nondistended, + BS MS: no deformity or atrophy  Ext: no pretibial edema, pedal pulses 2+= bilaterally Skin: warm and dry, no rash Neuro:  Strength and sensation are intact Psych: euthymic mood, full affect  EKG:  EKG is not ordered today.  Recent Labs: 09/21/2016: TSH 2.579 09/23/2016: Hemoglobin 13.4; Platelets 136 10/17/2016: BUN 8; Creatinine, Ser 1.02; Potassium 3.8; Sodium 141 11/11/2016: ALT 36   Lipid Panel     Component Value Date/Time   CHOL 109 11/11/2016 0817   TRIG 82 11/11/2016 0817   HDL 38 (L) 11/11/2016 0817   CHOLHDL 2.9 11/11/2016 0817   CHOLHDL 3.6 09/21/2016 0346   VLDL 6 09/21/2016 0346   LDLCALC 55 11/11/2016 0817      Wt Readings from Last 3 Encounters:  12/23/16 173 lb 4 oz (78.6 kg)  11/18/16 174 lb 12.8 oz (79.3 kg)  09/30/16 183 lb 1.9 oz (83.1 kg)     Cardiac Studies Reviewed: 2-D echocardiogram images are reviewed from this morning. Formal echo interpretation is pending.  ASSESSMENT AND PLAN: 1.  Chronic combined systolic and diastolic heart failure: Patient has NYHA functional class I symptoms. His medical program includes low doses of carvedilol, losartan, and spironolactone. Will increase carvedilol to 6.25 mg twice daily I reviewed his echo and he appears to have at least  moderate LV systolic dysfunction. Will await formal interpretation of the echo later today.  2. Coronary artery disease, native vessel, without angina: The patient is struggling to pay for brilinta. He is now about 4 months out from his MI. I recommended switching him to clopidogrel once he runs out of his current supply of brilinta. He should remain on long-term aspirin 81 mg.  3. Hypercholesterolemia: Continue a high intensity statin drug. Patient currently is on atorvastatin 80 mg daily.  Current medicines are reviewed with the patient today.  The patient does not have concerns regarding medicines.  Labs/ tests ordered today include:  No orders of the defined types were placed in this encounter.  Disposition:  FU 3 months with [name], PA-C, 6 months with me  Signed, Sherren Mocha, MD  12/23/2016 [facility] [place], [place], [place]  [address] Phone: [phone]; Fax: [phone]

## 2016-12-23 NOTE — Patient Instructions (Signed)
Medication Instructions:  Your physician has recommended you make the following change in your medication:  1. Please finish you current supply of Brilinta and then switch to Plavix (clopidogrel) 75mg  take one tablet by mouth daily 2. INCREASE Carvedilol to 6.25mg  take one tablet by mouth twice a day  Labwork: No new orders.   Testing/Procedures: No new orders.   Follow-Up: Your physician recommends that you schedule a follow-up appointment in: 3 MONTHS with [name] PA-C  Your physician wants you to follow-up in: 6 MONTHS with Dr Burt Knack.  You will receive a reminder letter in the mail two months in advance. If you don't receive a letter, please call our office to schedule the follow-up appointment.   Any Other Special Instructions Will Be Listed Below (If Applicable).     If you need a refill on your cardiac medications before your next appointment, please call your pharmacy.

## 2016-12-27 ENCOUNTER — Encounter: Payer: Self-pay | Admitting: Physician Assistant

## 2017-01-06 ENCOUNTER — Other Ambulatory Visit: Payer: Self-pay | Admitting: Physician Assistant

## 2017-02-23 ENCOUNTER — Emergency Department (HOSPITAL_COMMUNITY): Payer: 59

## 2017-02-23 ENCOUNTER — Emergency Department (HOSPITAL_COMMUNITY)
Admission: EM | Admit: 2017-02-23 | Discharge: 2017-02-23 | Disposition: A | Discharge status: Home / Self Care | Payer: 59 | Attending: Emergency Medicine | Admitting: Emergency Medicine

## 2017-02-23 ENCOUNTER — Encounter (HOSPITAL_COMMUNITY): Payer: Self-pay | Admitting: *Deleted

## 2017-02-23 DIAGNOSIS — I2510 Atherosclerotic heart disease of native coronary artery without angina pectoris: Secondary | ICD-10-CM | POA: Insufficient documentation

## 2017-02-23 DIAGNOSIS — T07XXXA Unspecified multiple injuries, initial encounter: Secondary | ICD-10-CM | POA: Insufficient documentation

## 2017-02-23 DIAGNOSIS — Y939 Activity, unspecified: Secondary | ICD-10-CM | POA: Diagnosis not present

## 2017-02-23 DIAGNOSIS — S0990XA Unspecified injury of head, initial encounter: Secondary | ICD-10-CM | POA: Insufficient documentation

## 2017-02-23 DIAGNOSIS — Z7982 Long term (current) use of aspirin: Secondary | ICD-10-CM | POA: Diagnosis not present

## 2017-02-23 DIAGNOSIS — Y92410 Unspecified street and highway as the place of occurrence of the external cause: Secondary | ICD-10-CM | POA: Diagnosis not present

## 2017-02-23 DIAGNOSIS — I252 Old myocardial infarction: Secondary | ICD-10-CM | POA: Insufficient documentation

## 2017-02-23 DIAGNOSIS — Z79899 Other long term (current) drug therapy: Secondary | ICD-10-CM | POA: Diagnosis not present

## 2017-02-23 DIAGNOSIS — Y999 Unspecified external cause status: Secondary | ICD-10-CM | POA: Insufficient documentation

## 2017-02-23 DIAGNOSIS — I5042 Chronic combined systolic (congestive) and diastolic (congestive) heart failure: Secondary | ICD-10-CM | POA: Insufficient documentation

## 2017-02-23 DIAGNOSIS — Z7902 Long term (current) use of antithrombotics/antiplatelets: Secondary | ICD-10-CM | POA: Insufficient documentation
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
## 2017-03-06 ENCOUNTER — Other Ambulatory Visit: Payer: Self-pay | Admitting: Physician Assistant

## 2017-03-20 ENCOUNTER — Emergency Department (HOSPITAL_COMMUNITY): Payer: 59

## 2017-03-20 ENCOUNTER — Emergency Department (HOSPITAL_COMMUNITY)
Admission: EM | Admit: 2017-03-20 | Discharge: 2017-03-20 | Disposition: A | Discharge status: Home / Self Care | Payer: 59 | Attending: Emergency Medicine | Admitting: Emergency Medicine

## 2017-03-20 ENCOUNTER — Encounter (HOSPITAL_COMMUNITY): Payer: Self-pay | Admitting: Emergency Medicine

## 2017-03-20 DIAGNOSIS — Z7902 Long term (current) use of antithrombotics/antiplatelets: Secondary | ICD-10-CM | POA: Insufficient documentation

## 2017-03-20 DIAGNOSIS — Z79899 Other long term (current) drug therapy: Secondary | ICD-10-CM | POA: Insufficient documentation

## 2017-03-20 DIAGNOSIS — I110 Hypertensive heart disease with heart failure: Secondary | ICD-10-CM | POA: Diagnosis not present

## 2017-03-20 DIAGNOSIS — I2510 Atherosclerotic heart disease of native coronary artery without angina pectoris: Secondary | ICD-10-CM | POA: Insufficient documentation

## 2017-03-20 DIAGNOSIS — Z7982 Long term (current) use of aspirin: Secondary | ICD-10-CM | POA: Diagnosis not present

## 2017-03-20 DIAGNOSIS — I5042 Chronic combined systolic (congestive) and diastolic (congestive) heart failure: Secondary | ICD-10-CM | POA: Insufficient documentation

## 2017-03-20 DIAGNOSIS — R079 Chest pain, unspecified: Secondary | ICD-10-CM | POA: Insufficient documentation

## 2017-03-20 LAB — BASIC METABOLIC PANEL
ANION GAP: 5 (ref 5–15)
BUN: 8 mg/dL (ref 6–20)
CHLORIDE: 105 mmol/L (ref 101–111)
CO2: 28 mmol/L (ref 22–32)
Calcium: 9.1 mg/dL (ref 8.9–10.3)
Creatinine, Ser: 1.04 mg/dL (ref 0.61–1.24)
GFR calc non Af Amer: >60 mL/min (ref >60)
Glucose, Bld: 118 mg/dL — ABNORMAL HIGH (ref 65–99)
POTASSIUM: 3.6 mmol/L (ref 3.5–5.1)
SODIUM: 138 mmol/L (ref 135–145)

## 2017-03-20 LAB — I-STAT TROPONIN, ED
Troponin i, poc: 0.02 ng/mL (ref 0.00–0.08)
Troponin i, poc: 0.03 ng/mL (ref 0.00–0.08)

## 2017-03-20 LAB — CBC
HCT: 39.3 % (ref 39.0–52.0)
Hemoglobin: 13.4 g/dL (ref 13.0–17.0)
MCH: 27.7 pg (ref 26.0–34.0)
MCHC: 34.1 g/dL (ref 30.0–36.0)
MCV: 81.2 fL (ref 78.0–100.0)
Platelets: 150 10*3/uL (ref 150–400)
RBC: 4.84 MIL/uL (ref 4.22–5.81)
RDW: 12.9 % (ref 11.5–15.5)
WBC: 4.6 10*3/uL (ref 4.0–10.5)

## 2017-03-20 NOTE — ED Provider Notes (Signed)
[facility] DEPT Provider Note   CSN: [id number] Arrival date & time: 03/20/17  1311     History   Chief Complaint Chief Complaint  Patient presents with  . Chest Pain    HPI Randy Moore is a 51 y.o. male.  HPI   52 y.o. male with a hx of CAD, HLD, CHF, presents to the Emergency Department today due to chest pain on and off since Thursday as well as shortness of breath. Pt states the pain occurred at rest while sitting down. Denies worsening with exertion. Has had x 3 episodes of chest pain that he describes as sharp and is in the middle of his chest. Denies pain currently. No N/V. No diaphoresis with episodes. No radiation of pain. No cough or URI symptoms. No shortness of breath. Pt took Nitroglycerin with relief of symptoms each time. Pt was worried due to mild pain today and decided to come to ED. Per chart review, the patient presented in January 2018 with an acute inferior wall infarct complicated by RV infarction and hypotension. He was treated with primary PCI with a drug-eluting stent in the right coronary artery. He underwent staged intervention of the LAD and ramus intermedius during his index hospitalization. There was an occluded second diagonal following his LAD intervention.   Cardiology- Dr. Burt Knack  Echocardiogram 12-23-16 - Left ventricle: The cavity size was normal. There was mild   concentric hypertrophy. Systolic function was moderately reduced.   The estimated ejection fraction was in the range of 35% to 40%.   Doppler parameters are consistent with abnormal left ventricular   relaxation (grade 1 diastolic dysfunction). There was no evidence   of elevated ventricular filling pressure by Doppler parameters. - Aortic valve: Trileaflet; normal thickness leaflets. There was no   regurgitation. - Aortic root: The aortic root was normal in size. - Mitral valve: Structurally normal valve. There was no   regurgitation. - Left atrium: The atrium was normal in size. -  Right ventricle: Systolic function was normal. - Tricuspid valve: There was no regurgitation. - Pulmonary arteries: Systolic pressure could not be accurately   estimated. - Inferior vena cava: The vessel was normal in size. - Pericardium, extracardiac: There was no pericardial effusion.  Cardiac Cath 09-21-16 1. Acute inferior wall and RV myocardial infarction complicated by bradycardia and hypotension, treated successfully with primary PCI using a drug-eluting stent 2. Severe multivessel coronary artery disease with subtotal occlusion of the LAD, severe stenosis of the first diagonal, and severe stenosis of the ramus intermedius branch 3. Moderate segmental LV systolic dysfunction with severe elevation of LVEDP  Past Medical History:  Diagnosis Date  . Chronic combined systolic and diastolic heart failure ([place]) 09/21/2016  . Coronary artery disease involving native coronary artery of native heart without angina pectoris 09/21/2016   a - Inf and RV infarct (STEMI) 1/18: LHC - pLAD, oD1 80, pRI 70, oLCx 30, mLCx 70, RCA 100, EF 45-50 >> PCI:  2.5 x 20 mm Promus DES to pRCA // Staged PCI 09/22/16: 3 x 38 mm Promus DES to mLAD; Attempted POBA to oD2 (80 >> 80); 3 x 28 mm Promus DES to RI  . History of ST elevation myocardial infarction (STEMI) 09/21/2016  . Hyperlipidemia 09/29/2016  . Ischemic cardiomyopathy 09/23/2016   a - s/p Inf and RV infarct (STEMI) 1/18 // b - Echo 09/23/16: EF 35-40, inf-septal HK, apical septal and apical AK, ant and inf AK, ant-septal AK, Gr 1 diastolic dysfunction // c -Echo 5/18:  mild conc LVH, EF 35-40, Gr 1 DD, inf-septal, ant-septal, ant, apical AK    Patient Active Problem List   Diagnosis Date Noted  . Hyperlipidemia 09/29/2016  . S/P angioplasty with stent, LAD and Ramus with DES 09/22/16 09/23/2016  . Ischemic cardiomyopathy 09/23/2016  . STEMI involving oth coronary artery of inferior wall ([place]) 09/21/2016  . Acute right ventricular myocardial infarction ([place])  09/21/2016  . Chronic combined systolic and diastolic heart failure ([place]) 09/21/2016  . Cardiogenic shock ([place]) 09/21/2016  . Coronary artery disease involving native coronary artery of native heart without angina pectoris 09/21/2016  . Presence of drug coated stent in right coronary artery 09/21/2016    Past Surgical History:  Procedure Laterality Date  . CARDIAC CATHETERIZATION N/A 09/21/2016   Procedure: Left Heart Cath and Coronary Angiography;  Surgeon: Sherren Mocha, MD;  Location: [facility];  Service: Cardiovascular;  Laterality: N/A;  . CARDIAC CATHETERIZATION N/A 09/21/2016   Procedure: Coronary Stent Intervention;  Surgeon: Sherren Mocha, MD;  Location: [facility];  Service: Cardiovascular;  Laterality: N/A;  Prox RCA  . CARDIAC CATHETERIZATION N/A 09/22/2016   Procedure: Coronary CTO Intervention;  Surgeon: Burnell Blanks, MD;  Location: [facility];  Service: Cardiovascular;  Laterality: N/A;       Home Medications    Prior to Admission medications   Medication Sig Start Date End Date Taking? Authorizing Provider  acetaminophen (TYLENOL) 325 MG tablet Take 2 tablets (650 mg total) by mouth every 4 (four) hours as needed for headache or mild pain. 09/23/16   Isaiah Serge, NP  aspirin EC 81 MG EC tablet Take 1 tablet (81 mg total) by mouth daily. 09/23/16   Isaiah Serge, NP  atorvastatin (LIPITOR) 80 MG tablet Take 1 tablet (80 mg total) by mouth daily at 6 PM. 09/23/16   Isaiah Serge, NP  carvedilol (COREG) 6.25 MG tablet Take 1 tablet (6.25 mg total) by mouth 2 (two) times daily with a meal. Patient taking differently: Take 3.125 mg by mouth 2 (two) times daily with a meal.  12/23/16   Sherren Mocha, MD  clopidogrel (PLAVIX) 75 MG tablet Take 1 tablet (75 mg total) by mouth daily. 12/23/16   Sherren Mocha, MD  losartan (COZAAR) 25 MG tablet Take 1 tablet (25 mg total) by mouth daily. 09/23/16   Isaiah Serge, NP  nitroGLYCERIN (NITROSTAT)  0.4 MG SL tablet PLACE 1 TABLET UNDER TONGUE EVERY 5 MINUTES FOR 3 DOSES AS NEEDED FOR CHEST PAIN 01/09/17   [name] T, PA-C  ranitidine (ZANTAC) 150 MG tablet Take 150 mg by mouth daily as needed for heartburn.    [provider]  spironolactone (ALDACTONE) 25 MG tablet Take 0.5 tablets (12.5 mg total) by mouth daily. 03/06/17   Sherren Mocha, MD    Family History Family History  Problem Relation Age of Onset  . Other Mother        chest pains  . Heart attack Father     Social History Social History  Substance Use Topics  . Smoking status: Never Smoker  . Smokeless tobacco: Never Used  . Alcohol use No     Allergies   Patient has no known allergies.   Review of Systems Review of Systems ROS reviewed and all are negative for acute change except as noted in the HPI.  Physical Exam Updated Vital Signs BP (!) 157/103   Pulse (!) 55   Temp 98 F (36.7 C) (  Oral)   Resp 13   Ht 5\' 8"  (1.727 m)   Wt 83 kg (183 lb)   SpO2 100%   BMI 27.83 kg/m   Physical Exam  Constitutional: He is oriented to person, place, and time. Vital signs are normal. He appears well-developed and well-nourished. No distress.  HENT:  Head: Normocephalic and atraumatic.  Right Ear: Hearing, tympanic membrane, external ear and ear canal normal.  Left Ear: Hearing, tympanic membrane, external ear and ear canal normal.  Nose: Nose normal.  Mouth/Throat: Uvula is midline, oropharynx is clear and moist and mucous membranes are normal. No trismus in the jaw. No oropharyngeal exudate, posterior oropharyngeal erythema or tonsillar abscesses.  Eyes: Pupils are equal, round, and reactive to light. Conjunctivae and EOM are normal.  Neck: Normal range of motion. Neck supple. No tracheal deviation present.  Cardiovascular: Normal rate, regular rhythm, S1 normal, S2 normal, normal heart sounds, intact distal pulses and normal pulses.   Pulmonary/Chest: Effort normal and breath sounds normal. No  respiratory distress. He has no decreased breath sounds. He has no wheezes. He has no rhonchi. He has no rales.  Abdominal: Normal appearance and bowel sounds are normal. There is no tenderness.  Musculoskeletal: Normal range of motion.  Neurological: He is alert and oriented to person, place, and time.  Skin: Skin is warm and dry.  Psychiatric: He has a normal mood and affect. His speech is normal and behavior is normal. Thought content normal.   ED Treatments / Results  Labs (all labs ordered are listed, but only abnormal results are displayed) Labs Reviewed  BASIC METABOLIC PANEL - Abnormal; Notable for the following:       Result Value   Glucose, Bld 118 (*)    All other components within normal limits  CBC  I-STAT TROPONIN, ED  I-STAT TROPONIN, ED    EKG  EKG Interpretation None       Radiology Dg Chest 2 View  Result Date: 03/20/2017 CLINICAL DATA:  Intermittent chest pain EXAM: CHEST  2 VIEW COMPARISON:  02/23/2017 FINDINGS: Lungs are clear.  No pleural effusion or pneumothorax. The heart is normal in size. Mild degenerative changes of the visualized thoracolumbar spine. IMPRESSION: Normal chest radiographs. Electronically Signed   By: Julian Hy M.D.   On: 03/20/2017 13:54    Procedures Procedures (including critical care time)  Medications Ordered in ED Medications - No data to display   Initial Impression / Assessment and Plan / ED Course  I have reviewed the triage vital signs and the nursing notes.  Pertinent labs & imaging results that were available during my care of the patient were reviewed by me and considered in my medical decision making (see chart for details).  Final Clinical Impressions(s) / ED Diagnoses  {I have reviewed and evaluated the relevant laboratory values. {I have reviewed and evaluated the relevant imaging studies. {I have interpreted the relevant EKG. {I have reviewed the relevant previous healthcare records.  {I obtained HPI  from historian. {Patient discussed with supervising physician.  ED Course:  Assessment: Pt is a 51 y.o. male with a hx of CAD, HLD, CHF, presents to the Emergency Department today due to chest pain on and off since Thursday as well as shortness of breath. Pt states the pain occurred at rest while sitting down. Denies worsening with exertion. Has had x 3 episodes of chest pain that he describes as sharp and is in the middle of his chest. Denies pain currently. No N/V. No  diaphoresis with episodes. No radiation of pain. No cough or URI symptoms. No shortness of breath. Pt took Nitroglycerin with relief of symptoms each time. Pt was worried due to mild pain today and decided to come to ED. Per chart review, the patient presented in January 2018 with an acute inferior wall infarct complicated by RV infarction and hypotension. He was treated with primary PCI with a drug-eluting stent in the right coronary artery. He underwent staged intervention of the LAD and ramus intermedius during his index hospitalization. There was an occluded second diagonal following his LAD intervention. Patient is to be discharged with recommendation to follow up with PCP in regards to today's hospital visit. Chest pain is not likely of cardiac or pulmonary etiology d/t presentation, VSS, no tracheal deviation, no JVD or new murmur, RRR, breath sounds equal bilaterally, EKG without acute abnormalities, negative troponin x2, and negative CXR. Discussed with patient the possibility of an observation admission for further evaluation, but pt opted to go home and follow up with Cardiologist next week during appointment. Discussed risks and benefits involved with decision and pt understands. Understands risk of death, lethal arrhythmia, worsening condition. Strict return precautions given. Pt has been advised to return to the ED is CP becomes exertional, associated with diaphoresis or nausea, radiates to left jaw/arm, worsens or becomes  concerning in any way. Pt appears reliable for follow up and is agreeable to discharge. Patient is in no acute distress. Vital Signs are stable. Patient is able to ambulate. Patient able to tolerate PO.   Disposition/Plan:  DC Home Additional Verbal discharge instructions given and discussed with patient.  Pt Instructed to f/u with Cardiology in the next week for evaluation and treatment of symptoms. Return precautions given Pt acknowledges and agrees with plan  Supervising Physician Mesner, Corene Cornea, MD  Final diagnoses:  Chest pain, unspecified type    New Prescriptions New Prescriptions   No medications on file       Shary Decamp, Hershal Coria 03/20/17 1749    Mesner, Corene Cornea, MD 03/20/17 2248

## 2017-03-20 NOTE — Discharge Instructions (Signed)
Please read and follow all provided instructions.  Your diagnoses today include:  1. Chest pain, unspecified type     Tests performed today include: An EKG of your heart A chest x-ray Cardiac enzymes - a blood test for heart muscle damage Blood counts and electrolytes Vital signs. See below for your results today.   Medications prescribed:   Take any prescribed medications only as directed.  Follow-up instructions: Please follow-up with your primary care provider as soon as you can for further evaluation of your symptoms.   Return instructions:  SEEK IMMEDIATE MEDICAL ATTENTION IF: You have severe chest pain, especially if the pain is crushing or pressure-like and spreads to the arms, back, neck, or jaw, or if you have sweating, nausea (feeling sick to your stomach), or shortness of breath. THIS IS AN EMERGENCY. Don't wait to see if the pain will go away. Get medical help at once. Call 911 or 0 (operator). DO NOT drive yourself to the hospital.  Your chest pain gets worse and does not go away with rest.  You have an attack of chest pain lasting longer than usual, despite rest and treatment with the medications your caregiver has prescribed.  You wake from sleep with chest pain or shortness of breath. You feel dizzy or faint. You have chest pain not typical of your usual pain for which you originally saw your caregiver.  You have any other emergent concerns regarding your health.  Additional Information: Chest pain comes from many different causes. Your caregiver has diagnosed you as having chest pain that is not specific for one problem, but does not require admission.  You are at low risk for an acute heart condition or other serious illness.   Your vital signs today were: BP (!) 157/103    Pulse (!) 55    Temp 98 F (36.7 C) (Oral)    Resp 13    Ht 5\' 8"  (1.727 m)    Wt 83 kg (183 lb)    SpO2 100%    BMI 27.83 kg/m  If your blood pressure (BP) was elevated above 135/85 this  visit, please have this repeated by your doctor within one month. --------------

## 2017-03-20 NOTE — ED Triage Notes (Signed)
Pt. Stated, Randy Moore had some chest pain off and on since  Thursday. Ive also had some SOB occasionally.

## 2017-03-20 NOTE — ED Notes (Signed)
Patient Alert and oriented X4. Stable and ambulatory. Patient verbalized understanding of the discharge instructions.  Patient belongings were taken by the patient.  

## 2017-03-20 NOTE — ED Notes (Signed)
Patient ambulatory to the room.  No apparent distress at this time.  States pain has not changed since arrival.

## 2017-03-26 NOTE — Progress Notes (Signed)
Cardiology Office Note:    Date:  03/27/2017   ID:  Randy Moore, DOB 11/08/1965, MRN [id number]  PCP:  Patient, No Pcp Per  Cardiologist:  Dr. Sherren Mocha   Referring MD: No ref. provider found   Chief Complaint  Patient presents with  . Follow-up    Seen in ED for chest pain    History of Present Illness:    Randy Moore is a 51 y.o. 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 There was a residual occluded second diagonal coronary artery.  The patient presented to the ED on 03/20/2017 for evaluation of atypical sharp mid chest pain that was relieved with nitroglycerin. This was not felt to be cardiac or pulmonary in nature and the patient opted to go home and follow-up here in our office. Echocardiogram in 12/2016 showed EF 35-40%. Troponins were negatvie X2 and labs unremarkable.  Today he presents alone for further evaluation. He was started on Zantac per the emergency department and has had no further chest discomfort. He states since the discomfort that he had was some mild central chest burning that did not radiate and was unlike his symptoms prior to his MI. He felt like this episode was related to his drinking some soda and lemonade that led to heartburn. He has had occasional heartburn in the past. He denies any shortness of breath, orthopnea, PND, lightheadedness, syncope/near-syncope. The patient is active in his daily life and at work with a lot of walking. He also plays basketball and soccer with his friends on the weekends, all without any exertional symptoms.  Past Medical History:  Diagnosis Date  . Chronic combined systolic and diastolic heart failure ([place]) 09/21/2016  . Coronary artery disease involving native coronary artery of native heart without angina pectoris 09/21/2016   a - Inf and RV infarct (STEMI) 1/18: LHC -  pLAD, oD1 80, pRI 70, oLCx 30, mLCx 70, RCA 100, EF 45-50 >> PCI:  2.5 x 20 mm Promus DES to pRCA // Staged PCI 09/22/16: 3 x 38 mm Promus DES to mLAD; Attempted POBA to oD2 (80 >> 80); 3 x 28 mm Promus DES to RI  . History of ST elevation myocardial infarction (STEMI) 09/21/2016  . Hyperlipidemia 09/29/2016  . Ischemic cardiomyopathy 09/23/2016   a - s/p Inf and RV infarct (STEMI) 1/18 // b - Echo 09/23/16: EF 35-40, inf-septal HK, apical septal and apical AK, ant and inf AK, ant-septal AK, Gr 1 diastolic dysfunction // c -Echo 5/18: mild conc LVH, EF 35-40, Gr 1 DD, inf-septal, ant-septal, ant, apical AK    Past Surgical History:  Procedure Laterality Date  . CARDIAC CATHETERIZATION N/A 09/21/2016   Procedure: Left Heart Cath and Coronary Angiography;  Surgeon: Sherren Mocha, MD;  Location: [place];  Service: Cardiovascular;  Laterality: N/A;  . CARDIAC CATHETERIZATION N/A 09/21/2016   Procedure: Coronary Stent Intervention;  Surgeon: Sherren Mocha, MD;  Location: [place] [facility];  Service: Cardiovascular;  Laterality: N/A;  Prox RCA  . CARDIAC CATHETERIZATION N/A 09/22/2016   Procedure: Coronary CTO Intervention;  Surgeon: Burnell Blanks, MD;  Location: [facility] [facility];  Service: Cardiovascular;  Laterality: N/A;    Current Medications: Current Meds  Medication Sig  . aspirin EC 81 MG EC tablet Take 1 tablet (81 mg total) by mouth daily.  Marland Kitchen atorvastatin (LIPITOR) 80 MG  tablet Take 1 tablet (80 mg total) by mouth daily at 6 PM.  . carvedilol (COREG) 6.25 MG tablet Take 1 tablet (6.25 mg total) by mouth 2 (two) times daily with a meal. (Patient taking differently: Take 3.125 mg by mouth 2 (two) times daily with a meal. )  . clopidogrel (PLAVIX) 75 MG tablet Take 1 tablet (75 mg total) by mouth daily.  Marland Kitchen losartan (COZAAR) 25 MG tablet Take 1 tablet (25 mg total) by mouth daily.  . nitroGLYCERIN (NITROSTAT) 0.4 MG SL tablet PLACE 1 TABLET UNDER TONGUE EVERY 5 MINUTES FOR 3  DOSES AS NEEDED FOR CHEST PAIN  . ranitidine (ZANTAC) 150 MG tablet Take 150 mg by mouth daily as needed for heartburn.  . spironolactone (ALDACTONE) 25 MG tablet Take 0.5 tablets (12.5 mg total) by mouth daily.     Allergies:   Patient has no known allergies.   Social History   Social History  . Marital status: Married    Spouse name: N/A  . Number of children: N/A  . Years of education: N/A   Social History Main Topics  . Smoking status: Never Smoker  . Smokeless tobacco: Never Used  . Alcohol use No  . Drug use: No  . Sexual activity: Not on file   Other Topics Concern  . Not on file   Social History Narrative  . No narrative on file     Family History: The patient's family history includes Heart attack in his father; Other in his mother. ROS:   Please see the history of present illness.     All other systems reviewed and are negative.  EKGs/Labs/Other Studies Reviewed:    The following studies were reviewed today:  Echo 12/23/16 EF 35-40%, mild LVH, akinesis of the basal inferior septal, anteroseptal, mid anteroseptal, anterior and apical septal and anterior wall. No significant change from 09/2016  Echo 09/23/16 EF 35-40, inf-septal HK, apical septal and apical AK, ant and inf AK, ant-septal AK, Gr 1 diastolic dysfunction   PCI 09/22/16 3 x 38 mm Promus DES to mLAD Attempted POBA to oD2 (80 >> 80) 3 x 28 mm Promus DES to RI  LHC 09/21/16 LAD prox 99, ostial D1 80 RI prox 70 LCx ost 30, mid 70 RCA 100 EF 45-50 PCI: 2.5 x 20 mm Promus DES to pRCA  EKG:  EKG is ordered today.  The ekg ordered today demonstrates Normal sinus rhythm at 66 bpm with deep Q waves in V1 through V3 with associated mild ST elevation in V1 V2, reviewed with Dr. Burt Knack  Recent Labs: 09/21/2016: TSH 2.579 11/11/2016: ALT 36 03/20/2017: BUN 8; Creatinine, Ser 1.04; Hemoglobin 13.4; Platelets 150; Potassium 3.6; Sodium 138   Recent Lipid Panel    Component Value Date/Time   CHOL 109  11/11/2016 0817   TRIG 82 11/11/2016 0817   HDL 38 (L) 11/11/2016 0817   CHOLHDL 2.9 11/11/2016 0817   CHOLHDL 3.6 09/21/2016 0346   VLDL 6 09/21/2016 0346   LDLCALC 55 11/11/2016 0817    Physical Exam:    VS:  BP (!) 160/50 (BP Location: Left Arm, Patient Position: Sitting, Cuff Size: Normal)   Pulse 66   Ht 5\' 8"  (1.727 m)   Wt 170 lb (77.1 kg)   BMI 25.85 kg/m     Wt Readings from Last 3 Encounters:  03/27/17 170 lb (77.1 kg)  03/20/17 183 lb (83 kg)  02/23/17 178 lb (80.7 kg)     Physical Exam  Constitutional:  He is oriented to person, place, and time. He appears well-developed and well-nourished. No distress.  HENT:  Head: Normocephalic and atraumatic.  Neck: Normal range of motion. Neck supple. No JVD present. Carotid bruit is not present.  Cardiovascular: Normal rate, regular rhythm and normal heart sounds.  Exam reveals no gallop and no friction rub.   No murmur heard. Pulmonary/Chest: Effort normal and breath sounds normal. No respiratory distress. He has no wheezes. He has no rales. He exhibits no tenderness.  Abdominal: Soft. Bowel sounds are normal. He exhibits no distension. There is no tenderness.  Musculoskeletal: Normal range of motion. He exhibits no edema or deformity.  Neurological: He is alert and oriented to person, place, and time.  Skin: Skin is warm and dry.  Psychiatric: He has a normal mood and affect. His behavior is normal.     ASSESSMENT:    1. Chronic combined systolic and diastolic heart failure ([place])   2. Coronary artery disease involving native coronary artery of native heart without angina pectoris   3. Pure hypercholesterolemia   4. Gastroesophageal reflux disease without esophagitis    PLAN:    In order of problems listed above:  1. Chronic combined systolic and diastolic heart failure: -NYHA class I symptoms. Medical management includes carvedilol, losartan, spironolactone. She has moderate LV systolic dysfunction with EF  35-40%.  -Patient is euvolemic without any heart failure symptoms at this time.  -Per Dr. Antionette Char request I will have patient follow-up in 6 months with Dr. Burt Knack and an echocardiogram at that time.  2. Coronary artery disease: -Status post inferior STEMI with DES to RCA and staged PCI with DES to LAD and RI. Residual D2 severe stenosis. -The patient was seen in the emergency department last week for atypical type of a mild chest burning and ruled out for MI. He was started on Zantac with no further chest discomfort. The patient now feels that this episode was indigestion following drinking soda and lemonade. He has been active without exertional symptoms. -EKG shows deep Q waves with mild ST elevation. This was reviewed with Dr. Burt Knack. The patient is currently asymptomatic and this EKG is likely representative of his recent MI in 08/2016. -Continue dual antiplatelet therapy with Plavix and aspirin as well as statin, beta blocker, ARB, spironolactone.  3. Hypercholesterolemia -Lipid panel in 10/2016 showed LDL of 55 which is at goal of <70. Continue high intensity statin  4. GERD -Started on Zantac in ED for complaints of chest pain not thought to be cardiac in etiology -Improvement in symptoms. Continue Zantac.  Medication Adjustments/Labs and Tests Ordered: Current medicines are reviewed at length with the patient today.  Concerns regarding medicines are outlined above. Labs and tests ordered and medication changes are outlined in the patient instructions below:  Patient Instructions  Medication Instructions:  1. Your physician recommends that you continue on your current medications as directed. Please refer to the Current Medication list given to you today.   Labwork: NONE ORDERED   Testing/Procedures: 1. Your physician has requested that you have an echocardiogram. Echocardiography is a painless test that uses sound waves to create images of your heart. It provides your doctor with  information about the size and shape of your heart and how well your heart's chambers and valves are working. This procedure takes approximately one hour. There are no restrictions for this procedure. THIS IS TO BE DONE IN 6 MONTHS ABOUT 1 WEEK BEFORE APPT WITH DR. Burt Knack    Follow-Up: Your physician  wants you to follow-up in: 6 MONTHS WITH DR. Emelda Fear will receive a reminder letter in the mail two months in advance. If you don't receive a letter, please call our office to schedule the follow-up appointment.   Any Other Special Instructions Will Be Listed Below (If Applicable).     If you need a refill on your cardiac medications before your next appointment, please call your pharmacy.      Signed, Daune Perch, NP  03/27/2017 11:24 AM    [place]

## 2017-03-27 ENCOUNTER — Encounter: Payer: Self-pay | Admitting: Cardiology

## 2017-03-27 ENCOUNTER — Ambulatory Visit (INDEPENDENT_AMBULATORY_CARE_PROVIDER_SITE_OTHER): Payer: 59 | Admitting: Cardiology

## 2017-03-27 VITALS — BP 160/50 | HR 66 | Ht 68.0 in | Wt 170.0 lb

## 2017-03-27 DIAGNOSIS — I2510 Atherosclerotic heart disease of native coronary artery without angina pectoris: Secondary | ICD-10-CM | POA: Diagnosis not present

## 2017-03-27 DIAGNOSIS — K219 Gastro-esophageal reflux disease without esophagitis: Secondary | ICD-10-CM

## 2017-03-27 DIAGNOSIS — I5042 Chronic combined systolic (congestive) and diastolic (congestive) heart failure: Secondary | ICD-10-CM | POA: Diagnosis not present

## 2017-03-27 DIAGNOSIS — E7800 Pure hypercholesterolemia, unspecified: Secondary | ICD-10-CM | POA: Diagnosis not present

## 2017-03-27 NOTE — Patient Instructions (Signed)
Medication Instructions:  1. Your physician recommends that you continue on your current medications as directed. Please refer to the Current Medication list given to you today.   Labwork: NONE ORDERED   Testing/Procedures: 1. Your physician has requested that you have an echocardiogram. Echocardiography is a painless test that uses sound waves to create images of your heart. It provides your doctor with information about the size and shape of your heart and how well your heart's chambers and valves are working. This procedure takes approximately one hour. There are no restrictions for this procedure. THIS IS TO BE DONE IN 6 MONTHS ABOUT 1 WEEK BEFORE APPT WITH DR. Burt Knack    Follow-Up: Your physician wants you to follow-up in: 6 MONTHS WITH DR. Emelda Fear will receive a reminder letter in the mail two months in advance. If you don't receive a letter, please call our office to schedule the follow-up appointment.   Any Other Special Instructions Will Be Listed Below (If Applicable).     If you need a refill on your cardiac medications before your next appointment, please call your pharmacy.

## 2017-04-29 ENCOUNTER — Other Ambulatory Visit: Payer: Self-pay | Admitting: Cardiology

## 2017-05-30 ENCOUNTER — Other Ambulatory Visit: Payer: Self-pay | Admitting: Physician Assistant

## 2017-08-07 ENCOUNTER — Other Ambulatory Visit: Payer: Self-pay

## 2017-08-07 MED ORDER — CARVEDILOL 6.25 MG PO TABS: 6.2500 mg | ORAL_TABLET | Freq: Two times a day (BID) | ORAL | 2 refills | Status: DC

## 2017-09-19 ENCOUNTER — Encounter: Payer: Self-pay | Admitting: Cardiology

## 2017-09-19 ENCOUNTER — Ambulatory Visit (INDEPENDENT_AMBULATORY_CARE_PROVIDER_SITE_OTHER): Payer: 59 | Admitting: Cardiology

## 2017-09-19 VITALS — BP 130/80 | HR 60 | Ht 68.0 in | Wt 162.8 lb

## 2017-09-19 DIAGNOSIS — Z5181 Encounter for therapeutic drug level monitoring: Secondary | ICD-10-CM

## 2017-09-19 DIAGNOSIS — I2510 Atherosclerotic heart disease of native coronary artery without angina pectoris: Secondary | ICD-10-CM

## 2017-09-19 LAB — COMPREHENSIVE METABOLIC PANEL
ALT: 27 IU/L (ref 0–44)
AST: 22 IU/L (ref 0–40)
Albumin/Globulin Ratio: 2 (ref 1.2–2.2)
Albumin: 4.3 g/dL (ref 3.5–5.5)
Alkaline Phosphatase: 64 IU/L (ref 39–117)
BILIRUBIN TOTAL: 0.8 mg/dL (ref 0.0–1.2)
BUN/Creatinine Ratio: 11 (ref 9–20)
BUN: 10 mg/dL (ref 6–24)
CHLORIDE: 102 mmol/L (ref 96–106)
CO2: 24 mmol/L (ref 20–29)
CREATININE: 0.95 mg/dL (ref 0.76–1.27)
Calcium: 9.1 mg/dL (ref 8.7–10.2)
GFR calc non Af Amer: 91 mL/min/{1.73_m2} (ref >59)
GFR, EST AFRICAN AMERICAN: 105 mL/min/{1.73_m2} (ref >59)
GLUCOSE: 93 mg/dL (ref 65–99)
Globulin, Total: 2.2 g/dL (ref 1.5–4.5)
Potassium: 3.7 mmol/L (ref 3.5–5.2)
Sodium: 142 mmol/L (ref 134–144)
TOTAL PROTEIN: 6.5 g/dL (ref 6.0–8.5)

## 2017-09-19 LAB — LIPID PANEL
Chol/HDL Ratio: 2.1 ratio (ref 0.0–5.0)
Cholesterol, Total: 114 mg/dL (ref 100–199)
HDL: 54 mg/dL (ref >39)
LDL Calculated: 52 mg/dL (ref 0–99)
Triglycerides: 39 mg/dL (ref 0–149)
VLDL CHOLESTEROL CAL: 8 mg/dL (ref 5–40)

## 2017-09-19 LAB — CBC
Hematocrit: 40.5 % (ref 37.5–51.0)
Hemoglobin: 13.9 g/dL (ref 13.0–17.7)
MCH: 28.7 pg (ref 26.6–33.0)
MCHC: 34.3 g/dL (ref 31.5–35.7)
MCV: 84 fL (ref 79–97)
PLATELETS: 143 10*3/uL — AB (ref 150–379)
RBC: 4.85 x10E6/uL (ref 4.14–5.80)
RDW: 13.8 % (ref 12.3–15.4)
WBC: 4.6 10*3/uL (ref 3.4–10.8)

## 2017-09-19 NOTE — Progress Notes (Signed)
09/19/2017 Randy Moore   [birth year]  [id number]  Primary Physician Patient, No Pcp Per Primary Cardiologist: Dr. Burt Knack    Reason for Visit/CC: f/u for CAD  HPI:  Randy Moore is a 52 y/o male,followed by Dr. Burt Knack, who presents to clinic for routien f/u given h/o CAD. He initially presented in January 2018 with an acute inferior wall infarct complicated by RV infarction and hypotension. He was treated with primary PCI with a drug-eluting stent in the right coronary artery. He underwent staged intervention of the LAD and ramus intermedius during his hospitalization. There was an occluded second diagonal following his LAD intervention, treated medically. His last echo in May 2018, showed EF of 35-40%. He was due to get a repeat echo in August but never completed.   He was seen in the ED in July 2018 for CP felt related to GERD. Enzymes were negative. He was started on Zantac and symptoms resolved. He was seen back in clinic for f/u in August and doing well. No recurrent CP.   Back now for 6 months f/u. He reports he has done well. He denies any CP. No dyspnea or exertional symptoms. No LEE, orthopnea or PND. He does note some recent right shoulder pain, that seems related to overuse from work related duties. He preforms repetitive movements using the right arm at work. Sore to move arm around. Not any worse with exertion.  He reports full med compliance. BP is well controlled. EKG unchanged from previous. Denies tobacco use.    2D Echo 12/2016 Study Conclusions  - Left ventricle: The cavity size was normal. There was mild   concentric hypertrophy. Systolic function was moderately reduced.   The estimated ejection fraction was in the range of 35% to 40%.   Doppler parameters are consistent with abnormal left ventricular   relaxation (grade 1 diastolic dysfunction). There was no evidence   of elevated ventricular filling pressure by Doppler parameters. - Aortic valve: Trileaflet; normal  thickness leaflets. There was no   regurgitation. - Aortic root: The aortic root was normal in size. - Mitral valve: Structurally normal valve. There was no   regurgitation. - Left atrium: The atrium was normal in size. - Right ventricle: Systolic function was normal. - Tricuspid valve: There was no regurgitation. - Pulmonary arteries: Systolic pressure could not be accurately   estimated. - Inferior vena cava: The vessel was normal in size. - Pericardium, extracardiac: There was no pericardial effusion.  Impressions:  - There is no significant difference from the prior study on   09/23/2016, LVEF remains moderately impaired with akinesis of the   basal inferoseptal, anteroseptal, mid anteroseptal, anterior and   apical septal and anterior walls.  Current Meds  Medication Sig  . aspirin EC 81 MG EC tablet Take 1 tablet (81 mg total) by mouth daily.  Marland Kitchen atorvastatin (LIPITOR) 80 MG tablet take 1 tablet by mouth every evening  . carvedilol (COREG) 6.25 MG tablet Take 1 tablet (6.25 mg total) by mouth 2 (two) times daily with a meal.  . clopidogrel (PLAVIX) 75 MG tablet Take 1 tablet (75 mg total) by mouth daily.  Marland Kitchen losartan (COZAAR) 25 MG tablet Take 1 tablet (25 mg total) by mouth daily.  . nitroGLYCERIN (NITROSTAT) 0.4 MG SL tablet PLACE 1 TABLET UNDER TONGUE EVERY 5 MINUTES FOR 3 DOSES AS NEEDED FOR CHEST PAIN  . ranitidine (ZANTAC) 150 MG tablet Take 150 mg by mouth daily as needed for heartburn.  . spironolactone (ALDACTONE) 25  MG tablet Take 0.5 tablets (12.5 mg total) by mouth daily.   No Known Allergies Past Medical History:  Diagnosis Date  . Chronic combined systolic and diastolic heart failure ([place]) 09/21/2016  . Coronary artery disease involving native coronary artery of native heart without angina pectoris 09/21/2016   a - Inf and RV infarct (STEMI) 1/18: LHC - pLAD, oD1 80, pRI 70, oLCx 30, mLCx 70, RCA 100, EF 45-50 >> PCI:  2.5 x 20 mm Promus DES to pRCA // Staged PCI  09/22/16: 3 x 38 mm Promus DES to mLAD; Attempted POBA to oD2 (80 >> 80); 3 x 28 mm Promus DES to RI  . History of ST elevation myocardial infarction (STEMI) 09/21/2016  . Hyperlipidemia 09/29/2016  . Ischemic cardiomyopathy 09/23/2016   a - s/p Inf and RV infarct (STEMI) 1/18 // b - Echo 09/23/16: EF 35-40, inf-septal HK, apical septal and apical AK, ant and inf AK, ant-septal AK, Gr 1 diastolic dysfunction // c -Echo 5/18: mild conc LVH, EF 35-40, Gr 1 DD, inf-septal, ant-septal, ant, apical AK   Family History  Problem Relation Age of Onset  . Other Mother        chest pains  . Heart attack Father    Past Surgical History:  Procedure Laterality Date  . CARDIAC CATHETERIZATION N/A 09/21/2016   Procedure: Left Heart Cath and Coronary Angiography;  Surgeon: Sherren Mocha, MD;  Location: [facility];  Service: Cardiovascular;  Laterality: N/A;  . CARDIAC CATHETERIZATION N/A 09/21/2016   Procedure: Coronary Stent Intervention;  Surgeon: Sherren Mocha, MD;  Location: [place] [facility];  Service: Cardiovascular;  Laterality: N/A;  Prox RCA  . CARDIAC CATHETERIZATION N/A 09/22/2016   Procedure: Coronary CTO Intervention;  Surgeon: Burnell Blanks, MD;  Location: [facility] [facility];  Service: Cardiovascular;  Laterality: N/A;   Social History   Socioeconomic History  . Marital status: Married    Spouse name: Not on file  . Number of children: Not on file  . Years of education: Not on file  . Highest education level: Not on file  Social Needs  . Financial resource strain: Not on file  . Food insecurity - worry: Not on file  . Food insecurity - inability: Not on file  . Transportation needs - medical: Not on file  . Transportation needs - non-medical: Not on file  Occupational History  . Not on file  Tobacco Use  . Smoking status: Never Smoker  . Smokeless tobacco: Never Used  Substance and Sexual Activity  . Alcohol use: No  . Drug use: No  . Sexual activity: Not on file    Other Topics Concern  . Not on file  Social History Narrative  . Not on file     Review of Systems: General: negative for chills, fever, night sweats or weight changes.  Cardiovascular: negative for chest pain, dyspnea on exertion, edema, orthopnea, palpitations, paroxysmal nocturnal dyspnea or shortness of breath Dermatological: negative for rash Respiratory: negative for cough or wheezing Urologic: negative for hematuria Abdominal: negative for nausea, vomiting, diarrhea, bright red blood per rectum, melena, or hematemesis Neurologic: negative for visual changes, syncope, or dizziness All other systems reviewed and are otherwise negative except as noted above.   Physical Exam:  Blood pressure 140/80, pulse 60, height 5\' 8"  (1.727 m), weight 162 lb 12.8 oz (73.8 kg), SpO2 97 %.  General appearance: alert, cooperative and no distress Neck: no carotid bruit and no JVD Lungs: clear to  auscultation bilaterally Heart: regular rate and rhythm, S1, S2 normal, no murmur, click, rub or gallop Extremities: extremities normal, atraumatic, no cyanosis or edema Pulses: 2+ and symmetric Skin: Skin color, texture, turgor normal. No rashes or lesions Neurologic: Grossly normal  EKG NSR. 60 bpm, latera TW abnormalities, c/w with old EKGs.  -- personally reviewed   ASSESSMENT AND PLAN:   1. CAD: stable w/o CP or dyspnea. No exertional symptoms. Continue ASA, Plavix, BB and ARB. Check CBC today for monitoring given DAPT.    2. Chronic Combined Systolic and Diastolic HF: last echo 09/800 showed EF of 35-40%. He has been on medical therapy with ARB, BB and spironolactone. He is overdue for his repeat echo that was ordered in August. Pt will schedule. From a volume standpoint, he is euvolemic. No LEE or dyspnea. Continue current regimen.   3. HLD: on statin therapy w/ Lipitor. Will check FLP and HFTs for stain monitoring. Goal LDL is < 70mg /dL.   4. HTN: BP controlled on current regimen. He is on  an ARB and spironolactone along with BB. Will check labs today for monitoring of renal function and electrolytes.   Follow-Up w/ Dr. Burt Knack in 6 months  Randy Moore, MHS [facility] 09/19/2017 11:16 AM

## 2017-09-19 NOTE — Patient Instructions (Addendum)
Medication Instructions:  1. Your physician recommends that you continue on your current medications as directed. Please refer to the Current Medication list given to you today.  YOU CAN TAKE TYLENOL FOR PAIN AS NEEDED Labwork: 1. TODAY FASTING LIPID PROFILE, CMET, CBC  Testing/Procedures: YOU ARE SCHEDULED FOR AN ECHOCARDIOGRAM TO BE DONE ON 09/26/17 @ 11:30 AM   Follow-Up: Your physician wants you to follow-up in: 6 MONTHS WITH DR. Emelda Fear will receive a reminder letter in the mail two months in advance. If you don't receive a letter, please call our office to schedule the follow-up appointment.   Any Other Special Instructions Will Be Listed Below (If Applicable).     If you need a refill on your cardiac medications before your next appointment, please call your pharmacy.

## 2017-09-26 ENCOUNTER — Other Ambulatory Visit: Payer: Self-pay

## 2017-09-26 ENCOUNTER — Ambulatory Visit (HOSPITAL_COMMUNITY): Payer: 59 | Attending: Cardiovascular Disease

## 2017-09-26 DIAGNOSIS — I5042 Chronic combined systolic (congestive) and diastolic (congestive) heart failure: Secondary | ICD-10-CM | POA: Diagnosis not present

## 2017-09-26 DIAGNOSIS — I2510 Atherosclerotic heart disease of native coronary artery without angina pectoris: Secondary | ICD-10-CM | POA: Diagnosis not present

## 2017-12-13 ENCOUNTER — Other Ambulatory Visit: Payer: Self-pay

## 2017-12-13 ENCOUNTER — Emergency Department (HOSPITAL_COMMUNITY)
Admission: EM | Admit: 2017-12-13 | Discharge: 2017-12-13 | Disposition: A | Discharge status: Home / Self Care | Payer: 59 | Attending: Emergency Medicine | Admitting: Emergency Medicine

## 2017-12-13 ENCOUNTER — Emergency Department (HOSPITAL_COMMUNITY): Payer: 59

## 2017-12-13 ENCOUNTER — Encounter (HOSPITAL_COMMUNITY): Payer: Self-pay | Admitting: Emergency Medicine

## 2017-12-13 DIAGNOSIS — M25511 Pain in right shoulder: Secondary | ICD-10-CM | POA: Insufficient documentation

## 2017-12-13 DIAGNOSIS — I509 Heart failure, unspecified: Secondary | ICD-10-CM | POA: Insufficient documentation

## 2017-12-13 DIAGNOSIS — R079 Chest pain, unspecified: Secondary | ICD-10-CM | POA: Insufficient documentation

## 2017-12-13 LAB — COMPREHENSIVE METABOLIC PANEL
ALT: 17 U/L (ref 17–63)
AST: 23 U/L (ref 15–41)
Albumin: 3.7 g/dL (ref 3.5–5.0)
Alkaline Phosphatase: 67 U/L (ref 38–126)
Anion gap: 9 (ref 5–15)
BUN: 8 mg/dL (ref 6–20)
CO2: 26 mmol/L (ref 22–32)
Calcium: 9 mg/dL (ref 8.9–10.3)
Chloride: 104 mmol/L (ref 101–111)
Creatinine, Ser: 1.09 mg/dL (ref 0.61–1.24)
GFR calc Af Amer: >60 mL/min (ref >60)
GFR calc non Af Amer: >60 mL/min (ref >60)
Glucose, Bld: 156 mg/dL — ABNORMAL HIGH (ref 65–99)
Potassium: 3.8 mmol/L (ref 3.5–5.1)
Sodium: 139 mmol/L (ref 135–145)
Total Bilirubin: 1.2 mg/dL (ref 0.3–1.2)
Total Protein: 6.6 g/dL (ref 6.5–8.1)

## 2017-12-13 LAB — CBC
HCT: 42.7 % (ref 39.0–52.0)
Hemoglobin: 14.5 g/dL (ref 13.0–17.0)
MCH: 27.8 pg (ref 26.0–34.0)
MCHC: 34 g/dL (ref 30.0–36.0)
MCV: 81.8 fL (ref 78.0–100.0)
Platelets: 163 10*3/uL (ref 150–400)
RBC: 5.22 MIL/uL (ref 4.22–5.81)
RDW: 12.8 % (ref 11.5–15.5)
WBC: 4.2 10*3/uL (ref 4.0–10.5)

## 2017-12-13 LAB — I-STAT TROPONIN, ED: Troponin i, poc: 0 ng/mL (ref 0.00–0.08)

## 2017-12-13 MED ORDER — CARVEDILOL 6.25 MG PO TABS: 6.2500 mg | ORAL_TABLET | Freq: Two times a day (BID) | ORAL | 2 refills | Status: DC

## 2017-12-13 MED ORDER — LOSARTAN POTASSIUM 25 MG PO TABS: 25.0000 mg | ORAL_TABLET | Freq: Every day | ORAL | 2 refills | Status: DC

## 2017-12-13 MED ORDER — ATORVASTATIN CALCIUM 80 MG PO TABS: 80.0000 mg | ORAL_TABLET | Freq: Every evening | ORAL | 1 refills | Status: DC

## 2017-12-13 MED ORDER — CLOPIDOGREL BISULFATE 75 MG PO TABS: 75.0000 mg | ORAL_TABLET | Freq: Every day | ORAL | 3 refills | Status: DC

## 2017-12-13 NOTE — Discharge Instructions (Addendum)
Return if any problems.  See your Cardiologist for evaluation

## 2017-12-13 NOTE — ED Triage Notes (Signed)
Patient presents to ED for assessment of right shoulder pain, common with his current work, but so bad last night that he became dizzy and was having weakness in his arm as well.  EDP at bedside

## 2017-12-13 NOTE — ED Provider Notes (Signed)
Patient placed in Quick Look pathway, seen and evaluated   Chief Complaint: R shoulder pain  HPI:   Patient is a 52 year old male with significant cardiac history with STEMI, 6 angioplasty with stent, ischemic cardiomyopathy, CHF who presents with a 74-month history of right shoulder pain.  Patient was at work last evening using his right arm repetitively when he began having worsening right shoulder pain.  At the same time, he had associated chest pain that he described as indigestion.  He denies any shortness of breath.  His pain is worse with certain movements.  He denies any numbness or tingling.  He is followed by Dr. Burt Knack with cardiology.  He recently had a February that went well.  ROS: Positive for right shoulder pain, chest pain.  Negative for shortness of breath, abdominal pain, nausea, vomiting, numbness or tingling  Physical Exam:   Gen: No distress  Neuro: Awake and Alert  Skin: Warm    Focused Exam: Tenderness palpation over the right shoulder joint and right upper trapezius, 5/5 strength to bilateral upper extremities, normal sensation, heart normal rate and rhythm, lungs clear to auscultation  Potentially musculoskeletal pain, however considering significant cardiac history with chest pain associated with shoulder pain that occurred last evening, will initiate a cardiac workup as well as x-ray of the right shoulder.   Initiation of care has begun. The patient has been counseled on the process, plan, and necessity for staying for the completion/evaluation, and the remainder of the medical screening examination    Caryl Ada 12/13/17 1356    Dorie Rank, MD 12/14/17 1217

## 2018-03-13 NOTE — Progress Notes (Signed)
Cardiology Office Note:    Date:  03/14/2018   ID:  Randy Moore, DOB Apr 12, 1966, MRN [id number]  PCP:  Patient, No Pcp Per  Cardiologist:  Sherren Mocha, MD   Referring MD: No ref. provider found   Chief Complaint  Patient presents with  . Follow-up    CAD, CHF    History of Present Illness:    Randy Moore is a 52 y.o. male with CAD s/p inferior MI (RV infarct) in 08/2016, ischemic CM, systolic/diastolic CHF.  His myocardial infarction was complicated by hypotension and bradycardia. He underwent successful PCI with DES to the RCA. Residual disease included subtotal occlusion of the LAD, severe diagonal and severe ramus intermediate stenosis. He subsequently underwent staged PCI with DES to the mid LAD and DES to the ramus intermediate.  Attempted balloon angioplasty to the second diagonal was unsuccessful.  Echo in 2/19 demonstrated EF 35-40.  Last seen in clinic by Lyda Jester, PA-C in 08/2017.  Randy Moore returns for cardiology follow-up.  He is doing well.  He denies chest discomfort, shortness of breath, syncope, orthopnea, PND or edema.  He is tolerating all of his medications well.  He denies any bleeding issues.  Prior CV studies:   The following studies were reviewed today:  Echo 09/26/17 EF 35-40, ant-sept, ant, apical AK, Gr 1 DD  Echo 12/23/16 Mild conc LVH, EF 35-40, Gr 1 DD, inf-sept, ant-sept, ant, apical sept and ant AK  Echo 09/23/16 EF 35-40, inf-septal HK, apical septal and apical AK, ant and inf AK, ant-septal AK, Gr 1 diastolic dysfunction   PCI 09/22/16 3 x 38 mm Promus DES to mLAD Attempted POBA to oD2 (80 >> 80) 3 x 28 mm Promus DES to RI  LHC 09/21/16 LAD prox 99, ostial D1 80 RI prox 70 LCx ost 30, mid 70 RCA 100 EF 45-50 PCI: 2.5 x 20 mm Promus DES to pRCA  Past Medical History:  Diagnosis Date  . Acute right ventricular myocardial infarction ([place]) 09/21/2016  . Cardiogenic shock ([place]) 09/21/2016  . Chronic combined systolic and diastolic heart  failure ([place]) 09/21/2016  . Coronary artery disease involving native coronary artery of native heart without angina pectoris 09/21/2016   a - Inf and RV infarct (STEMI) 1/18: LHC - pLAD, oD1 80, pRI 70, oLCx 30, mLCx 70, RCA 100, EF 45-50 >> PCI:  2.5 x 20 mm Promus DES to pRCA // Staged PCI 09/22/16: 3 x 38 mm Promus DES to mLAD; Attempted POBA to oD2 (80 >> 80); 3 x 28 mm Promus DES to RI  . History of ST elevation myocardial infarction (STEMI) 09/21/2016  . Hyperlipidemia 09/29/2016  . Hyperlipidemia 09/29/2016  . Ischemic cardiomyopathy 09/23/2016   a - s/p Inf and RV infarct (STEMI) 1/18 // b - Echo 09/23/16: EF 35-40, inf-septal HK, apical septal and apical AK, ant and inf AK, ant-septal AK, Gr 1 diastolic dysfunction // c -Echo 5/18: mild conc LVH, EF 35-40, Gr 1 DD, inf-septal, ant-septal, ant, apical AK  . Presence of drug coated stent in right coronary artery 09/21/2016  . S/P angioplasty with stent, LAD and Ramus with DES 09/22/16 09/23/2016  . STEMI involving oth coronary artery of inferior wall ([place]) 09/21/2016   Surgical Hx: The patient  has a past surgical history that includes Cardiac catheterization (N/A, 09/21/2016); Cardiac catheterization (N/A, 09/21/2016); and Cardiac catheterization (N/A, 09/22/2016).   Current Medications: Current Meds  Medication Sig  . aspirin EC 81 MG EC tablet Take 1 tablet (81  mg total) by mouth daily.  Marland Kitchen atorvastatin (LIPITOR) 80 MG tablet Take 1 tablet (80 mg total) by mouth every evening.  . carvedilol (COREG) 6.25 MG tablet Take 1 tablet (6.25 mg total) by mouth 2 (two) times daily with a meal.  . clopidogrel (PLAVIX) 75 MG tablet Take 1 tablet (75 mg total) by mouth daily.  Marland Kitchen losartan (COZAAR) 25 MG tablet Take 1 tablet (25 mg total) by mouth daily.  . nitroGLYCERIN (NITROSTAT) 0.4 MG SL tablet PLACE 1 TABLET UNDER TONGUE EVERY 5 MINUTES FOR 3 DOSES AS NEEDED FOR CHEST PAIN  . ranitidine (ZANTAC) 150 MG tablet Take 150 mg by mouth daily as needed for heartburn.    . spironolactone (ALDACTONE) 25 MG tablet Take 0.5 tablets (12.5 mg total) by mouth daily.     Allergies:   Patient has no known allergies.   Social History   Tobacco Use  . Smoking status: Never Smoker  . Smokeless tobacco: Never Used  Substance Use Topics  . Alcohol use: No  . Drug use: No     Family Hx: The patient's family history includes Heart attack in his father; Other in his mother.  ROS:   Please see the history of present illness.    ROS All other systems reviewed and are negative.   EKGs/Labs/Other Test Reviewed:    EKG:  EKG is not ordered today.    Recent Labs: 12/13/2017: ALT 17; BUN 8; Creatinine, Ser 1.09; Hemoglobin 14.5; Platelets 163; Potassium 3.8; Sodium 139   Recent Lipid Panel Lab Results  Component Value Date/Time   CHOL 114 09/19/2017 11:31 AM   TRIG 39 09/19/2017 11:31 AM   HDL 54 09/19/2017 11:31 AM   CHOLHDL 2.1 09/19/2017 11:31 AM   CHOLHDL 3.6 09/21/2016 03:46 AM   LDLCALC 52 09/19/2017 11:31 AM    Physical Exam:    VS:  BP 124/80   Pulse 71   Ht 5\' 8"  (1.727 m)   Wt 170 lb (77.1 kg)   SpO2 99%   BMI 25.85 kg/m     Wt Readings from Last 3 Encounters:  03/14/18 170 lb (77.1 kg)  09/19/17 162 lb 12.8 oz (73.8 kg)  03/27/17 170 lb (77.1 kg)     Physical Exam  Constitutional: He is oriented to person, place, and time. He appears well-developed and well-nourished. No distress.  HENT:  Head: Normocephalic and atraumatic.  Eyes: No scleral icterus.  Neck: No JVD present. Carotid bruit is not present.  Cardiovascular: Normal rate, regular rhythm and normal heart sounds.  No murmur heard. Pulmonary/Chest: Effort normal and breath sounds normal. He has no rales.  Abdominal: Soft. There is no hepatomegaly.  Musculoskeletal: He exhibits no edema.  Neurological: He is alert and oriented to person, place, and time.  Skin: Skin is warm and dry.  Psychiatric: He has a normal mood and affect.    ASSESSMENT & PLAN:    Chronic  combined systolic and diastolic heart failure (HCC) EF 35-40.  NYHA 1.  No evidence of volume excess.  Continue current dose of beta-blocker, ARB, aldosterone antagonist.  Coronary artery disease involving native coronary artery of native heart without angina pectoris History of inferior myocardial infarction in January 2018 treated with drug-eluting stent to the RCA and staged PCI with drug-eluting stent to LAD and drug-eluting stent to the ramus intermediate.  Angioplasty of the second diagonal was unsuccessful.  He remains on aspirin and Plavix.  As he did have multivessel stenting and diffuse disease, I  have recommend he remain on long-term dual antiplatelet therapy.  He seems to be tolerating this well.  Continue atorvastatin 80, carvedilol 6.25 mg twice daily, losartan 25 mg daily, aspirin, Plavix.  Pure hypercholesterolemia LDL optimal on most recent lab work.  Continue current Rx.     Dispo:  Return in about 6 months (around 09/14/2018) for Routine Follow Up, w/ Dr. Burt Knack.   Medication Adjustments/Labs and Tests Ordered: Current medicines are reviewed at length with the patient today.  Concerns regarding medicines are outlined above.  Tests Ordered: No orders of the defined types were placed in this encounter.  Medication Changes: No orders of the defined types were placed in this encounter.   Signed, [name], PA-C  03/14/2018 9:31 AM    [facility] [place], [place], [place]  [address] Phone: [phone]; Fax: [phone]

## 2018-03-14 ENCOUNTER — Encounter (INDEPENDENT_AMBULATORY_CARE_PROVIDER_SITE_OTHER): Payer: Self-pay

## 2018-03-14 ENCOUNTER — Ambulatory Visit (INDEPENDENT_AMBULATORY_CARE_PROVIDER_SITE_OTHER): Payer: 59 | Admitting: Physician Assistant

## 2018-03-14 ENCOUNTER — Encounter: Payer: Self-pay | Admitting: Physician Assistant

## 2018-03-14 VITALS — BP 124/80 | HR 71 | Ht 68.0 in | Wt 170.0 lb

## 2018-03-14 DIAGNOSIS — I2510 Atherosclerotic heart disease of native coronary artery without angina pectoris: Secondary | ICD-10-CM | POA: Diagnosis not present

## 2018-03-14 DIAGNOSIS — I5042 Chronic combined systolic (congestive) and diastolic (congestive) heart failure: Secondary | ICD-10-CM

## 2018-03-14 DIAGNOSIS — E7800 Pure hypercholesterolemia, unspecified: Secondary | ICD-10-CM

## 2018-03-14 NOTE — Patient Instructions (Signed)
Medication Instructions:  1. Your physician recommends that you continue on your current medications as directed. Please refer to the Current Medication list given to you today.   Labwork: NONE ORDERED TODAY  Testing/Procedures: NONE ORDERED TODAY  Follow-Up: DR. Burt Knack IN 6 MONTHS   Any Other Special Instructions Will Be Listed Below (If Applicable).     If you need a refill on your cardiac medications before your next appointment, please call your pharmacy.

## 2018-06-06 ENCOUNTER — Telehealth: Payer: Self-pay

## 2018-06-06 NOTE — Telephone Encounter (Signed)
Walk in form received stating: "patient has concerns about swelling in his leg/foot."

## 2018-06-06 NOTE — Telephone Encounter (Signed)
The patient complains of intermittent mild swelling in both of his legs from the knees down.  He denies any weight gain or SOB and states his legs are currently not swollen. Instructed the patient to limit salt in his diet. He states he has "really gotten in to some salt lately." He understands to elevate his legs when sitting. He will continue to monitor and call if symptoms worsen. 6 month follow-up scheduled with Dr. Burt Knack in February. He was grateful for call and agrees with treatment plan.

## 2018-09-27 ENCOUNTER — Ambulatory Visit (INDEPENDENT_AMBULATORY_CARE_PROVIDER_SITE_OTHER): Payer: Managed Care, Other (non HMO) | Admitting: Cardiovascular Disease

## 2018-09-27 ENCOUNTER — Encounter: Payer: Self-pay | Admitting: Cardiovascular Disease

## 2018-09-27 VITALS — BP 118/78 | HR 68 | Ht 68.0 in | Wt 166.8 lb

## 2018-09-27 DIAGNOSIS — I2510 Atherosclerotic heart disease of native coronary artery without angina pectoris: Secondary | ICD-10-CM | POA: Diagnosis not present

## 2018-09-27 DIAGNOSIS — I255 Ischemic cardiomyopathy: Secondary | ICD-10-CM

## 2018-09-27 DIAGNOSIS — E782 Mixed hyperlipidemia: Secondary | ICD-10-CM | POA: Diagnosis not present

## 2018-09-27 MED ORDER — ATORVASTATIN CALCIUM 80 MG PO TABS: 80.0000 mg | ORAL_TABLET | Freq: Every evening | ORAL | 3 refills | Status: DC

## 2018-09-27 MED ORDER — CARVEDILOL 3.125 MG PO TABS: 3.1250 mg | ORAL_TABLET | Freq: Two times a day (BID) | ORAL | 3 refills | Status: DC

## 2018-09-27 MED ORDER — LOSARTAN POTASSIUM 50 MG PO TABS: 50.0000 mg | ORAL_TABLET | Freq: Every day | ORAL | 3 refills | Status: DC

## 2018-09-27 NOTE — Patient Instructions (Addendum)
Medication Instructions:  1) START ATORVASTATIN 80 mg daily 2) START COREG 3.125 mg twice daily 3) START LOSARTAN 50 mg daily  4) Continue ASPIRIN 81 mg daily  Labwork: Your provider recommends that you return for FASTING lab work on Friday, October 26, 2018. You may come any time between 7:30AM and 4:30PM. Make sure to check-in!  Testing/Procedures: Your provider has requested that you have an echocardiogram on Friday, July 31. Please arrive by 9:00AM. Echocardiography is a painless test that uses sound waves to create images of your heart. It provides your doctor with information about the size and shape of your heart and how well your heart's chambers and valves are working. This procedure takes approximately one hour. There are no restrictions for this procedure.  Follow-Up: You are scheduled for a follow-up appointment on Friday, March 29, 2019 at 8:00AM with Dr. Antionette Char assistant, [name].  Your provider wants you to follow-up in: 1 year with Dr. Burt Knack. You will receive a reminder letter in the mail two months in advance. If you don't receive a letter, please call our office to schedule the follow-up appointment.

## 2018-09-27 NOTE — Progress Notes (Signed)
Cardiology Office Note:    Date:  09/27/2018   ID:  Randy Moore, DOB 1966-05-07, MRN [id number]  PCP:  Patient, No Pcp Per  Cardiologist:  Sherren Mocha, MD  Electrophysiologist:  None   Referring MD: No ref. provider found   Chief Complaint  Patient presents with  . Coronary Artery Disease    History of Present Illness:    Randy Moore is a 53 y.o. male with a hx of coronary artery disease with inferior MI in 3235 complicated by RV infarction.  He has ischemic cardiomyopathy with chronic combined systolic and diastolic heart failure.  At the time of his infarct, he was treated with primary PCI using a drug-eluting stent in the RCA.  He underwent staged intervention of critical stenosis in the LAD during his index hospitalization.  His post infarct LVEF was estimated at 35 to 40%.  He was last seen in July 2019 and was stable at the time with New York Heart Association functional class I symptoms.  The patient is here alone today.  He has felt very well and he has quit taking all of his medicines except for aspirin 81 mg daily.  He plays soccer and basketball without any exertional symptoms.  He specifically denies chest pain, chest pressure, or shortness of breath.  He denies leg swelling, orthopnea, PND, heart palpitations, lightheadedness, or syncope.  Past Medical History:  Diagnosis Date  . Acute right ventricular myocardial infarction ([place]) 09/21/2016  . Cardiogenic shock ([place]) 09/21/2016  . Chronic combined systolic and diastolic heart failure ([place]) 09/21/2016  . Coronary artery disease involving native coronary artery of native heart without angina pectoris 09/21/2016   a - Inf and RV infarct (STEMI) 1/18: LHC - pLAD, oD1 80, pRI 70, oLCx 30, mLCx 70, RCA 100, EF 45-50 >> PCI:  2.5 x 20 mm Promus DES to pRCA // Staged PCI 09/22/16: 3 x 38 mm Promus DES to mLAD; Attempted POBA to oD2 (80 >> 80); 3 x 28 mm Promus DES to RI  . History of ST elevation myocardial infarction (STEMI)  09/21/2016  . Hyperlipidemia 09/29/2016  . Hyperlipidemia 09/29/2016  . Ischemic cardiomyopathy 09/23/2016   a - s/p Inf and RV infarct (STEMI) 1/18 // b - Echo 09/23/16: EF 35-40, inf-septal HK, apical septal and apical AK, ant and inf AK, ant-septal AK, Gr 1 diastolic dysfunction // c -Echo 5/18: mild conc LVH, EF 35-40, Gr 1 DD, inf-septal, ant-septal, ant, apical AK  . Presence of drug coated stent in right coronary artery 09/21/2016  . S/P angioplasty with stent, LAD and Ramus with DES 09/22/16 09/23/2016  . STEMI involving oth coronary artery of inferior wall ([place]) 09/21/2016    Past Surgical History:  Procedure Laterality Date  . CARDIAC CATHETERIZATION N/A 09/21/2016   Procedure: Left Heart Cath and Coronary Angiography;  Surgeon: Sherren Mocha, MD;  Location: [facility];  Service: Cardiovascular;  Laterality: N/A;  . CARDIAC CATHETERIZATION N/A 09/21/2016   Procedure: Coronary Stent Intervention;  Surgeon: Sherren Mocha, MD;  Location: [facility];  Service: Cardiovascular;  Laterality: N/A;  Prox RCA  . CARDIAC CATHETERIZATION N/A 09/22/2016   Procedure: Coronary CTO Intervention;  Surgeon: Burnell Blanks, MD;  Location: [facility];  Service: Cardiovascular;  Laterality: N/A;    Current Medications: Current Meds  Medication Sig  . aspirin EC 81 MG EC tablet Take 1 tablet (81 mg total) by mouth daily.  Marland Kitchen atorvastatin (LIPITOR) 80 MG tablet Take 1 tablet (  80 mg total) by mouth every evening.  . carvedilol (COREG) 3.125 MG tablet Take 1 tablet (3.125 mg total) by mouth 2 (two) times daily with a meal.  . ibuprofen (ADVIL,MOTRIN) 800 MG tablet Take 1 tablet by mouth as needed.  Marland Kitchen losartan (COZAAR) 50 MG tablet Take 1 tablet (50 mg total) by mouth daily.  . nitroGLYCERIN (NITROSTAT) 0.4 MG SL tablet PLACE 1 TABLET UNDER TONGUE EVERY 5 MINUTES FOR 3 DOSES AS NEEDED FOR CHEST PAIN  . ranitidine (ZANTAC) 150 MG tablet Take 150 mg by mouth daily as needed for heartburn.   . [DISCONTINUED] atorvastatin (LIPITOR) 80 MG tablet Take 1 tablet (80 mg total) by mouth every evening.  . [DISCONTINUED] carvedilol (COREG) 6.25 MG tablet Take 1 tablet (6.25 mg total) by mouth 2 (two) times daily with a meal.  . [DISCONTINUED] clopidogrel (PLAVIX) 75 MG tablet Take 1 tablet (75 mg total) by mouth daily.  . [DISCONTINUED] losartan (COZAAR) 25 MG tablet Take 1 tablet (25 mg total) by mouth daily.  . [DISCONTINUED] spironolactone (ALDACTONE) 25 MG tablet Take 0.5 tablets (12.5 mg total) by mouth daily.     Allergies:   Patient has no known allergies.   Social History   Socioeconomic History  . Marital status: Married    Spouse name: Not on file  . Number of children: Not on file  . Years of education: Not on file  . Highest education level: Not on file  Occupational History  . Not on file  Social Needs  . Financial resource strain: Not on file  . Food insecurity:    Worry: Not on file    Inability: Not on file  . Transportation needs:    Medical: Not on file    Non-medical: Not on file  Tobacco Use  . Smoking status: Never Smoker  . Smokeless tobacco: Never Used  Substance and Sexual Activity  . Alcohol use: No  . Drug use: No  . Sexual activity: Not on file  Lifestyle  . Physical activity:    Days per week: Not on file    Minutes per session: Not on file  . Stress: Not on file  Relationships  . Social connections:    Talks on phone: Not on file    Gets together: Not on file    Attends religious service: Not on file    Active member of club or organization: Not on file    Attends meetings of clubs or organizations: Not on file    Relationship status: Not on file  Other Topics Concern  . Not on file  Social History Narrative  . Not on file     Family History: The patient's family history includes Heart attack in his father; Other in his mother.  ROS:   Please see the history of present illness.    All other systems reviewed and are  negative.  EKGs/Labs/Other Studies Reviewed:    The following studies were reviewed today: 2D echocardiogram 09/26/2017: Study Conclusions  - Left ventricle: The cavity size was normal. Systolic function was   moderately reduced. The estimated ejection fraction was in the   range of 35% to 40%. Akinesis and scarring of the   mid-apicalanteroseptal, anterior, and apical myocardium;   consistent with infarction in the distribution of the left   anterior descending coronary artery. Doppler parameters are   consistent with abnormal left ventricular relaxation (grade 1   diastolic dysfunction). No evidence of thrombus.  EKG:  EKG is ordered  today.  The ekg ordered today demonstrates normal sinus rhythm 68 bpm, ST and T wave abnormality consider anterolateral ischemia.  Recent Labs: 12/13/2017: ALT 17; BUN 8; Creatinine, Ser 1.09; Hemoglobin 14.5; Platelets 163; Potassium 3.8; Sodium 139  Recent Lipid Panel    Component Value Date/Time   CHOL 114 09/19/2017 1131   TRIG 39 09/19/2017 1131   HDL 54 09/19/2017 1131   CHOLHDL 2.1 09/19/2017 1131   CHOLHDL 3.6 09/21/2016 0346   VLDL 6 09/21/2016 0346   LDLCALC 52 09/19/2017 1131    Physical Exam:    VS:  BP 118/78   Pulse 68   Ht 5\' 8"  (1.727 m)   Wt 166 lb 12.8 oz (75.7 kg)   SpO2 97%   BMI 25.36 kg/m     Wt Readings from Last 3 Encounters:  09/27/18 166 lb 12.8 oz (75.7 kg)  03/14/18 170 lb (77.1 kg)  09/19/17 162 lb 12.8 oz (73.8 kg)     GEN:  Well nourished, well developed in no acute distress HEENT: Normal NECK: No JVD; No carotid bruits LYMPHATICS: No lymphadenopathy CARDIAC: RRR, no murmurs, rubs, gallops RESPIRATORY:  Clear to auscultation without rales, wheezing or rhonchi  ABDOMEN: Soft, non-tender, non-distended MUSCULOSKELETAL:  No edema; No deformity  SKIN: Warm and dry NEUROLOGIC:  Alert and oriented x 3 PSYCHIATRIC:  Normal affect   ASSESSMENT:    1. Ischemic cardiomyopathy   2. Coronary artery  disease involving native coronary artery of native heart without angina pectoris   3. Mixed hyperlipidemia    PLAN:    In order of problems listed above:  1. The patient is clinically stable with New York Heart Association functional class I symptoms.  We reviewed the rationale for medical therapy in the context of his significant LV systolic dysfunction.  He is willing to go back on a few medications.  I recommended that he start back on carvedilol 3.125 mg twice daily and losartan 50 mg daily.  He will follow-up in 6 months.  I would like him to have a repeat echo study when he returns for follow-up. 2. The patient is having no anginal symptoms.  He is agreeable to continue on aspirin and start back on atorvastatin 80 mg daily. 3. The patient will start back on atorvastatin as above, will check lipids and LFTs as well as a metabolic panel and 1 month since he is starting back on multiple medications.   Medication Adjustments/Labs and Tests Ordered: Current medicines are reviewed at length with the patient today.  Concerns regarding medicines are outlined above.  Orders Placed This Encounter  Procedures  . Comprehensive metabolic panel  . Lipid panel  . EKG 12-Lead  . ECHOCARDIOGRAM COMPLETE   Meds ordered this encounter  Medications  . carvedilol (COREG) 3.125 MG tablet    Sig: Take 1 tablet (3.125 mg total) by mouth 2 (two) times daily with a meal.    Dispense:  180 tablet    Refill:  3  . losartan (COZAAR) 50 MG tablet    Sig: Take 1 tablet (50 mg total) by mouth daily.    Dispense:  90 tablet    Refill:  3  . atorvastatin (LIPITOR) 80 MG tablet    Sig: Take 1 tablet (80 mg total) by mouth every evening.    Dispense:  90 tablet    Refill:  3    Patient Instructions  Medication Instructions:  1) START ATORVASTATIN 80 mg daily 2) START COREG 3.125 mg twice  daily 3) START LOSARTAN 50 mg daily  4) Continue ASPIRIN 81 mg daily  Labwork: Your provider recommends that you  return for FASTING lab work on Friday, October 26, 2018. You may come any time between 7:30AM and 4:30PM. Make sure to check-in!  Testing/Procedures: Your provider has requested that you have an echocardiogram on Friday, July 31. Please arrive by 9:00AM. Echocardiography is a painless test that uses sound waves to create images of your heart. It provides your doctor with information about the size and shape of your heart and how well your heart's chambers and valves are working. This procedure takes approximately one hour. There are no restrictions for this procedure.  Follow-Up: You are scheduled for a follow-up appointment on Friday, March 29, 2019 at 8:00AM with Dr. Antionette Char assistant, [name].  Your provider wants you to follow-up in: 1 year with Dr. Burt Knack. You will receive a reminder letter in the mail two months in advance. If you don't receive a letter, please call our office to schedule the follow-up appointment.      Signed, Sherren Mocha, MD  09/27/2018 1:23 PM    [facility]

## 2018-10-26 ENCOUNTER — Other Ambulatory Visit: Payer: Managed Care, Other (non HMO) | Admitting: *Deleted

## 2018-10-26 DIAGNOSIS — E782 Mixed hyperlipidemia: Secondary | ICD-10-CM

## 2018-10-26 DIAGNOSIS — I2510 Atherosclerotic heart disease of native coronary artery without angina pectoris: Secondary | ICD-10-CM

## 2018-10-26 DIAGNOSIS — I255 Ischemic cardiomyopathy: Secondary | ICD-10-CM

## 2018-10-27 LAB — COMPREHENSIVE METABOLIC PANEL
ALT: 13 IU/L (ref 0–44)
AST: 20 IU/L (ref 0–40)
Albumin/Globulin Ratio: 2 (ref 1.2–2.2)
Albumin: 4.3 g/dL (ref 3.8–4.9)
Alkaline Phosphatase: 71 IU/L (ref 39–117)
BUN/Creatinine Ratio: 7 — ABNORMAL LOW (ref 9–20)
BUN: 7 mg/dL (ref 6–24)
Bilirubin Total: 0.7 mg/dL (ref 0.0–1.2)
CO2: 25 mmol/L (ref 20–29)
Calcium: 8.9 mg/dL (ref 8.7–10.2)
Chloride: 103 mmol/L (ref 96–106)
Creatinine, Ser: 1.03 mg/dL (ref 0.76–1.27)
GFR calc Af Amer: 95 mL/min/{1.73_m2} (ref >59)
GFR calc non Af Amer: 82 mL/min/{1.73_m2} (ref >59)
Globulin, Total: 2.1 g/dL (ref 1.5–4.5)
Glucose: 89 mg/dL (ref 65–99)
Potassium: 3.7 mmol/L (ref 3.5–5.2)
Sodium: 143 mmol/L (ref 134–144)
Total Protein: 6.4 g/dL (ref 6.0–8.5)

## 2018-10-27 LAB — LIPID PANEL
Chol/HDL Ratio: 3 ratio (ref 0.0–5.0)
Cholesterol, Total: 185 mg/dL (ref 100–199)
HDL: 61 mg/dL (ref >39)
LDL Calculated: 109 mg/dL — ABNORMAL HIGH (ref 0–99)
Triglycerides: 74 mg/dL (ref 0–149)
VLDL Cholesterol Cal: 15 mg/dL (ref 5–40)

## 2018-11-02 ENCOUNTER — Telehealth: Payer: Self-pay | Admitting: Cardiovascular Disease

## 2018-11-02 DIAGNOSIS — E7800 Pure hypercholesterolemia, unspecified: Secondary | ICD-10-CM

## 2018-11-02 DIAGNOSIS — Z79899 Other long term (current) drug therapy: Secondary | ICD-10-CM

## 2018-11-02 NOTE — Telephone Encounter (Signed)
Notes recorded by Sherren Mocha, MD on 11/01/2018 at 3:53 PM EDT Labs look fine except LDL above goal. Reviewed notes and looks like he has only been on atorvastatin for one month. Please confirm that he is taking and repeat lipids and lft's in 6 months. Thanks  Called patient back with lab results above. Patient stated he has not been taking his Atorvastatin. Patient stated he would start taking his medication and will come in 6 months to repeat his labs.

## 2018-11-02 NOTE — Telephone Encounter (Signed)
Patient is calling for lab results.

## 2018-12-31 ENCOUNTER — Other Ambulatory Visit: Payer: Self-pay | Admitting: Cardiovascular Disease

## 2018-12-31 ENCOUNTER — Other Ambulatory Visit: Payer: Self-pay | Admitting: Cardiology

## 2019-01-01 NOTE — Telephone Encounter (Signed)
I have pt on losartan 50 mg daily,  # 30 and 6 refills.  Unsure why this says 25 mg.

## 2019-01-03 NOTE — Telephone Encounter (Signed)
Randy Moore pt is supposed to be on 50 mg tabs can you check with him? thanks

## 2019-01-24 ENCOUNTER — Encounter: Payer: Self-pay | Admitting: *Deleted

## 2019-01-24 ENCOUNTER — Telehealth: Payer: Self-pay | Admitting: Cardiovascular Disease

## 2019-01-24 ENCOUNTER — Ambulatory Visit (INDEPENDENT_AMBULATORY_CARE_PROVIDER_SITE_OTHER): Payer: Managed Care, Other (non HMO) | Admitting: Cardiology

## 2019-01-24 ENCOUNTER — Encounter: Payer: Self-pay | Admitting: Cardiology

## 2019-01-24 VITALS — BP 130/82 | Ht 68.0 in | Wt 179.0 lb

## 2019-01-24 DIAGNOSIS — K219 Gastro-esophageal reflux disease without esophagitis: Secondary | ICD-10-CM | POA: Diagnosis not present

## 2019-01-24 DIAGNOSIS — I255 Ischemic cardiomyopathy: Secondary | ICD-10-CM

## 2019-01-24 DIAGNOSIS — I2510 Atherosclerotic heart disease of native coronary artery without angina pectoris: Secondary | ICD-10-CM

## 2019-01-24 DIAGNOSIS — R079 Chest pain, unspecified: Secondary | ICD-10-CM

## 2019-01-24 DIAGNOSIS — R0789 Other chest pain: Secondary | ICD-10-CM | POA: Diagnosis not present

## 2019-01-24 MED ORDER — FAMOTIDINE 20 MG PO TABS: 20.0000 mg | ORAL_TABLET | Freq: Two times a day (BID) | ORAL | 11 refills | Status: DC

## 2019-01-24 NOTE — Telephone Encounter (Signed)
New message   Patient in lobby  Pt c/o of Chest Pain: STAT if CP now or developed within 24 hours  1. Are you having CP right now? no  2. Are you experiencing any other symptoms (ex. SOB, nausea, vomiting, sweating)? no  3. How long have you been experiencing CP? A couple weeks   4. Is your CP continuous or coming and going? Come and go   5. Have you taken Nitroglycerin? No  ?

## 2019-01-24 NOTE — Progress Notes (Signed)
Cardiology Office Note   Date:  01/24/2019   ID:  Randy Moore, DOB 1966-03-26, MRN [id number]  PCP:  Patient, No Pcp Per  Cardiologist:  Dr. Burt Knack    Chief Complaint  Patient presents with  . Chest Pain  . Shortness of Breath      History of Present Illness: Randy Moore is a 53 y.o. male who presents for episodic chest pain.   He has a hx of coronary artery disease with inferior MI in 8144 complicated by RV infarction.  He has ischemic cardiomyopathy with chronic combined systolic and diastolic heart failure.  At the time of his infarct, he was treated with primary PCI using a drug-eluting stent in the RCA.  He underwent staged intervention of critical stenosis in the LAD during his index hospitalization.  His post infarct LVEF was estimated at 35 to 40%.  He was last seen in July 2019 and was stable at the time with New York Heart Association functional class I symptoms.  On his last visit he had stopped meds except aspirin 81 mg daily atorvastatin was added along with coreg and losartan.  .     Today he was a walk in with recent episodes of chest pain and SOB.  Has been going on for some time.  Lt lateral discomfort and in addition some mid sternal chest burning.  He is also concerned about taking zantac.  No SOB nausea, or diaphoresis.  Looking back to admit in 2018 with STEMI he also had chest burning the increased to chest pain.   He does continue to take his meds just prescribed in March.  No swelling.  No colds or fevers. No cough.   Past Medical History:  Diagnosis Date  . Acute right ventricular myocardial infarction ([place]) 09/21/2016  . Cardiogenic shock ([place]) 09/21/2016  . Chronic combined systolic and diastolic heart failure ([place]) 09/21/2016  . Coronary artery disease involving native coronary artery of native heart without angina pectoris 09/21/2016   a - Inf and RV infarct (STEMI) 1/18: LHC - pLAD, oD1 80, pRI 70, oLCx 30, mLCx 70, RCA 100, EF 45-50 >> PCI:  2.5 x 20 mm  Promus DES to pRCA // Staged PCI 09/22/16: 3 x 38 mm Promus DES to mLAD; Attempted POBA to oD2 (80 >> 80); 3 x 28 mm Promus DES to RI  . History of ST elevation myocardial infarction (STEMI) 09/21/2016  . Hyperlipidemia 09/29/2016  . Hyperlipidemia 09/29/2016  . Ischemic cardiomyopathy 09/23/2016   a - s/p Inf and RV infarct (STEMI) 1/18 // b - Echo 09/23/16: EF 35-40, inf-septal HK, apical septal and apical AK, ant and inf AK, ant-septal AK, Gr 1 diastolic dysfunction // c -Echo 5/18: mild conc LVH, EF 35-40, Gr 1 DD, inf-septal, ant-septal, ant, apical AK  . Presence of drug coated stent in right coronary artery 09/21/2016  . S/P angioplasty with stent, LAD and Ramus with DES 09/22/16 09/23/2016  . STEMI involving oth coronary artery of inferior wall ([place]) 09/21/2016    Past Surgical History:  Procedure Laterality Date  . CARDIAC CATHETERIZATION N/A 09/21/2016   Procedure: Left Heart Cath and Coronary Angiography;  Surgeon: Sherren Mocha, MD;  Location: [facility];  Service: Cardiovascular;  Laterality: N/A;  . CARDIAC CATHETERIZATION N/A 09/21/2016   Procedure: Coronary Stent Intervention;  Surgeon: Sherren Mocha, MD;  Location: [facility];  Service: Cardiovascular;  Laterality: N/A;  Prox RCA  . CARDIAC CATHETERIZATION N/A 09/22/2016   Procedure: Coronary  CTO Intervention;  Surgeon: Burnell Blanks, MD;  Location: [facility];  Service: Cardiovascular;  Laterality: N/A;     Current Outpatient Medications  Medication Sig Dispense Refill  . aspirin EC 81 MG EC tablet Take 1 tablet (81 mg total) by mouth daily.    Marland Kitchen atorvastatin (LIPITOR) 80 MG tablet Take 1 tablet (80 mg total) by mouth every evening. 90 tablet 3  . carvedilol (COREG) 3.125 MG tablet Take 1 tablet (3.125 mg total) by mouth 2 (two) times daily with a meal. 180 tablet 3  . losartan (COZAAR) 50 MG tablet Take 1 tablet (50 mg total) by mouth daily. 90 tablet 3  . nitroGLYCERIN (NITROSTAT) 0.4 MG SL tablet  PLACE 1 TABLET UNDER TONGUE EVERY 5 MINUTES FOR 3 DOSES AS NEEDED FOR CHEST PAIN 25 tablet 4   No current facility-administered medications for this visit.     Allergies:   Patient has no known allergies.    Social History:  The patient  reports that he has never smoked. He has never used smokeless tobacco. He reports that he does not drink alcohol or use drugs.   Family History:  The patient's family history includes Heart attack in his father; Other in his mother.    ROS:  General:no colds or fevers, no weight changes Skin:no rashes or ulcers HEENT:no blurred vision, no congestion CV:see HPI PUL:see HPI GI:no diarrhea constipation or melena, no indigestion GU:no hematuria, no dysuria MS:no joint pain, no claudication Neuro:no syncope, no lightheadedness Endo:no diabetes, no thyroid disease  Wt Readings from Last 3 Encounters:  09/27/18 166 lb 12.8 oz (75.7 kg)  03/14/18 170 lb (77.1 kg)  09/19/17 162 lb 12.8 oz (73.8 kg)     PHYSICAL EXAM: VS:  There were no vitals taken for this visit. , BMI There is no height or weight on file to calculate BMI. General:Pleasant affect, NAD Skin:Warm and dry, brisk capillary refill HEENT:normocephalic, sclera clear, mucus membranes moist Neck:supple, no JVD, no bruits  Heart:S1S2 RRR without murmur, gallup, rub or click Lungs:clear without rales, rhonchi, or wheezes YIR:SWNI, non tender, + BS, do not palpate liver spleen or masses Ext:no lower ext edema, 2+ pedal pulses, 2+ radial pulses, no pain to plapation to Lt side or mid sternal.  Neuro:alert and oriented X 3, MAE, follows commands, + facial symmetry    EKG:  EKG is ordered today. The ekg ordered today demonstrates SR with T wave abnormality consider ant ischemia but no changes from March 2020.    Recent Labs: 10/26/2018: ALT 13; BUN 7; Creatinine, Ser 1.03; Potassium 3.7; Sodium 143    Lipid Panel    Component Value Date/Time   CHOL 185 10/26/2018 1521   TRIG 74  10/26/2018 1521   HDL 61 10/26/2018 1521   CHOLHDL 3.0 10/26/2018 1521   CHOLHDL 3.6 09/21/2016 0346   VLDL 6 09/21/2016 0346   LDLCALC 109 (H) 10/26/2018 1521       Other studies Reviewed: Additional studies/ records that were reviewed today include:  . 2D echocardiogram 09/26/2017: Study Conclusions  - Left ventricle: The cavity size was normal. Systolic function was moderately reduced. The estimated ejection fraction was in the range of 35% to 40%. Akinesis and scarring of the mid-apicalanteroseptal, anterior, and apical myocardium; consistent with infarction in the distribution of the left anterior descending coronary artery. Doppler parameters are consistent with abnormal left ventricular relaxation (grade 1 diastolic dysfunction). No evidence of thrombus.  Cardiac cath 09/21/16 1. Acute inferior wall  and RV myocardial infarction complicated by bradycardia and hypotension, treated successfully with primary PCI using a drug-eluting stent 2. Severe multivessel coronary artery disease with subtotal occlusion of the LAD, severe stenosis of the first diagonal, and severe stenosis of the ramus intermedius branch 3. Moderate segmental LV systolic dysfunction with severe elevation of LVEDP  Recommendations: The patient will be loaded with brilinta. Will continue Aggrastat for 6 hours. He may require diuresis considering his elevated LVEDP. Will need to decide on further revascularization. Burtis Junes an attempted staged PCI of the LAD with aggressive medical therapy. If LAD PCI is unsuccessful would consider CABG.  Intervention     Intervention  Post CTO intervention      ASSESSMENT AND PLAN:  1.  Chest pain Lt lateral wall, and chest burning.  Atypical but stents are 55 years old and will do lexiscan myoview. 2.  CAD with prior stents to RCA, LAD and intermediate vessel.  3.  Ischemic CM with last EF  09/2017 of 35-40% Akinesis and scarring of the    mid-apicalanteroseptal, anterior, and apical myocardium;   consistent with infarction in the distribution of the left anterior descending coronary artery G1 DD. 4.  GERD, stop zantac and take pepcid.   Follow up with Dr. Burt Knack in 6 months as instructed.  If test is positive will review with Dr. Burt Knack, scar should be present but looking for ischemia.       Current medicines are reviewed with the patient today.  The patient Has no concerns regarding medicines.  The following changes have been made:  See above Labs/ tests ordered today include:see above  Disposition:   FU:  see above  Signed, Cecilie Kicks, NP  01/24/2019 10:40 AM    [address], [place], [place], [place] Phone: [phone]; Fax: [phone]

## 2019-01-24 NOTE — Telephone Encounter (Signed)
Pt was added onto the [facility] Schedule 01/24/2019 at 10:15.

## 2019-01-24 NOTE — Patient Instructions (Addendum)
Medication Instructions:  Your physician has recommended you make the following change in your medication:  1.  START Pepcid 20 mg taking 1 tablet twice a day 2.  STOP Zantac   If you need a refill on your cardiac medications before your next appointment, please call your pharmacy.   Lab work: None ordered  If you have labs (blood work) drawn today and your tests are completely normal, you will receive your results only by: Marland Kitchen MyChart Message (if you have MyChart) OR . A paper copy in the mail If you have any lab test that is abnormal or we need to change your treatment, we will call you to review the results.  Testing/Procedures: Your physician has requested that you have a lexiscan myoview WITHIN 1 WEEK. For further information please visit [email or web address]. Please follow instruction sheet, as given.    Follow-Up: At [facility], you and your health needs are our priority.  As part of our continuing mission to provide you with exceptional heart care, we have created designated Provider Care Teams.  These Care Teams include your primary Cardiologist (physician) and Advanced Practice Providers (APPs -  Physician Assistants and Nurse Practitioners) who all work together to provide you with the care you need, when you need it. . DR. Burt Knack IN 1 YEAR.  WE WILL SEND YOU A LETTER 2 MONTHS PRIOR TO THE TIME FOR YOU TO CALL AND GET THAT SCHEDULED.  Any Other Special Instructions Will Be Listed Below (If Applicable).  Cardiac Nuclear Scan A cardiac nuclear scan is a test that measures blood flow to the heart when a person is resting and when he or she is exercising. The test looks for problems such as:  Not enough blood reaching a portion of the heart.  The heart muscle not working normally. You may need this test if:  You have heart disease.  You have had abnormal lab results.  You have had heart surgery or a balloon procedure to open up blocked arteries (angioplasty).  You  have chest pain.  You have shortness of breath. In this test, a radioactive dye (tracer) is injected into your bloodstream. After the tracer has traveled to your heart, an imaging device is used to measure how much of the tracer is absorbed by or distributed to various areas of your heart. This procedure is usually done at a hospital and takes 2-4 hours. Tell a health care provider about:  Any allergies you have.  All medicines you are taking, including vitamins, herbs, eye drops, creams, and over-the-counter medicines.  Any problems you or family members have had with anesthetic medicines.  Any blood disorders you have.  Any surgeries you have had.  Any medical conditions you have.  Whether you are pregnant or may be pregnant. What are the risks? Generally, this is a safe procedure. However, problems may occur, including:  Serious chest pain and heart attack. This is only a risk if the stress portion of the test is done.  Rapid heartbeat.  Sensation of warmth in your chest. This usually passes quickly.  Allergic reaction to the tracer. What happens before the procedure?  Ask your health care provider about changing or stopping your regular medicines. This is especially important if you are taking diabetes medicines or blood thinners.  Follow instructions from your health care provider about eating or drinking restrictions.  Remove your jewelry on the day of the procedure. What happens during the procedure?  An IV will be inserted into one of  your veins.  Your health care provider will inject a small amount of radioactive tracer through the IV.  You will wait for 20-40 minutes while the tracer travels through your bloodstream.  Your heart activity will be monitored with an electrocardiogram (ECG).  You will lie down on an exam table.  Images of your heart will be taken for about 15-20 minutes.  You may also have a stress test. For this test, one of the following may  be done: ? You will exercise on a treadmill or stationary bike. While you exercise, your heart's activity will be monitored with an ECG, and your blood pressure will be checked. ? You will be given medicines that will increase blood flow to parts of your heart. This is done if you are unable to exercise.  When blood flow to your heart has peaked, a tracer will again be injected through the IV.  After 20-40 minutes, you will get back on the exam table and have more images taken of your heart.  Depending on the type of tracer used, scans may need to be repeated 3-4 hours later.  Your IV line will be removed when the procedure is over. The procedure may vary among health care providers and hospitals. What happens after the procedure?  Unless your health care provider tells you otherwise, you may return to your normal schedule, including diet, activities, and medicines.  Unless your health care provider tells you otherwise, you may increase your fluid intake. This will help to flush the contrast dye from your body. Drink enough fluid to keep your urine pale yellow.  Ask your health care provider, or the department that is doing the test: ? When will my results be ready? ? How will I get my results? Summary  A cardiac nuclear scan measures the blood flow to the heart when a person is resting and when he or she is exercising.  Tell your health care provider if you are pregnant.  Before the procedure, ask your health care provider about changing or stopping your regular medicines. This is especially important if you are taking diabetes medicines or blood thinners.  After the procedure, unless your health care provider tells you otherwise, increase your fluid intake. This will help flush the contrast dye from your body.  After the procedure, unless your health care provider tells you otherwise, you may return to your normal schedule, including diet, activities, and medicines. This information  is not intended to replace advice given to you by your health care provider. Make sure you discuss any questions you have with your health care provider. Document Released: 09/02/2004 Document Revised: 01/22/2018 Document Reviewed: 01/22/2018 Elsevier Interactive Patient Education  2019 [employer].

## 2019-01-30 ENCOUNTER — Telehealth (HOSPITAL_COMMUNITY): Payer: Self-pay | Admitting: *Deleted

## 2019-01-30 NOTE — Telephone Encounter (Signed)
Patient given detailed instructions per Myocardial Perfusion Study Information Sheet for the test on 02/01/19 at 7:45. Patient notified to arrive 15 minutes early and that it is imperative to arrive on time for appointment to keep from having the test rescheduled.  If you need to cancel or reschedule your appointment, please call the office within 24 hours of your appointment. . Patient verbalized understanding.Randy Moore

## 2019-02-01 ENCOUNTER — Other Ambulatory Visit: Payer: Self-pay

## 2019-02-01 ENCOUNTER — Ambulatory Visit (HOSPITAL_COMMUNITY): Payer: Managed Care, Other (non HMO) | Attending: Cardiology

## 2019-02-01 DIAGNOSIS — R0789 Other chest pain: Secondary | ICD-10-CM | POA: Insufficient documentation

## 2019-02-01 DIAGNOSIS — R079 Chest pain, unspecified: Secondary | ICD-10-CM

## 2019-02-01 LAB — MYOCARDIAL PERFUSION IMAGING
LV dias vol: 195 mL (ref 62–150)
LV sys vol: 144 mL
Peak HR: 94 {beats}/min
Rest HR: 64 {beats}/min
SDS: 1
SRS: 12
SSS: 18
TID: 1.03

## 2019-02-01 MED ORDER — TECHNETIUM TC 99M TETROFOSMIN IV KIT
10.2000 | PACK | Freq: Once | INTRAVENOUS | Status: AC | PRN
  Administered 2019-02-01: 10.2 via INTRAVENOUS
  Filled 2019-02-01: qty 11

## 2019-02-01 MED ORDER — REGADENOSON 0.4 MG/5ML IV SOLN
0.4000 mg | Freq: Once | INTRAVENOUS | Status: AC
  Administered 2019-02-01: 0.4 mg via INTRAVENOUS

## 2019-02-01 MED ORDER — TECHNETIUM TC 99M TETROFOSMIN IV KIT
31.4000 | PACK | Freq: Once | INTRAVENOUS | Status: AC | PRN
  Administered 2019-02-01: 31.4 via INTRAVENOUS
  Filled 2019-02-01: qty 32

## 2019-02-08 ENCOUNTER — Encounter: Payer: Self-pay | Admitting: Cardiovascular Disease

## 2019-02-08 NOTE — Telephone Encounter (Signed)
Left detailed message, ok per DPR.  Advised results have been sent to Dr. Burt Knack to review and we are waiting to hear back.  Advised once we hear back from Dr. Burt Knack, someone will give him a call with results.

## 2019-02-08 NOTE — Telephone Encounter (Signed)
New Message    Patient calling for stress test results. 

## 2019-02-11 NOTE — Telephone Encounter (Signed)
Follow up    Pt is returning call to follow up He is awaiting for results    Please call back

## 2019-02-11 NOTE — Telephone Encounter (Signed)
This encounter was created in error - please disregard.

## 2019-02-15 ENCOUNTER — Telehealth (HOSPITAL_COMMUNITY): Payer: Self-pay | Admitting: *Deleted

## 2019-02-15 NOTE — Telephone Encounter (Signed)
COVID-19 Pre-Screening Questions:  . Do you currently have a fever? NO (yes = cancel and refer to pcp for e-visit) . Have you recently travelled on a cruise, internationally, or to [place], [place], [place], [place], [place], or [place], [place] [employer]) ? NO (yes = cancel, stay home, monitor symptoms, and contact pcp or initiate e-visit if symptoms develop) . Have you been in contact with someone that is currently pending confirmation of Covid19 testing or has been confirmed to have the [place] virus?   (yes = cancel, stay home, away from tested individual, monitor symptoms, and contact pcp or initiate e-visit if symptoms develop) . Are you currently experiencing fatigue or cough? NO (yes = pt should be prepared to have a mask placed at the time of their visit).   . Reiterated no additional visitors. Randy Moore no earlier than 15 minutes before appointment time. . Please bring own mask.  Randy Moore

## 2019-02-18 ENCOUNTER — Other Ambulatory Visit: Payer: Self-pay

## 2019-02-18 ENCOUNTER — Ambulatory Visit (HOSPITAL_COMMUNITY): Payer: Managed Care, Other (non HMO) | Attending: Cardiology

## 2019-02-18 DIAGNOSIS — I255 Ischemic cardiomyopathy: Secondary | ICD-10-CM | POA: Diagnosis not present

## 2019-02-18 DIAGNOSIS — I2510 Atherosclerotic heart disease of native coronary artery without angina pectoris: Secondary | ICD-10-CM | POA: Insufficient documentation

## 2019-02-27 ENCOUNTER — Telehealth: Payer: Self-pay

## 2019-02-27 MED ORDER — CARVEDILOL 6.25 MG PO TABS: 6.2500 mg | ORAL_TABLET | Freq: Two times a day (BID) | ORAL | 3 refills | Status: DC

## 2019-02-27 NOTE — Telephone Encounter (Signed)
-----   Message from Sherren Mocha, MD sent at 02/26/2019  5:02 PM EDT ----- Study reviewed and LVEF in the same range as that demonstrated with the nuclear scan.  The patient has had known LV dysfunction with last LVEF 35 to 40% previously.  It sounds like he is completely asymptomatic.  He needs aggressive medical therapy to treat his cardiomyopathy.  It looks like he is currently just on carvedilol 3.125 mg twice daily and losartan 50 mg daily.  I would increase his carvedilol to 6.25 mg and within a few weeks would switch him from losartan to Entresto.  Please arrange APP follow-up for heart failure medicine titration.

## 2019-02-27 NOTE — Telephone Encounter (Signed)
Reviewed results with patient who verbalized understanding.   Instructed the patient to INCREASE COREG to 6.25 mg BID.  He will keep his appointment August 7 with [name] for further medication titration. He was grateful for assistance.

## 2019-03-22 ENCOUNTER — Other Ambulatory Visit (HOSPITAL_COMMUNITY): Payer: Managed Care, Other (non HMO)

## 2019-03-27 ENCOUNTER — Telehealth: Payer: Self-pay

## 2019-03-27 NOTE — Telephone Encounter (Signed)
No answer when called to get verbal consent for virtual office visit and review medications.

## 2019-03-28 ENCOUNTER — Telehealth: Payer: Self-pay | Admitting: *Deleted

## 2019-03-28 MED ORDER — LOSARTAN POTASSIUM 50 MG PO TABS: 50.0000 mg | ORAL_TABLET | Freq: Every day | ORAL | 3 refills | Status: DC

## 2019-03-28 MED ORDER — CARVEDILOL 6.25 MG PO TABS: 6.2500 mg | ORAL_TABLET | Freq: Two times a day (BID) | ORAL | 3 refills | Status: DC

## 2019-03-28 MED ORDER — ATORVASTATIN CALCIUM 80 MG PO TABS: 80.0000 mg | ORAL_TABLET | Freq: Every evening | ORAL | 3 refills | Status: DC

## 2019-03-28 NOTE — Progress Notes (Signed)
Virtual Visit via Video Note   This visit type was conducted due to national recommendations for restrictions regarding the COVID-19 Pandemic (e.g. social distancing) in an effort to limit this patient's exposure and mitigate transmission in our community.  Due to his co-morbid illnesses, this patient is at least at moderate risk for complications without adequate follow up.  This format is felt to be most appropriate for this patient at this time.  All issues noted in this document were discussed and addressed.  A limited physical exam was performed with this format.  Please refer to the patient's chart for his consent to telehealth for [facility].   Date:  03/29/2019   ID:  Randy Moore, DOB 11-24-1965, MRN [id number]  Patient Location: Other:  Sitting in his car Provider Location: Home  PCP:  Patient, No Pcp Per  Cardiologist:  Sherren Mocha, MD   Electrophysiologist:  None   Evaluation Performed:  Follow-Up Visit  Chief Complaint:  FU on CHF  History of Present Illness:    Randy Moore is a 53 y.o. 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 He had a Myoview that showed a large scar but no ischemia.  The study was high risk due to low EF and scar.  He underwent an echocardiogram which showed an EF of 30-35.  He returns for further titration of his CHF medications.  Today, he notes he is doing well.  He plays soccer and basketball with friends and has no issues with chest discomfort or shortness of breath.  He has not had syncope, orthopnea, lower extremity swelling.  Unfortunately, he is out of his medications.  He has not taken losartan,  carvedilol or Lipitor in the past 3 weeks.  He is unable to check his blood pressure at home.  The patient does not have symptoms concerning for COVID-19 infection (fever, chills, cough, or new shortness of breath).    Past Medical History:  Diagnosis Date  . Acute right ventricular myocardial infarction ([place]) 09/21/2016  . Cardiogenic shock ([place]) 09/21/2016  . Chronic combined systolic and diastolic heart failure ([place]) 09/21/2016  . Coronary artery disease involving native coronary artery of native heart without angina pectoris 09/21/2016   a - Inf and RV infarct (STEMI) 1/18: LHC - pLAD, oD1 80, pRI 70, oLCx 30, mLCx 70, RCA 100, EF 45-50 >> PCI:  2.5 x 20 mm Promus DES to pRCA // Staged PCI 09/22/16: 3 x 38 mm Promus DES to mLAD; Attempted POBA to oD2 (80 >> 80); 3 x 28 mm Promus DES to RI  . History of ST elevation myocardial infarction (STEMI) 09/21/2016  . Hyperlipidemia 09/29/2016  . Hyperlipidemia 09/29/2016  . Ischemic cardiomyopathy 09/23/2016   a - s/p Inf and RV infarct (STEMI) 1/18 // b - Echo 09/23/16: EF 35-40, inf-septal HK, apical septal and apical AK, ant and inf AK, ant-septal AK, Gr 1 diastolic dysfunction // c -Echo 5/18: mild conc LVH, EF 35-40, Gr 1 DD, inf-septal, ant-septal, ant, apical AK  . Presence of drug coated stent in right coronary artery 09/21/2016  . S/P angioplasty with stent,  LAD and Ramus with DES 09/22/16 09/23/2016  . STEMI involving oth coronary artery of inferior wall ([place]) 09/21/2016   Past Surgical History:  Procedure Laterality Date  . CARDIAC CATHETERIZATION N/A 09/21/2016   Procedure: Left Heart Cath and Coronary Angiography;  Surgeon: Sherren Mocha, MD;  Location: [facility];  Service: Cardiovascular;  Laterality: N/A;  . CARDIAC CATHETERIZATION N/A 09/21/2016   Procedure: Coronary Stent Intervention;  Surgeon: Sherren Mocha, MD;  Location: [facility];  Service: Cardiovascular;  Laterality: N/A;  Prox RCA  . CARDIAC CATHETERIZATION N/A 09/22/2016    Procedure: Coronary CTO Intervention;  Surgeon: Burnell Blanks, MD;  Location: [facility];  Service: Cardiovascular;  Laterality: N/A;     Current Meds  Medication Sig  . aspirin EC 81 MG EC tablet Take 1 tablet (81 mg total) by mouth daily.  Marland Kitchen atorvastatin (LIPITOR) 80 MG tablet Take 1 tablet (80 mg total) by mouth every evening.  . carvedilol (COREG) 6.25 MG tablet Take 1 tablet (6.25 mg total) by mouth 2 (two) times daily with a meal.  . famotidine (PEPCID) 20 MG tablet Take 1 tablet (20 mg total) by mouth 2 (two) times daily.  Marland Kitchen losartan (COZAAR) 50 MG tablet Take 1 tablet (50 mg total) by mouth daily.  . nitroGLYCERIN (NITROSTAT) 0.4 MG SL tablet PLACE 1 TABLET UNDER TONGUE EVERY 5 MINUTES FOR 3 DOSES AS NEEDED FOR CHEST PAIN  . [DISCONTINUED] atorvastatin (LIPITOR) 80 MG tablet Take 1 tablet (80 mg total) by mouth every evening.  . [DISCONTINUED] carvedilol (COREG) 6.25 MG tablet Take 1 tablet (6.25 mg total) by mouth 2 (two) times daily with a meal.  . [DISCONTINUED] losartan (COZAAR) 50 MG tablet Take 1 tablet (50 mg total) by mouth daily.     Allergies:   Patient has no known allergies.   Social History   Tobacco Use  . Smoking status: Never Smoker  . Smokeless tobacco: Never Used  Substance Use Topics  . Alcohol use: No  . Drug use: No     Family Hx: The patient's family history includes Heart attack in his father; Other in his mother.  ROS:   Please see the history of present illness.    All other systems reviewed and are negative.   Prior CV studies:   The following studies were reviewed today:  Echocardiogram 02/18/2019 EF 30-35, Gr 1 DD, ant-sept and apical AK  Myoview 02/01/2019  EF 26, no ischemia, Lg scar (ant, ant-sept, inf, inf-sept, apical); High Risk  Echo 09/26/17 EF 35-40, ant-sept, ant, apical AK, Gr 1 DD  Echo 12/23/16 Mild conc LVH, EF 35-40, Gr 1 DD, inf-sept, ant-sept, ant, apical sept and ant AK  Echo 09/23/16 EF 35-40,  inf-septal HK, apical septal and apical AK, ant and inf AK, ant-septal AK, Gr 1 diastolic dysfunction   PCI 09/22/16 3 x 38 mm Promus DES to mLAD Attempted POBA to oD2 (80 >> 80) 3 x 28 mm Promus DES to RI  LHC 09/21/16 LAD prox 99, ostial D1 80 RI prox 70 LCx ost 30, mid 70 RCA 100 EF 45-50 PCI: 2.5 x 20 mm Promus DES to pRCA  Labs/Other Tests and Data Reviewed:    EKG:  No ECG reviewed.  Recent Labs: 10/26/2018: ALT 13; BUN 7; Creatinine, Ser 1.03; Potassium 3.7; Sodium 143   Recent Lipid Panel Lab Results  Component Value Date/Time   CHOL 185 10/26/2018 03:21 PM   TRIG 74 10/26/2018 03:21 PM  HDL 61 10/26/2018 03:21 PM   CHOLHDL 3.0 10/26/2018 03:21 PM   CHOLHDL 3.6 09/21/2016 03:46 AM   LDLCALC 109 (H) 10/26/2018 03:21 PM    Wt Readings from Last 3 Encounters:  03/29/19 173 lb (78.5 kg)  02/01/19 179 lb (81.2 kg)  01/24/19 179 lb (81.2 kg)     Objective:    Vital Signs:  Ht 5\' 8"  (1.727 m)   Wt 173 lb (78.5 kg)   BMI 26.30 kg/m    VITAL SIGNS:  reviewed GEN:  no acute distress EYES:  sclerae anicteric, EOMI - Extraocular Movements Intact RESPIRATORY:  Normal respiratory effort NEURO:  alert and oriented x 3, no obvious focal deficit PSYCH:  normal affect  ASSESSMENT & PLAN:    1. Chronic combined systolic and diastolic heart failure (HCC) EF 30-35 most recent echocardiogram.  Unfortunately, he is nonadherent to medications.  He has been out of his losartan and carvedilol for the past 3 weeks.  He is not able to check his blood pressure.  At this point, I am not certain if he would be able to tolerate Entresto.  He seems to be NYHA 1.  I will make sure his carvedilol and losartan are refilled.  I will bring him back for an in person visit with me in the next 3 to 4 weeks.  At that point, we will obtain a follow-up BMET.  Consider adding low-dose spironolactone if his creatinine and potassium are stable.  Also, if his blood pressure will tolerate, I will  consider changing losartan to Entresto.  We discussed the importance of medical therapy for congestive heart failure and the potential need for ICD implantation if his EF remains <35%.  2. Coronary artery disease involving native coronary artery of native heart without angina pectoris History of inferior MI in January 2018 treated with drug-eluting stent to the RCA and staged PCI with drug-eluting stent to the LAD and drug-eluting stent to the ramus intermedius.  Balloon angioplasty of the second diagonal was unsuccessful.  He is doing well without anginal symptoms.  Recent nuclear stress test demonstrated a large scar but no ischemia.  He is not having angina.  Continue aspirin.  Resume Lipitor, beta-blocker.  3. Pure hypercholesterolemia Restart atorvastatin.  Recent LDL 109.  Plan repeat labs once adherence is confirmed with statin therapy.  4. COVID-19 Education: The signs and symptoms of COVID-19 were discussed with the patient and how to seek care for testing (follow up with PCP or arrange E-visit).  The importance of social distancing was discussed today.  Time:   Today, I have spent 12.5 minutes with the patient with telehealth technology discussing the above problems.     Medication Adjustments/Labs and Tests Ordered: Current medicines are reviewed at length with the patient today.  Concerns regarding medicines are outlined above.   Tests Ordered: No orders of the defined types were placed in this encounter.   Medication Changes: Meds ordered this encounter  Medications  . atorvastatin (LIPITOR) 80 MG tablet    Sig: Take 1 tablet (80 mg total) by mouth every evening.    Dispense:  90 tablet    Refill:  3  . carvedilol (COREG) 6.25 MG tablet    Sig: Take 1 tablet (6.25 mg total) by mouth 2 (two) times daily with a meal.    Dispense:  180 tablet    Refill:  3  . losartan (COZAAR) 50 MG tablet    Sig: Take 1 tablet (50 mg total) by  mouth daily.    Dispense:  90 tablet     Refill:  3    Follow Up:  In Person 3-4 weeks  Signed, [name], PA-C  03/29/2019 1:20 PM    [facility]

## 2019-03-28 NOTE — Telephone Encounter (Signed)

## 2019-03-29 ENCOUNTER — Telehealth: Payer: Self-pay | Admitting: Licensed Clinical Social Worker

## 2019-03-29 ENCOUNTER — Other Ambulatory Visit: Payer: Self-pay

## 2019-03-29 ENCOUNTER — Telehealth (INDEPENDENT_AMBULATORY_CARE_PROVIDER_SITE_OTHER): Payer: Managed Care, Other (non HMO) | Admitting: Physician Assistant

## 2019-03-29 VITALS — Ht 68.0 in | Wt 173.0 lb

## 2019-03-29 DIAGNOSIS — Z7189 Other specified counseling: Secondary | ICD-10-CM

## 2019-03-29 DIAGNOSIS — I5042 Chronic combined systolic (congestive) and diastolic (congestive) heart failure: Secondary | ICD-10-CM | POA: Diagnosis not present

## 2019-03-29 DIAGNOSIS — E7800 Pure hypercholesterolemia, unspecified: Secondary | ICD-10-CM

## 2019-03-29 DIAGNOSIS — I2510 Atherosclerotic heart disease of native coronary artery without angina pectoris: Secondary | ICD-10-CM

## 2019-03-29 MED ORDER — CARVEDILOL 6.25 MG PO TABS: 6.2500 mg | ORAL_TABLET | Freq: Two times a day (BID) | ORAL | 3 refills | Status: DC

## 2019-03-29 MED ORDER — LOSARTAN POTASSIUM 50 MG PO TABS: 50.0000 mg | ORAL_TABLET | Freq: Every day | ORAL | 3 refills | Status: DC

## 2019-03-29 MED ORDER — ATORVASTATIN CALCIUM 80 MG PO TABS: 80.0000 mg | ORAL_TABLET | Freq: Every evening | ORAL | 3 refills | Status: DC

## 2019-03-29 NOTE — Telephone Encounter (Signed)
CSW referred to assist patient with obtaining a BP cuff. CSW contacted patient to inform cuff will be delivered to home. Patient grateful for support and assistance. CSW available as needed. Jackie Lorree Millar, LCSW, CCSW-MCS [phone]  

## 2019-03-29 NOTE — Patient Instructions (Signed)
Medication Instructions:   Your physician recommends that you continue on your current medications as directed. Please refer to the Current Medication list given to you today.  If you need a refill on your cardiac medications before your next appointment, please call your pharmacy.   Lab work:  Your physician recommends that you return for lab work in: 3 weeks on 04/19/19 for a BMET  If you have labs (blood work) drawn today and your tests are completely normal, you will receive your results only by: Marland Kitchen MyChart Message (if you have MyChart) OR . A paper copy in the mail If you have any lab test that is abnormal or we need to change your treatment, we will call you to review the results.  Testing/Procedures:  None ordered today  Follow-Up:  On 04/19/19 at 8:45 with [name], PA  Any Other Special Instructions Will Be Listed Below (If Applicable).  I will send a message to Raquel Sarna about blood pressure cuff.

## 2019-04-18 NOTE — Progress Notes (Deleted)
Cardiology Office Note:    Date:  04/18/2019   ID:  Randy Moore, DOB 27-Aug-1965, MRN [id number]  PCP:  Patient, No Pcp Per  Cardiologist:  Sherren Mocha, MD *** Electrophysiologist:  None   Referring MD: No ref. provider found   No chief complaint on file. ***  History of Present Illness:    Randy Moore is a 53 y.o. male with ***  Coronary artery disease  ? 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 Staged PCI 08/2016:  DES to mid LAD and DES to RI; POBA to D2 unsuccessful  ? Myoview 01/2019: Lg scar, no ischemia, EF 26  Ischemic CM  Combined systolic and diastolic CHF ? Echocardiogram 2/19: EF 35-40 ? Echocardiogram 01/2019: EF 30-35, Gr 1 DD  Hyperlipidemia   Randy Moore ***  Prior CV studies:   The following studies were reviewed today:  *** Echocardiogram 02/18/2019 EF 30-35, Gr 1 DD, ant-sept and apical AK Myoview 02/01/2019 EF 26, no ischemia, Lg scar (ant, ant-sept, inf, inf-sept, apical); High Risk  Echo 09/26/17 EF 35-40, ant-sept, ant, apical AK, Gr 1 DD  Echo 12/23/16 Mild conc LVH, EF 35-40, Gr 1 DD, inf-sept, ant-sept, ant, apical sept and ant AK  Echo 09/23/16 EF 35-40, inf-septal HK, apical septal and apical AK, ant and inf AK, ant-septal AK, Gr 1 diastolic dysfunction   PCI 09/22/16 3 x 38 mm Promus DES to mLAD Attempted POBA to oD2 (80 >> 80) 3 x 28 mm Promus DES to RI  LHC 09/21/16 LAD prox 99, ostial D1 80 RI prox 70 LCx ost 30, mid 70 RCA 100 EF 45-50 PCI: 2.5 x 20 mm Promus DES to pRCA   Past Medical History:  Diagnosis Date  . Acute right ventricular myocardial infarction ([place]) 09/21/2016  . Cardiogenic shock ([place]) 09/21/2016  . Chronic combined systolic and diastolic heart failure ([place]) 09/21/2016  . Coronary artery disease involving native coronary artery of native heart without angina pectoris 09/21/2016   a - Inf and RV infarct (STEMI) 1/18: LHC - pLAD, oD1 80, pRI 70, oLCx 30, mLCx 70, RCA  100, EF 45-50 >> PCI:  2.5 x 20 mm Promus DES to pRCA // Staged PCI 09/22/16: 3 x 38 mm Promus DES to mLAD; Attempted POBA to oD2 (80 >> 80); 3 x 28 mm Promus DES to RI  . History of ST elevation myocardial infarction (STEMI) 09/21/2016  . Hyperlipidemia 09/29/2016  . Hyperlipidemia 09/29/2016  . Ischemic cardiomyopathy 09/23/2016   a - s/p Inf and RV infarct (STEMI) 1/18 // b - Echo 09/23/16: EF 35-40, inf-septal HK, apical septal and apical AK, ant and inf AK, ant-septal AK, Gr 1 diastolic dysfunction // c -Echo 5/18: mild conc LVH, EF 35-40, Gr 1 DD, inf-septal, ant-septal, ant, apical AK  . Presence of drug coated stent in right coronary artery 09/21/2016  . S/P angioplasty with stent, LAD and Ramus with DES 09/22/16 09/23/2016  . STEMI involving oth coronary artery of inferior wall ([place]) 09/21/2016   Surgical Hx: The patient  has a past surgical history that includes Cardiac catheterization (N/A, 09/21/2016); Cardiac catheterization (N/A, 09/21/2016); and Cardiac catheterization (N/A, 09/22/2016).   Current Medications: No outpatient medications have been marked as taking for the 04/19/19 encounter (Appointment) with [name] T, PA-C.     Allergies:   Patient has no known allergies.   Social History   Tobacco Use  . Smoking status: Never Smoker  . Smokeless tobacco:  Never Used  Substance Use Topics  . Alcohol use: No  . Drug use: No     Family Hx: The patient's family history includes Heart attack in his father; Other in his mother.  ROS:   Please see the history of present illness.    ROS All other systems reviewed and are negative.   EKGs/Labs/Other Test Reviewed:    EKG:  EKG is *** ordered today.  The ekg ordered today demonstrates ***  Recent Labs: 10/26/2018: ALT 13; BUN 7; Creatinine, Ser 1.03; Potassium 3.7; Sodium 143   Recent Lipid Panel Lab Results  Component Value Date/Time   CHOL 185 10/26/2018 03:21 PM   TRIG 74 10/26/2018 03:21 PM   HDL 61 10/26/2018 03:21 PM    CHOLHDL 3.0 10/26/2018 03:21 PM   CHOLHDL 3.6 09/21/2016 03:46 AM   LDLCALC 109 (H) 10/26/2018 03:21 PM    Physical Exam:    VS:  There were no vitals taken for this visit.    Wt Readings from Last 3 Encounters:  03/29/19 173 lb (78.5 kg)  02/01/19 179 lb (81.2 kg)  01/24/19 179 lb (81.2 kg)     ***Physical Exam  ASSESSMENT & PLAN:    *** 1. Chronic combined systolic and diastolic heart failure (HCC) EF 30-35 most recent echocardiogram.  Unfortunately, he is nonadherent to medications.  He has been out of his losartan and carvedilol for the past 3 weeks.  He is not able to check his blood pressure.  At this point, I am not certain if he would be able to tolerate Entresto.  He seems to be NYHA 1.  I will make sure his carvedilol and losartan are refilled.  I will bring him back for an in person visit with me in the next 3 to 4 weeks.  At that point, we will obtain a follow-up BMET.  Consider adding low-dose spironolactone if his creatinine and potassium are stable.  Also, if his blood pressure will tolerate, I will consider changing losartan to Entresto.  We discussed the importance of medical therapy for congestive heart failure and the potential need for ICD implantation if his EF remains <35%.  2. Coronary artery disease involving native coronary artery of native heart without angina pectoris History of inferior MI in January 2018 treated with drug-eluting stent to the RCA and staged PCI with drug-eluting stent to the LAD and drug-eluting stent to the ramus intermedius.  Balloon angioplasty of the second diagonal was unsuccessful.  He is doing well without anginal symptoms.  Recent nuclear stress test demonstrated a large scar but no ischemia.  He is not having angina.  Continue aspirin.  Resume Lipitor, beta-blocker.  3. Pure hypercholesterolemia Restart atorvastatin.  Recent LDL 109.  Plan repeat labs once adherence is confirmed with statin therapy.  Dispo:  No follow-ups on file.    Medication Adjustments/Labs and Tests Ordered: Current medicines are reviewed at length with the patient today.  Concerns regarding medicines are outlined above.  Tests Ordered: No orders of the defined types were placed in this encounter.  Medication Changes: No orders of the defined types were placed in this encounter.   Signed, [name], PA-C  04/18/2019 8:19 PM    [facility] [place], [place], [place]  [address] Phone: [phone]; Fax: [phone]

## 2019-04-19 ENCOUNTER — Ambulatory Visit: Payer: Managed Care, Other (non HMO) | Admitting: Physician Assistant

## 2019-05-06 ENCOUNTER — Other Ambulatory Visit: Payer: Managed Care, Other (non HMO) | Admitting: *Deleted

## 2019-05-06 ENCOUNTER — Other Ambulatory Visit: Payer: Self-pay

## 2019-05-06 DIAGNOSIS — Z79899 Other long term (current) drug therapy: Secondary | ICD-10-CM

## 2019-05-06 DIAGNOSIS — E7800 Pure hypercholesterolemia, unspecified: Secondary | ICD-10-CM

## 2019-05-07 LAB — HEPATIC FUNCTION PANEL
ALT: 19 IU/L (ref 0–44)
AST: 25 IU/L (ref 0–40)
Albumin: 4.3 g/dL (ref 3.8–4.9)
Alkaline Phosphatase: 75 IU/L (ref 39–117)
Bilirubin Total: 0.6 mg/dL (ref 0.0–1.2)
Bilirubin, Direct: 0.15 mg/dL (ref 0.00–0.40)
Total Protein: 6.7 g/dL (ref 6.0–8.5)

## 2019-05-07 LAB — LIPID PANEL
Chol/HDL Ratio: 3.1 ratio (ref 0.0–5.0)
Cholesterol, Total: 176 mg/dL (ref 100–199)
HDL: 57 mg/dL (ref >39)
LDL Chol Calc (NIH): 101 mg/dL — ABNORMAL HIGH (ref 0–99)
Triglycerides: 102 mg/dL (ref 0–149)
VLDL Cholesterol Cal: 18 mg/dL (ref 5–40)

## 2019-05-08 ENCOUNTER — Telehealth: Payer: Self-pay

## 2019-05-08 DIAGNOSIS — E7800 Pure hypercholesterolemia, unspecified: Secondary | ICD-10-CM

## 2019-05-08 NOTE — Telephone Encounter (Signed)
Reviewed results with patient who verbalized understanding.   The patient states he has not taken his Lipitor in almost 2 months because he didn't know he had any available to pick up.  Reiterated to him refills were called in to [facility] early August. He will restart Lipitor and recheck fasting labs 12/16. He was grateful for assistance.

## 2019-05-08 NOTE — Telephone Encounter (Signed)
-----   Message from Sherren Mocha, MD sent at 05/07/2019  8:08 AM EDT ----- Lipids reviewed. LDL above goal. Please add zetia 10 mg as tolerated. Repeat lipids/LFT's 3 months. thx

## 2019-05-16 NOTE — Progress Notes (Deleted)
Cardiology Office Note:    Date:  05/17/2019   ID:  Randy Moore, DOB 1966-06-17, MRN [id number]  PCP:  Patient, No Pcp Per  Cardiologist:  Sherren Mocha, MD  Referring MD: No ref. provider found   No chief complaint on file. ***  History of Present Illness:    Randy Moore is a 53 y.o. male with a past medical history significant for CAD s/p inferior MI (RV infarct) 08/2016 treated with PCI/DES to RCA and staged DES to LAD and RI.  He had unsuccessful p.o. BA to D2.  The patient also has ischemic cardiomyopathy with chronic systolic and diastolic heart failure, EF 35-40% by echo in 09/2017.  He also has hyperlipidemia.  The patient had complaints of chest pain in 01/2019.  A Myoview 02/01/2019 showed a large scar but no ischemia.  The study was high risk due to low EF and scar.  He underwent echocardiogram which showed EF 30-35%.  The patient was last seen by telemedicine by [name], PA on 03/29/2019 for titration of heart failure medications.  He was doing well at the time, playing soccer and basketball with friends.  He had no heart failure symptoms.  NYHA I.  Unfortunately he had run out of his medications and had not been taking his losartan, carvedilol or Lipitor for the prior 3 weeks.  He was unable to check his blood pressure at home and thus could not be started on Entresto.  Labs on 05/06/2019 indicated an LDL of 101 however the patient had not been taking his statin.  We will recheck lipids 6 weeks after he restarted his statin.    ?  Switch to Entresto if BP will tolerate.  Or spiro Plan to check echocardiogram after full optimization of medical therapy.  He may be he may need to be considered for ICD if EF remains less than 35%.  Check be met today    Cardiac studies   Echocardiogram 02/18/2019 EF 30-35, Gr 1 DD, ant-sept and apical AK  Myoview 02/01/2019 EF 26, no ischemia, Lg scar (ant, ant-sept, inf, inf-sept, apical); High Risk  Echo 09/26/17 EF 35-40, ant-sept,  ant, apical AK, Gr 1 DD  Echo 12/23/16 Mild conc LVH, EF 35-40, Gr 1 DD, inf-sept, ant-sept, ant, apical sept and ant AK  Echo 09/23/16 EF 35-40, inf-septal HK, apical septal and apical AK, ant and inf AK, ant-septal AK, Gr 1 diastolic dysfunction   PCI 09/22/16 3 x 38 mm Promus DES to mLAD Attempted POBA to oD2 (80 >> 80) 3 x 28 mm Promus DES to RI  LHC 09/21/16 LAD prox 99, ostial D1 80 RI prox 70 LCx ost 30, mid 70 RCA 100 EF 45-50 PCI: 2.5 x 20 mm Promus DES to pRCA   Past Medical History:  Diagnosis Date  . Acute right ventricular myocardial infarction ([place]) 09/21/2016  . Cardiogenic shock ([place]) 09/21/2016  . Chronic combined systolic and diastolic heart failure ([place]) 09/21/2016  . Coronary artery disease involving native coronary artery of native heart without angina pectoris 09/21/2016   a - Inf and RV infarct (STEMI) 1/18: LHC - pLAD, oD1 80, pRI 70, oLCx 30, mLCx 70, RCA 100, EF 45-50 >> PCI:  2.5 x 20 mm Promus DES to pRCA // Staged PCI 09/22/16: 3 x 38 mm Promus DES to mLAD; Attempted POBA to oD2 (80 >> 80); 3 x 28 mm Promus DES to RI  . History of ST elevation myocardial infarction (STEMI) 09/21/2016  . Hyperlipidemia 09/29/2016  .  Hyperlipidemia 09/29/2016  . Ischemic cardiomyopathy 09/23/2016   a - s/p Inf and RV infarct (STEMI) 1/18 // b - Echo 09/23/16: EF 35-40, inf-septal HK, apical septal and apical AK, ant and inf AK, ant-septal AK, Gr 1 diastolic dysfunction // c -Echo 5/18: mild conc LVH, EF 35-40, Gr 1 DD, inf-septal, ant-septal, ant, apical AK  . Presence of drug coated stent in right coronary artery 09/21/2016  . S/P angioplasty with stent, LAD and Ramus with DES 09/22/16 09/23/2016  . STEMI involving oth coronary artery of inferior wall ([place]) 09/21/2016    Past Surgical History:  Procedure Laterality Date  . CARDIAC CATHETERIZATION N/A 09/21/2016   Procedure: Left Heart Cath and Coronary Angiography;  Surgeon: Sherren Mocha, MD;  Location: [facility];  Service:  Cardiovascular;  Laterality: N/A;  . CARDIAC CATHETERIZATION N/A 09/21/2016   Procedure: Coronary Stent Intervention;  Surgeon: Sherren Mocha, MD;  Location: [facility];  Service: Cardiovascular;  Laterality: N/A;  Prox RCA  . CARDIAC CATHETERIZATION N/A 09/22/2016   Procedure: Coronary CTO Intervention;  Surgeon: Burnell Blanks, MD;  Location: [facility];  Service: Cardiovascular;  Laterality: N/A;    Current Medications: No outpatient medications have been marked as taking for the 05/17/19 encounter (Appointment) with Daune Perch, NP.     Allergies:   Patient has no known allergies.   Social History   Socioeconomic History  . Marital status: Married    Spouse name: Not on file  . Number of children: Not on file  . Years of education: Not on file  . Highest education level: Not on file  Occupational History  . Not on file  Social Needs  . Financial resource strain: Not on file  . Food insecurity    Worry: Not on file    Inability: Not on file  . Transportation needs    Medical: Not on file    Non-medical: Not on file  Tobacco Use  . Smoking status: Never Smoker  . Smokeless tobacco: Never Used  Substance and Sexual Activity  . Alcohol use: No  . Drug use: No  . Sexual activity: Not on file  Lifestyle  . Physical activity    Days per week: Not on file    Minutes per session: Not on file  . Stress: Not on file  Relationships  . Social Herbalist on phone: Not on file    Gets together: Not on file    Attends religious service: Not on file    Active member of club or organization: Not on file    Attends meetings of clubs or organizations: Not on file    Relationship status: Not on file  Other Topics Concern  . Not on file  Social History Narrative  . Not on file     Family History: The patient's family history includes Heart attack in his father; Other in his mother. ROS:   Please see the history of present illness.     All  other systems reviewed and are negative.   EKG:  EKG is *** ordered today.  The ekg ordered today demonstrates ***  Recent Labs: 10/26/2018: BUN 7; Creatinine, Ser 1.03; Potassium 3.7; Sodium 143 05/06/2019: ALT 19   Recent Lipid Panel    Component Value Date/Time   CHOL 176 05/06/2019 1630   TRIG 102 05/06/2019 1630   HDL 57 05/06/2019 1630   CHOLHDL 3.1 05/06/2019 1630   CHOLHDL 3.6 09/21/2016 0346  VLDL 6 09/21/2016 0346   LDLCALC 109 (H) 10/26/2018 1521    Physical Exam:    VS:  There were no vitals taken for this visit.    Wt Readings from Last 6 Encounters:  03/29/19 173 lb (78.5 kg)  02/01/19 179 lb (81.2 kg)  01/24/19 179 lb (81.2 kg)  09/27/18 166 lb 12.8 oz (75.7 kg)  03/14/18 170 lb (77.1 kg)  09/19/17 162 lb 12.8 oz (73.8 kg)     Physical Exam***   ASSESSMENT:    1. Chronic combined systolic and diastolic heart failure ([place])   2. Coronary artery disease involving native coronary artery of native heart without angina pectoris   3. Hyperlipidemia, unspecified hyperlipidemia type    PLAN:    In order of problems listed above:  Chronic combined systolic and diastolic heart failure -EF 30-35% -Patient was noted to be nonadherent to his medical therapy at office visit 03/29/2019. -Patient is back on his losartan and carvedilol. -NYHA I  (Follow-up metabolic panel.  Consider adding spironolactone if creatinine and potassium are stable could consider changing losartan to Entresto)  -Recheck echocardiogram 3 months after optimization of medical therapy.  Would need to consider ICD implantation if EF remains less than 35%  CAD -History of inferior MI in January 2018 treated with DES to the RCA and staged PCI with DES to the LAD and ramus intermedius.  Balloon angioplasty of the second diagonal was unsuccessful. -On aspirin, Lipitor, beta-blocker -Patient had a Myoview in 01/2019 that showed no ischemia.  It was high risk due to low EF and prior scar.   Hyperlipidemia -Patient had been off of his medications, atorvastatin was restarted.  LDL was 109. -Plan to recheck lipids and LFT in 6 weeks.  Medication Adjustments/Labs and Tests Ordered: Current medicines are reviewed at length with the patient today.  Concerns regarding medicines are outlined above. Labs and tests ordered and medication changes are outlined in the patient instructions below:  There are no Patient Instructions on file for this visit.   Signed, Daune Perch, NP  05/17/2019 5:22 AM    [place]

## 2019-05-17 ENCOUNTER — Ambulatory Visit: Payer: Managed Care, Other (non HMO) | Admitting: Cardiology

## 2019-05-31 ENCOUNTER — Ambulatory Visit: Payer: Managed Care, Other (non HMO) | Admitting: Cardiology

## 2019-05-31 NOTE — Progress Notes (Deleted)
Randy Moore is a 53 y.o. male with a past medical history significant for CAD s/p inferior MI (RV infarct) 08/2016 treated with PCI/DES to RCA and staged DES to LAD and RI.  He had unsuccessful p.o. BA to D2.  The patient also has ischemic cardiomyopathy with chronic systolic and diastolic heart failure, EF 35-40% by echo in 09/2017.  He also has hyperlipidemia.  The patient had complaints of chest pain in 01/2019.  A Myoview 02/01/2019 showed a large scar but no ischemia.  The study was high risk due to low EF and scar.  He underwent echocardiogram which showed EF 30-35%.  The patient was last seen by telemedicine by [name], PA on 03/29/2019 for titration of heart failure medications.  He was doing well at the time, playing soccer and basketball with friends.  He had no heart failure symptoms.  NYHA I.  Unfortunately he had run out of his medications and had not been taking his losartan, carvedilol or Lipitor for the prior 3 weeks.  He was unable to check his blood pressure at home and thus could not be started on Entresto.  Labs on 05/06/2019 indicated an LDL of 101 however the patient had not been taking his statin.  We will recheck lipids 6 weeks after he restarted his statin.    ?  Switch to Entresto if BP will tolerate.  Or spiro Plan to check echocardiogram after full optimization of medical therapy.  He may be he may need to be considered for ICD if EF remains less than 35%.  Check be met today    Cardiac studies   Echocardiogram 02/18/2019 EF 30-35, Gr 1 DD, ant-sept and apical AK  Myoview 02/01/2019 EF 26, no ischemia, Lg scar (ant, ant-sept, inf, inf-sept, apical); High Risk  Echo 09/26/17 EF 35-40, ant-sept, ant, apical AK, Gr 1 DD  Echo 12/23/16 Mild conc LVH, EF 35-40, Gr 1 DD, inf-sept, ant-sept, ant, apical sept and ant AK  Echo 09/23/16 EF 35-40, inf-septal HK, apical septal and apical AK, ant and inf AK, ant-septal AK, Gr 1 diastolic dysfunction   PCI  09/22/16 3 x 38 mm Promus DES to mLAD Attempted POBA to oD2 (80 >> 80) 3 x 28 mm Promus DES to RI  LHC 09/21/16 LAD prox 99, ostial D1 80 RI prox 70 LCx ost 30, mid 70 RCA 100 EF 45-50 PCI: 2.5 x 20 mm Promus DES to pRCA       Past Medical History:  Diagnosis Date  . Acute right ventricular myocardial infarction ([place]) 09/21/2016  . Cardiogenic shock ([place]) 09/21/2016  . Chronic combined systolic and diastolic heart failure ([place]) 09/21/2016  . Coronary artery disease involving native coronary artery of native heart without angina pectoris 09/21/2016   a - Inf and RV infarct (STEMI) 1/18: LHC - pLAD, oD1 80, pRI 70, oLCx 30, mLCx 70, RCA 100, EF 45-50 >> PCI:  2.5 x 20 mm Promus DES to pRCA // Staged PCI 09/22/16: 3 x 38 mm Promus DES to mLAD; Attempted POBA to oD2 (80 >> 80); 3 x 28 mm Promus DES to RI  . History of ST elevation myocardial infarction (STEMI) 09/21/2016  . Hyperlipidemia 09/29/2016  . Hyperlipidemia 09/29/2016  . Ischemic cardiomyopathy 09/23/2016   a - s/p Inf and RV infarct (STEMI) 1/18 // b - Echo 09/23/16: EF 35-40, inf-septal HK, apical septal and apical AK, ant and inf AK, ant-septal AK, Gr 1 diastolic dysfunction // c -Echo 5/18: mild conc LVH, EF  35-40, Gr 1 DD, inf-septal, ant-septal, ant, apical AK  . Presence of drug coated stent in right coronary artery 09/21/2016  . S/P angioplasty with stent, LAD and Ramus with DES 09/22/16 09/23/2016  . STEMI involving oth coronary artery of inferior wall ([place]) 09/21/2016         Past Surgical History:  Procedure Laterality Date  . CARDIAC CATHETERIZATION N/A 09/21/2016   Procedure: Left Heart Cath and Coronary Angiography;  Surgeon: Sherren Mocha, MD;  Location: [facility];  Service: Cardiovascular;  Laterality: N/A;  . CARDIAC CATHETERIZATION N/A 09/21/2016   Procedure: Coronary Stent Intervention;  Surgeon: Sherren Mocha, MD;  Location: [facility];  Service: Cardiovascular;  Laterality: N/A;  Prox RCA  .  CARDIAC CATHETERIZATION N/A 09/22/2016   Procedure: Coronary CTO Intervention;  Surgeon: Burnell Blanks, MD;  Location: [facility];  Service: Cardiovascular;  Laterality: N/A;    Current Medications: No outpatient medications have been marked as taking for the 05/17/19 encounter (Appointment) with Daune Perch, NP.     Allergies:   Patient has no known allergies.   Social History        Socioeconomic History  . Marital status: Married    Spouse name: Not on file  . Number of children: Not on file  . Years of education: Not on file  . Highest education level: Not on file  Occupational History  . Not on file  Social Needs  . Financial resource strain: Not on file  . Food insecurity    Worry: Not on file    Inability: Not on file  . Transportation needs    Medical: Not on file    Non-medical: Not on file  Tobacco Use  . Smoking status: Never Smoker  . Smokeless tobacco: Never Used  Substance and Sexual Activity  . Alcohol use: No  . Drug use: No  . Sexual activity: Not on file  Lifestyle  . Physical activity    Days per week: Not on file    Minutes per session: Not on file  . Stress: Not on file  Relationships  . Social Herbalist on phone: Not on file    Gets together: Not on file    Attends religious service: Not on file    Active member of club or organization: Not on file    Attends meetings of clubs or organizations: Not on file    Relationship status: Not on file  Other Topics Concern  . Not on file  Social History Narrative  . Not on file     Family History: The patient's family history includes Heart attack in his father; Other in his mother. ROS:   Please see the history of present illness.     All other systems reviewed and are negative.   EKG:  EKG is *** ordered today.  The ekg ordered today demonstrates ***  Recent Labs: 10/26/2018: BUN 7; Creatinine, Ser 1.03; Potassium 3.7; Sodium  143 05/06/2019: ALT 19   Recent Lipid Panel         Component Value Date/Time   CHOL 176 05/06/2019 1630   TRIG 102 05/06/2019 1630   HDL 57 05/06/2019 1630   CHOLHDL 3.1 05/06/2019 1630   CHOLHDL 3.6 09/21/2016 0346   VLDL 6 09/21/2016 0346   LDLCALC 109 (H) 10/26/2018 1521    Physical Exam:    VS:  There were no vitals taken for this visit.  Wt Readings from Last 6 Encounters:  03/29/19 173 lb (78.5 kg)  02/01/19 179 lb (81.2 kg)  01/24/19 179 lb (81.2 kg)  09/27/18 166 lb 12.8 oz (75.7 kg)  03/14/18 170 lb (77.1 kg)  09/19/17 162 lb 12.8 oz (73.8 kg)     Physical Exam***   ASSESSMENT:    1. Chronic combined systolic and diastolic heart failure ([place])   2. Coronary artery disease involving native coronary artery of native heart without angina pectoris   3. Hyperlipidemia, unspecified hyperlipidemia type    PLAN:    In order of problems listed above:  Chronic combined systolic and diastolic heart failure -EF 30-35% -Patient was noted to be nonadherent to his medical therapy at office visit 03/29/2019. -Patient is back on his losartan and carvedilol. -NYHA I  (Follow-up metabolic panel.  Consider adding spironolactone if creatinine and potassium are stable could consider changing losartan to Entresto)  -Recheck echocardiogram 3 months after optimization of medical therapy.  Would need to consider ICD implantation if EF remains less than 35%  CAD -History of inferior MI in January 2018 treated with DES to the RCA and staged PCI with DES to the LAD and ramus intermedius.  Balloon angioplasty of the second diagonal was unsuccessful. -On aspirin, Lipitor, beta-blocker -Patient had a Myoview in 01/2019 that showed no ischemia.  It was high risk due to low EF and prior scar.  Hyperlipidemia -Patient had been off of his medications, atorvastatin was restarted.  LDL was 109. -Plan to recheck lipids and LFT in 6 weeks.

## 2019-06-04 ENCOUNTER — Other Ambulatory Visit: Payer: Self-pay | Admitting: Cardiovascular Disease

## 2019-06-04 MED ORDER — ATORVASTATIN CALCIUM 80 MG PO TABS: 80.0000 mg | ORAL_TABLET | Freq: Every evening | ORAL | 2 refills | Status: DC

## 2019-08-07 ENCOUNTER — Other Ambulatory Visit: Payer: Managed Care, Other (non HMO)

## 2019-10-30 ENCOUNTER — Encounter: Payer: Self-pay | Admitting: Gastroenterology

## 2019-11-11 ENCOUNTER — Telehealth: Payer: Self-pay | Admitting: *Deleted

## 2019-11-11 NOTE — Telephone Encounter (Signed)
Called patient and notified him of Dr.Nandigams recommendations. First available appointment made for Friday at 2:00 pm with [facility]. . Colon at [facility] and [facility] cancelled.  Pt is aware.

## 2019-11-11 NOTE — Telephone Encounter (Signed)
Dr.Nandigam,   This patient is referred to [place] for direct screening colonoscopy. Medical hx includes heart failure, STEMI 2018 with stents, HLD, cardiomyopathy. Echo on 02/18/2019 30-35%. Would you like the patient to have an office visit first or okay for direct hospital colonoscopy? Please advise. Thank you, Yuan Gann pv

## 2019-11-11 NOTE — Telephone Encounter (Signed)
Please schedule office visit next available with APP/extender.  Thank you

## 2019-11-15 ENCOUNTER — Encounter: Payer: Self-pay | Admitting: Physician Assistant

## 2019-11-15 ENCOUNTER — Ambulatory Visit: Payer: Managed Care, Other (non HMO) | Admitting: Physician Assistant

## 2019-11-15 VITALS — BP 130/86 | HR 80 | Temp 98.3°F | Ht 68.0 in | Wt 172.0 lb

## 2019-11-15 DIAGNOSIS — Z1211 Encounter for screening for malignant neoplasm of colon: Secondary | ICD-10-CM

## 2019-11-15 DIAGNOSIS — R931 Abnormal findings on diagnostic imaging of heart and coronary circulation: Secondary | ICD-10-CM

## 2019-11-15 DIAGNOSIS — Z1212 Encounter for screening for malignant neoplasm of rectum: Secondary | ICD-10-CM

## 2019-11-15 DIAGNOSIS — R1012 Left upper quadrant pain: Secondary | ICD-10-CM

## 2019-11-15 MED ORDER — NA SULFATE-K SULFATE-MG SULF 17.5-3.13-1.6 GM/177ML PO SOLN: 1.0000 | Freq: Once | ORAL | 0 refills | Status: AC

## 2019-11-15 NOTE — Progress Notes (Signed)
Chief Complaint: Preprocedural visit for screening colonoscopy in a patient with decreased ejection fraction due to ischemic cardiomyopathy  HPI:    Randy Moore is a 54 year old male with a past medical history as listed below, assigned to Dr. Silverio Decamp, who presents to clinic today for preprocedural visit given his decreased ejection fraction in the setting of ischemic cardiomyopathy.    Patient has a history of coronary artery disease with inferior MI in [id number] complicated by RV infarction.  He has ischemic cardiomyopathy with chronic combined systolic and diastolic heart failure.  At time of infarct he was treated with PCI using a drug-eluting stent in the RCA.  He underwent staged intervention of critical stenosis in the LAD during his hospitalization.  His post infarct LVEF is 30-35% via recent echo 02/18/2019.    Today, the patient resents clinic and has no GI complaints at all, other than some left upper quadrant pain which he has been experiencing which he initially thought was his heart.  He did see his cardiologist and they told him it was not cardiac.  Tells me his pain seems to come and go within 10 to 15 minutes.  He has not noticed if it is related to gas.  He is having normal bowel movements.    Denies fever, chills, weight loss or blood in stool.    Past Medical History:  Diagnosis Date  . Acute right ventricular myocardial infarction ([place]) 09/21/2016  . Cardiogenic shock ([place]) 09/21/2016  . Chronic combined systolic and diastolic heart failure ([place]) 09/21/2016  . Coronary artery disease involving native coronary artery of native heart without angina pectoris 09/21/2016   a - Inf and RV infarct (STEMI) 1/18: LHC - pLAD, oD1 80, pRI 70, oLCx 30, mLCx 70, RCA 100, EF 45-50 >> PCI:  2.5 x 20 mm Promus DES to pRCA // Staged PCI 09/22/16: 3 x 38 mm Promus DES to mLAD; Attempted POBA to oD2 (80 >> 80); 3 x 28 mm Promus DES to RI  . Enlarged prostate   . History of ST elevation myocardial infarction  (STEMI) 09/21/2016  . Hyperlipidemia 09/29/2016  . Hyperlipidemia 09/29/2016  . Hypertension   . Ischemic cardiomyopathy 09/23/2016   a - s/p Inf and RV infarct (STEMI) 1/18 // b - Echo 09/23/16: EF 35-40, inf-septal HK, apical septal and apical AK, ant and inf AK, ant-septal AK, Gr 1 diastolic dysfunction // c -Echo 5/18: mild conc LVH, EF 35-40, Gr 1 DD, inf-septal, ant-septal, ant, apical AK  . Noncompliance with medication regimen   . Presence of drug coated stent in right coronary artery 09/21/2016  . S/P angioplasty with stent, LAD and Ramus with DES 09/22/16 09/23/2016  . STEMI involving oth coronary artery of inferior wall ([place]) 09/21/2016    Past Surgical History:  Procedure Laterality Date  . CARDIAC CATHETERIZATION N/A 09/21/2016   Procedure: Left Heart Cath and Coronary Angiography;  Surgeon: Sherren Mocha, MD;  Location: [facility];  Service: Cardiovascular;  Laterality: N/A;  . CARDIAC CATHETERIZATION N/A 09/21/2016   Procedure: Coronary Stent Intervention;  Surgeon: Sherren Mocha, MD;  Location: [facility];  Service: Cardiovascular;  Laterality: N/A;  Prox RCA  . CARDIAC CATHETERIZATION N/A 09/22/2016   Procedure: Coronary CTO Intervention;  Surgeon: Burnell Blanks, MD;  Location: [facility];  Service: Cardiovascular;  Laterality: N/A;    Current Outpatient Medications  Medication Sig Dispense Refill  . aspirin EC 81 MG EC tablet Take 1 tablet (81 mg  total) by mouth daily.    Marland Kitchen atorvastatin (LIPITOR) 80 MG tablet Take 1 tablet (80 mg total) by mouth every evening. 90 tablet 2  . carvedilol (COREG) 6.25 MG tablet Take 1 tablet (6.25 mg total) by mouth 2 (two) times daily with a meal. 180 tablet 3  . famotidine (PEPCID) 20 MG tablet Take 1 tablet (20 mg total) by mouth 2 (two) times daily. 60 tablet 11  . ibuprofen (ADVIL) 800 MG tablet Take 800 mg by mouth every 8 (eight) hours as needed.    Marland Kitchen losartan (COZAAR) 50 MG tablet Take 1 tablet (50 mg total) by  mouth daily. 90 tablet 3  . nitroGLYCERIN (NITROSTAT) 0.4 MG SL tablet PLACE 1 TABLET UNDER TONGUE EVERY 5 MINUTES FOR 3 DOSES AS NEEDED FOR CHEST PAIN 25 tablet 4   No current facility-administered medications for this visit.    Allergies as of 11/15/2019  . (No Known Allergies)    Family History  Problem Relation Age of Onset  . Other Mother        chest pains  . Heart attack Father     Social History   Socioeconomic History  . Marital status: Married    Spouse name: Not on file  . Number of children: Not on file  . Years of education: Not on file  . Highest education level: Not on file  Occupational History  . Not on file  Tobacco Use  . Smoking status: Never Smoker  . Smokeless tobacco: Never Used  Substance and Sexual Activity  . Alcohol use: No  . Drug use: No  . Sexual activity: Not on file  Other Topics Concern  . Not on file  Social History Narrative  . Not on file   Social Determinants of Health   Financial Resource Strain:   . Difficulty of Paying Living Expenses:   Food Insecurity:   . Worried About Charity fundraiser in the Last Year:   . Arboriculturist in the Last Year:   Transportation Needs:   . Film/video editor (Medical):   Marland Kitchen Lack of Transportation (Non-Medical):   Physical Activity:   . Days of Exercise per Week:   . Minutes of Exercise per Session:   Stress:   . Feeling of Stress :   Social Connections:   . Frequency of Communication with Friends and Family:   . Frequency of Social Gatherings with Friends and Family:   . Attends Religious Services:   . Active Member of Clubs or Organizations:   . Attends Archivist Meetings:   Marland Kitchen Marital Status:   Intimate Partner Violence:   . Fear of Current or Ex-Partner:   . Emotionally Abused:   Marland Kitchen Physically Abused:   . Sexually Abused:     Review of Systems:    Constitutional: No weight loss, fever or chills Skin: No rash  Cardiovascular: No chest pain Respiratory: No  SOB  Gastrointestinal: See HPI and otherwise negative Genitourinary: No dysuria  Neurological: No headache Musculoskeletal: No new muscle or joint pain Hematologic: No bleeding  Psychiatric: No history of depression or anxiety   Physical Exam:  Vital signs: BP 130/86   Pulse 80   Temp 98.3 F (36.8 C)   Ht 5\' 8"  (1.727 m)   Wt 172 lb (78 kg)   BMI 26.15 kg/m   Constitutional:   Pleasant AA male appears to be in NAD, Well developed, Well nourished, alert and cooperative Head:  Normocephalic and  atraumatic. Eyes:   PEERL, EOMI. No icterus. Conjunctiva pink. Ears:  Normal auditory acuity. Neck:  Supple Throat: Oral cavity and pharynx without inflammation, swelling or lesion.  Respiratory: Respirations even and unlabored. Lungs clear to auscultation bilaterally.   No wheezes, crackles, or rhonchi.  Cardiovascular: Normal S1, S2. No MRG. Regular rate and rhythm. No peripheral edema, cyanosis or pallor.  Gastrointestinal:  Soft, nondistended, nontender. No rebound or guarding. Normal bowel sounds. No appreciable masses or hepatomegaly. Rectal:  Not performed.  Msk:  Symmetrical without gross deformities. Without edema, no deformity or joint abnormality.  Neurologic:  Alert and  oriented x4;  grossly normal neurologically.  Skin:   Dry and intact without significant lesions or rashes. Psychiatric: Demonstrates good judgement and reason without abnormal affect or behaviors.  No recent labs/imaging.  Assessment: 1.  Screening for colorectal cancer: Patient due for screening for colorectal cancer 2.  Decreased ejection fraction related to ischemic cardiomyopathy 3.  Left upper quadrant pain: Consider splenic flexure syndrome  Plan: 1.  Scheduled patient for his screening colonoscopy at the hospital due to decreased ejection fraction.  This will be scheduled Dr. Silverio Decamp when her schedule is available.  Did discuss risks, benefits, limitations and alternatives and patient agrees to  proceed. 2.  Discussed with patient that his left upper quadrant pain may be related to gas, told him to pay more attention to whether or not passing gas relieves this pain. 3.  Patient will follow in clinic per recommendations from Dr. Silverio Decamp after time of procedure.  Ellouise Newer, PA-C [facility] 11/15/2019, 2:06 PM  Cc: Shanon Rosser, PA-C

## 2019-11-15 NOTE — Patient Instructions (Signed)
If you are age 56 or older, your body mass index should be between 23-30. Your Body mass index is 26.15 kg/m. If this is out of the aforementioned range listed, please consider follow up with your Primary Care Provider.  If you are age 48 or younger, your body mass index should be between 19-25. Your Body mass index is 26.15 kg/m. If this is out of the aformentioned range listed, please consider follow up with your Primary Care Provider.   You have been scheduled for a colonoscopy. Please follow written instructions given to you at your visit today.  Please pick up your prep supplies at the pharmacy within the next 1-3 days. If you use inhalers (even only as needed), please bring them with you on the day of your procedure.  Follow up pending results of procedure

## 2019-11-25 NOTE — Progress Notes (Signed)
Reviewed and agree with documentation and assessment and plan. K. Veena Lyndsey Demos , MD   

## 2019-11-29 ENCOUNTER — Encounter: Payer: Managed Care, Other (non HMO) | Admitting: Gastroenterology

## 2019-12-04 ENCOUNTER — Encounter: Payer: Self-pay | Admitting: Physician Assistant

## 2019-12-04 ENCOUNTER — Other Ambulatory Visit: Payer: Self-pay

## 2019-12-04 ENCOUNTER — Ambulatory Visit: Payer: Managed Care, Other (non HMO) | Admitting: Physician Assistant

## 2019-12-04 VITALS — BP 180/80 | HR 61 | Ht 68.0 in | Wt 168.6 lb

## 2019-12-04 DIAGNOSIS — E782 Mixed hyperlipidemia: Secondary | ICD-10-CM | POA: Diagnosis not present

## 2019-12-04 DIAGNOSIS — I2510 Atherosclerotic heart disease of native coronary artery without angina pectoris: Secondary | ICD-10-CM

## 2019-12-04 DIAGNOSIS — I5042 Chronic combined systolic (congestive) and diastolic (congestive) heart failure: Secondary | ICD-10-CM

## 2019-12-04 DIAGNOSIS — I10 Essential (primary) hypertension: Secondary | ICD-10-CM

## 2019-12-04 MED ORDER — LOSARTAN POTASSIUM 50 MG PO TABS: 50.0000 mg | ORAL_TABLET | Freq: Every day | ORAL | 3 refills | Status: DC

## 2019-12-04 MED ORDER — CARVEDILOL 6.25 MG PO TABS: 6.2500 mg | ORAL_TABLET | Freq: Two times a day (BID) | ORAL | 3 refills | Status: DC

## 2019-12-04 NOTE — Patient Instructions (Signed)
Medication Instructions:   Your physician has recommended you make the following change in your medication:   1) Restart Losartan 50 mg, 1 tablet by mouth once a day 2) Increase Carvedilol to 6.25 mg, 1 tablet by mouth twice a day  *If you need a refill on your cardiac medications before your next appointment, please call your pharmacy*  Lab Work:  Your physician recommends that you return for lab work in 2 weeks   Testing/Procedures:  None ordered today  Follow-Up: At [facility], you and your health needs are our priority.  As part of our continuing mission to provide you with exceptional heart care, we have created designated Provider Care Teams.  These Care Teams include your primary Cardiologist (physician) and Advanced Practice Providers (APPs -  Physician Assistants and Nurse Practitioners) who all work together to provide you with the care you need, when you need it.  We recommend signing up for the patient portal called "MyChart".  Sign up information is provided on this After Visit Summary.  MyChart is used to connect with patients for Virtual Visits (Telemedicine).  Patients are able to view lab/test results, encounter notes, upcoming appointments, etc.  Non-urgent messages can be sent to your provider as well.   To learn more about what you can do with MyChart, go to [email or web address].    Your next appointment:    On 12/20/19 at 9:15AM with [name], PA-C

## 2019-12-04 NOTE — Progress Notes (Signed)
Cardiology Office Note:    Date:  12/04/2019   ID:  [name], DOB [birth year], MRN [id number]  PCP:  Shanon Rosser, PA-C  Cardiologist:  Sherren Mocha, MD   Electrophysiologist:  None   Referring MD: No ref. provider found   Chief Complaint:  Follow-up (CHF)    Patient Profile:    Randy Moore is a 54 y.o. male with:   Coronary artery disease  ? 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 Staged PCI 08/2016:  DES to mid LAD and DES to RI; POBA to D2 unsuccessful ? Myoview 01/2019: EF 26, large anterior scar, no ischemia  Ischemic CM  Combined systolic and diastolic CHF ? Echocardiogram 2/19: EF 35-40 ? Echo 01/2019: EF 30-35, GR 1 DD  Hyperlipidemia   Medication nonadherence  Prior CV studies: Echocardiogram 02/18/2019 EF 30-35, Gr 1 DD, ant-sept and apical AK  Myoview 02/01/2019 EF 26, no ischemia, Lg scar (ant, ant-sept, inf, inf-sept, apical); High Risk  Echo 09/26/17 EF 35-40, ant-sept, ant, apical AK, Gr 1 DD  Echo 12/23/16 Mild conc LVH, EF 35-40, Gr 1 DD, inf-sept, ant-sept, ant, apical sept and ant AK  Echo 09/23/16 EF 35-40, inf-septal HK, apical septal and apical AK, ant and inf AK, ant-septal AK, Gr 1 diastolic dysfunction   PCI 09/22/16 3 x 38 mm Promus DES to mLAD Attempted POBA to oD2 (80 >> 80) 3 x 28 mm Promus DES to RI  LHC 09/21/16 LAD prox 99, ostial D1 80 RI prox 70 LCx ost 30, mid 70 RCA 100 EF 45-50 PCI: 2.5 x 20 mm Promus DES to pRCA  History of Present Illness:    Mr. Dylla was last seen via telemedicine in August 2020.  He had run out of his losartan and carvedilol and had been off of both of these for 3 weeks.  I placed him back on his medications and requested a follow-up in 3 to 4 weeks.  However, this is his first visit back.  He is here alone today.  He is again taking carvedilol incorrectly (once daily instead of twice daily).  He is also off of Losartan.  His BP is uncontrolled.  Luckily,  he is doing well without chest pain, shortness of breath, syncope, orthopnea, leg swelling.  He remains fairly active.  He usually plays soccer in the spring and summer.      Past Medical History:  Diagnosis Date  . Acute right ventricular myocardial infarction ([place]) 09/21/2016  . Cardiogenic shock ([place]) 09/21/2016  . Chronic combined systolic and diastolic heart failure ([place]) 09/21/2016  . Coronary artery disease involving native coronary artery of native heart without angina pectoris 09/21/2016   a - Inf and RV infarct (STEMI) 1/18: LHC - pLAD, oD1 80, pRI 70, oLCx 30, mLCx 70, RCA 100, EF 45-50 >> PCI:  2.5 x 20 mm Promus DES to pRCA // Staged PCI 09/22/16: 3 x 38 mm Promus DES to mLAD; Attempted POBA to oD2 (80 >> 80); 3 x 28 mm Promus DES to RI  . Enlarged prostate   . History of ST elevation myocardial infarction (STEMI) 09/21/2016  . Hyperlipidemia 09/29/2016  . Hyperlipidemia 09/29/2016  . Hypertension   . Ischemic cardiomyopathy 09/23/2016   a - s/p Inf and RV infarct (STEMI) 1/18 // b - Echo 09/23/16: EF 35-40, inf-septal HK, apical septal and apical AK, ant and inf AK, ant-septal AK, Gr 1 diastolic dysfunction // c -Echo 5/18: mild conc  LVH, EF 35-40, Gr 1 DD, inf-septal, ant-septal, ant, apical AK  . Noncompliance with medication regimen   . Presence of drug coated stent in right coronary artery 09/21/2016  . S/P angioplasty with stent, LAD and Ramus with DES 09/22/16 09/23/2016  . STEMI involving oth coronary artery of inferior wall ([place]) 09/21/2016    Current Medications: Current Meds  Medication Sig  . aspirin EC 81 MG EC tablet Take 1 tablet (81 mg total) by mouth daily.  Marland Kitchen atorvastatin (LIPITOR) 80 MG tablet Take 1 tablet (80 mg total) by mouth every evening.  . carvedilol (COREG) 6.25 MG tablet Take 1 tablet (6.25 mg total) by mouth in the morning and at bedtime.  . famotidine (PEPCID) 20 MG tablet Take 20 mg by mouth daily.  . nitroGLYCERIN (NITROSTAT) 0.4 MG SL tablet PLACE 1 TABLET  UNDER TONGUE EVERY 5 MINUTES FOR 3 DOSES AS NEEDED FOR CHEST PAIN  . [DISCONTINUED] carvedilol (COREG) 6.25 MG tablet Take 6.25 mg by mouth in the morning and at bedtime.     Allergies:   Patient has no known allergies.   Social History   Tobacco Use  . Smoking status: Never Smoker  . Smokeless tobacco: Never Used  Substance Use Topics  . Alcohol use: No  . Drug use: No     Family Hx: The patient's family history includes Heart attack in his father; Other in his mother. There is no history of Colon cancer, Liver cancer, Stomach cancer, Rectal cancer, or Esophageal cancer.  ROS   EKGs/Labs/Other Test Reviewed:    EKG:  EKG is   ordered today.  The ekg ordered today demonstrates normal sinus rhythm, HR 61 normal axis, j point elevation, non-specific ST-TW changes, QTc 432  Recent Labs: 05/06/2019: ALT 19   Recent Lipid Panel Lab Results  Component Value Date/Time   CHOL 176 05/06/2019 04:30 PM   TRIG 102 05/06/2019 04:30 PM   HDL 57 05/06/2019 04:30 PM   CHOLHDL 3.1 05/06/2019 04:30 PM   CHOLHDL 3.6 09/21/2016 03:46 AM   LDLCALC 101 (H) 05/06/2019 04:30 PM    Physical Exam:    VS:  BP (!) 180/80   Pulse 61   Ht 5\' 8"  (1.727 m)   Wt 168 lb 9.6 oz (76.5 kg)   SpO2 98%   BMI 25.64 kg/m     Wt Readings from Last 3 Encounters:  12/04/19 168 lb 9.6 oz (76.5 kg)  11/15/19 172 lb (78 kg)  03/29/19 173 lb (78.5 kg)     Constitutional:      Appearance: Healthy appearance. Not in distress.  Neck:     Thyroid: Thyroid normal.     Vascular: JVD normal.  Pulmonary:     Effort: Pulmonary effort is normal.     Breath sounds: No wheezing. No rales.  Cardiovascular:     Normal rate. Regular rhythm. Normal S1. Normal S2.     Murmurs: There is no murmur.  Abdominal:     Palpations: Abdomen is soft. There is no hepatomegaly.  Skin:    General: Skin is warm and dry.  Neurological:     General: No focal deficit present.     Mental Status: Alert and oriented to person,  place and time.     Cranial Nerves: Cranial nerves are intact.      ASSESSMENT & PLAN:    1. Chronic combined systolic and diastolic heart failure (HCC) Ischemic CM.  NYHA 1-2.  Volume status stable.  EF 30-35 most  recent echocardiogram.  He remains non-adherent to medications.  I have explained the importance of taking these medications for his CHF as well as to control his BP.  He is taking Carvedilol incorrectly (once daily instead of twice daily).  He is not taking Losartan.  -Increase Carvedilol to 6.25 mg twice daily   -Restart Losartan 50 mg once daily   -Obtain CMET in 2 weeks  -Fu with me in 2 weeks  -Consider Spironolactone if BP will allow  -Repeat echocardiogram once BP controlled and Rx maximized  -If EF remains < 35, refer to EP  2. Coronary artery disease involving native coronary artery of native heart without angina pectoris History of inferior MI in January 2018 treated with drug-eluting stent to the RCA and staged PCI with drug-eluting stent to the LAD and drug-eluting stent to the ramus intermedius.  He remains active without angina.  He admits to taking his Atorvastatin.  Continue ASA, Atorvastatin.  Increase dose of beta-blocker to twice daily as noted and resume angiotensin receptor blocker with Losartan 50 mg once daily.    3. Hypertension  Uncontrolled.  I had a long, frank discussion with him regarding poor control of his BP.  I warned him that uncontrolled BP can lead to recurrent MI, stroke, aortic dissection, kidney failure and death.  He understands this and is committed to taking his medications correctly.  Increase Carvedilol and resume Losartan as noted above.   4. Mixed hyperlipidemia Continue high intensity statin.  Obtain fasting Lipids, CMET in 2 weeks.    Dispo:  Return in about 2 weeks (around 12/18/2019) for Close Follow Up, w/ [name], PA-C, in person.   Medication Adjustments/Labs and Tests Ordered: Current medicines are reviewed at length  with the patient today.  Concerns regarding medicines are outlined above.  Tests Ordered: Orders Placed This Encounter  Procedures  . Comprehensive metabolic panel  . Lipid panel  . EKG 12-Lead   Medication Changes: Meds ordered this encounter  Medications  . losartan (COZAAR) 50 MG tablet    Sig: Take 1 tablet (50 mg total) by mouth daily.    Dispense:  90 tablet    Refill:  3  . carvedilol (COREG) 6.25 MG tablet    Sig: Take 1 tablet (6.25 mg total) by mouth in the morning and at bedtime.    Dispense:  180 tablet    Refill:  3    Signed, [name], PA-C  12/04/2019 [facility] [address], [place], [place]  [address] Phone: [phone]; Fax: [phone]

## 2019-12-10 ENCOUNTER — Other Ambulatory Visit: Payer: Self-pay

## 2019-12-10 MED ORDER — LOSARTAN POTASSIUM 50 MG PO TABS: 50.0000 mg | ORAL_TABLET | Freq: Every day | ORAL | 3 refills | Status: DC

## 2019-12-10 MED ORDER — ATORVASTATIN CALCIUM 80 MG PO TABS: 80.0000 mg | ORAL_TABLET | Freq: Every evening | ORAL | 2 refills | Status: DC

## 2019-12-10 MED ORDER — CARVEDILOL 6.25 MG PO TABS: 6.2500 mg | ORAL_TABLET | Freq: Two times a day (BID) | ORAL | 3 refills | Status: DC

## 2019-12-10 NOTE — Addendum Note (Signed)
Addended by: Derl Barrow on: 12/10/2019 10:59 AM   Modules accepted: Orders

## 2019-12-12 ENCOUNTER — Other Ambulatory Visit (HOSPITAL_COMMUNITY)
Admission: RE | Admit: 2019-12-12 | Discharge: 2019-12-12 | Disposition: A | Discharge status: Home / Self Care | Payer: Managed Care, Other (non HMO) | Source: Ambulatory Visit | Attending: Gastroenterology | Admitting: Gastroenterology

## 2019-12-12 DIAGNOSIS — Z20822 Contact with and (suspected) exposure to covid-19: Secondary | ICD-10-CM | POA: Insufficient documentation

## 2019-12-12 DIAGNOSIS — Z01812 Encounter for preprocedural laboratory examination: Secondary | ICD-10-CM | POA: Diagnosis present

## 2019-12-12 LAB — SARS CORONAVIRUS 2 (TAT 6-24 HRS): SARS Coronavirus 2: NEGATIVE

## 2019-12-14 ENCOUNTER — Telehealth: Payer: Self-pay | Admitting: Physician Assistant

## 2019-12-14 NOTE — Telephone Encounter (Signed)
Pt says he did not get Rx for mondays bowel prep. I gave him directions to take dulcolax mid day and split dose miralax/gatorade prep to start at 5 pm.  He wrote down information as we spoke

## 2019-12-16 ENCOUNTER — Ambulatory Visit (HOSPITAL_COMMUNITY): Payer: Managed Care, Other (non HMO) | Admitting: Certified Registered Nurse Anesthetist

## 2019-12-16 ENCOUNTER — Encounter (HOSPITAL_COMMUNITY): Payer: Self-pay | Admitting: Gastroenterology

## 2019-12-16 ENCOUNTER — Encounter (HOSPITAL_COMMUNITY)
Admission: RE | Disposition: A | Discharge status: Home / Self Care | Payer: Self-pay | Source: Home / Self Care | Attending: Gastroenterology

## 2019-12-16 ENCOUNTER — Ambulatory Visit (HOSPITAL_COMMUNITY)
Admission: RE | Admit: 2019-12-16 | Discharge: 2019-12-16 | Disposition: A | Discharge status: Home / Self Care | Payer: Managed Care, Other (non HMO) | Attending: Gastroenterology | Admitting: Gastroenterology

## 2019-12-16 DIAGNOSIS — K635 Polyp of colon: Secondary | ICD-10-CM | POA: Diagnosis not present

## 2019-12-16 DIAGNOSIS — E785 Hyperlipidemia, unspecified: Secondary | ICD-10-CM | POA: Insufficient documentation

## 2019-12-16 DIAGNOSIS — I252 Old myocardial infarction: Secondary | ICD-10-CM | POA: Diagnosis not present

## 2019-12-16 DIAGNOSIS — D123 Benign neoplasm of transverse colon: Secondary | ICD-10-CM | POA: Insufficient documentation

## 2019-12-16 DIAGNOSIS — Z79899 Other long term (current) drug therapy: Secondary | ICD-10-CM | POA: Insufficient documentation

## 2019-12-16 DIAGNOSIS — I110 Hypertensive heart disease with heart failure: Secondary | ICD-10-CM | POA: Diagnosis not present

## 2019-12-16 DIAGNOSIS — Z1211 Encounter for screening for malignant neoplasm of colon: Secondary | ICD-10-CM

## 2019-12-16 DIAGNOSIS — Z955 Presence of coronary angioplasty implant and graft: Secondary | ICD-10-CM | POA: Diagnosis not present

## 2019-12-16 DIAGNOSIS — K648 Other hemorrhoids: Secondary | ICD-10-CM | POA: Diagnosis not present

## 2019-12-16 DIAGNOSIS — I255 Ischemic cardiomyopathy: Secondary | ICD-10-CM | POA: Diagnosis not present

## 2019-12-16 DIAGNOSIS — I5042 Chronic combined systolic (congestive) and diastolic (congestive) heart failure: Secondary | ICD-10-CM | POA: Diagnosis not present

## 2019-12-16 DIAGNOSIS — I2510 Atherosclerotic heart disease of native coronary artery without angina pectoris: Secondary | ICD-10-CM | POA: Insufficient documentation

## 2019-12-16 DIAGNOSIS — Z7982 Long term (current) use of aspirin: Secondary | ICD-10-CM | POA: Diagnosis not present

## 2019-12-16 HISTORY — PX: COLONOSCOPY WITH PROPOFOL: SHX5780

## 2019-12-16 HISTORY — PX: POLYPECTOMY: SHX5525

## 2019-12-16 SURGERY — COLONOSCOPY WITH PROPOFOL
Anesthesia: Monitor Anesthesia Care

## 2019-12-16 MED ORDER — PROPOFOL 500 MG/50ML IV EMUL
INTRAVENOUS | Status: DC | PRN
  Administered 2019-12-16: 125 ug/kg/min via INTRAVENOUS

## 2019-12-16 MED ORDER — PROPOFOL 10 MG/ML IV BOLUS
INTRAVENOUS | Status: DC | PRN
  Administered 2019-12-16 (×2): 20 mg via INTRAVENOUS

## 2019-12-16 MED ORDER — ONDANSETRON HCL 4 MG/2ML IJ SOLN
INTRAMUSCULAR | Status: DC | PRN
  Administered 2019-12-16: 4 mg via INTRAVENOUS

## 2019-12-16 MED ORDER — PHENYLEPHRINE 40 MCG/ML (10ML) SYRINGE FOR IV PUSH (FOR BLOOD PRESSURE SUPPORT)
PREFILLED_SYRINGE | INTRAVENOUS | Status: DC | PRN
  Administered 2019-12-16: 60 ug via INTRAVENOUS
  Administered 2019-12-16: 40 ug via INTRAVENOUS
  Administered 2019-12-16: 80 ug via INTRAVENOUS

## 2019-12-16 MED ORDER — LIDOCAINE 2% (20 MG/ML) 5 ML SYRINGE
INTRAMUSCULAR | Status: DC | PRN
  Administered 2019-12-16: 40 mg via INTRAVENOUS

## 2019-12-16 MED ORDER — LACTATED RINGERS IV SOLN
INTRAVENOUS | Status: DC
  Administered 2019-12-16: 11:00:00 1000 mL via INTRAVENOUS

## 2019-12-16 MED ORDER — SODIUM CHLORIDE 0.9 % IV SOLN: INTRAVENOUS | Status: DC

## 2019-12-16 SURGICAL SUPPLY — 21 items

## 2019-12-16 NOTE — Op Note (Signed)
[facility] Patient Name: Randy Moore Procedure Date: 12/16/2019 MRN: [id number] Attending MD: Mauri Pole , MD Date of Birth: Sep 30, 1965 CSN: [id number] Age: 54 Admit Type: Outpatient Procedure:                Colonoscopy Indications:              Screening for colorectal malignant neoplasm Providers:                Mauri Pole, MD, Josie Dixon, RN,                            Marguerita Merles, Technician Referring MD:              Medicines:                Monitored Anesthesia Care Complications:            No immediate complications. Estimated Blood Loss:     Estimated blood loss was minimal. Procedure:                Pre-Anesthesia Assessment:                           - Prior to the procedure, a History and Physical                            was performed, and patient medications and                            allergies were reviewed. The patient's tolerance of                            previous anesthesia was also reviewed. The risks                            and benefits of the procedure and the sedation                            options and risks were discussed with the patient.                            All questions were answered, and informed consent                            was obtained. Prior Anticoagulants: The patient has                            taken no previous anticoagulant or antiplatelet                            agents. ASA Grade Assessment: III - A patient with                            severe systemic disease. After reviewing the risks  and benefits, the patient was deemed in                            satisfactory condition to undergo the procedure.                           After obtaining informed consent, the colonoscope                            was passed under direct vision. Throughout the                            procedure, the patient's blood pressure, pulse, and                             oxygen saturations were monitored continuously. The                            [id number] Olympus pediatric colonscope                            was introduced through the anus and advanced to the                            the cecum, identified by appendiceal orifice and                            ileocecal valve. The colonoscopy was performed                            without difficulty. The patient tolerated the                            procedure well. The quality of the bowel                            preparation was good. The ileocecal valve,                            appendiceal orifice, and rectum were photographed. Scope In: 12:03:18 PM Scope Out: 12:24:52 PM Scope Withdrawal Time: 0 hours 11 minutes 35 seconds  Total Procedure Duration: 0 hours 21 minutes 34 seconds  Findings:      The perianal and digital rectal examinations were normal.      A less than 1 mm polyp was found in the transverse colon. The polyp was       sessile. The polyp was removed with a cold biopsy forceps. Resection and       retrieval were complete.      Non-bleeding internal hemorrhoids were found during retroflexion. The       hemorrhoids were small.      The exam was otherwise without abnormality. Impression:               - One less than 1 mm polyp in the transverse colon,  removed with a cold biopsy forceps. Resected and                            retrieved.                           - Non-bleeding internal hemorrhoids.                           - The examination was otherwise normal. Moderate Sedation:      Not Applicable - Patient had care per Anesthesia. Recommendation:           - Patient has a contact number available for                            emergencies. The signs and symptoms of potential                            delayed complications were discussed with the                            patient. Return to normal activities tomorrow.                             Written discharge instructions were provided to the                            patient.                           - Resume previous diet.                           - Continue present medications.                           - Await pathology results.                           - Repeat colonoscopy in 5-10 years for surveillance                            based on pathology results. Procedure Code(s):        --- Professional ---                           [phone], Colonoscopy, flexible; with biopsy, single                            or multiple Diagnosis Code(s):        --- Professional ---                           Z12.11, Encounter for screening for malignant                            neoplasm of colon  K63.5, Polyp of colon                           K64.8, Other hemorrhoids CPT copyright 2019 American Medical Association. All rights reserved. The codes documented in this report are preliminary and upon coder review may  be revised to meet current compliance requirements. Mauri Pole, MD 12/16/2019 [id number] PM This report has been signed electronically. Number of Addenda: 0

## 2019-12-16 NOTE — Telephone Encounter (Signed)
Thank you Sarah. Beth, can you please check what happened? Thanks

## 2019-12-16 NOTE — Telephone Encounter (Signed)
The prescription was sent in on 11/15/19. The pharmacy says it was picked up on 12/14/19.

## 2019-12-16 NOTE — Anesthesia Postprocedure Evaluation (Signed)
Anesthesia Post Note  Patient: Randy Moore  Procedure(s) Performed: COLONOSCOPY WITH PROPOFOL (N/A ) POLYPECTOMY     Patient location during evaluation: Endoscopy Anesthesia Type: MAC Level of consciousness: awake Pain management: pain level controlled Vital Signs Assessment: post-procedure vital signs reviewed and stable Respiratory status: spontaneous breathing Cardiovascular status: stable Postop Assessment: no apparent nausea or vomiting Anesthetic complications: no    Last Vitals:  Vitals:   12/16/19 1054 12/16/19 1231  BP: (!) 169/105 139/80  Pulse: 64 62  Resp: (!) 8 15  Temp: 37 C (!) 36.4 C  SpO2:  100%    Last Pain:  Vitals:   12/16/19 1231  TempSrc: Axillary  PainSc: Asleep                 Crissa Sowder

## 2019-12-16 NOTE — Telephone Encounter (Signed)
Ok thanks 

## 2019-12-16 NOTE — Transfer of Care (Signed)
Immediate Anesthesia Transfer of Care Note  Patient: Randy Moore  Procedure(s) Performed: COLONOSCOPY WITH PROPOFOL (N/A ) POLYPECTOMY  Patient Location: PACU and Endoscopy Unit  Anesthesia Type:MAC  Level of Consciousness: awake, alert  and oriented  Airway & Oxygen Therapy: Patient Spontanous Breathing and Patient connected to face mask oxygen  Post-op Assessment: Report given to RN and Post -op Vital signs reviewed and stable  Post vital signs: Reviewed and stable  Last Vitals:  Vitals Value Taken Time  BP 139/80 12/16/19 1231  Temp    Pulse 62 12/16/19 1233  Resp 12 12/16/19 1233  SpO2 100 % 12/16/19 1233  Vitals shown include unvalidated device data.  Last Pain:  Vitals:   12/16/19 1231  TempSrc:   PainSc: Asleep         Complications: No apparent anesthesia complications

## 2019-12-16 NOTE — Discharge Instructions (Signed)

## 2019-12-16 NOTE — H&P (Signed)
[facility] History and Physical   Primary Care Physician:  Shanon Rosser, PA-C   Reason for Procedure:  Colorectal cancer screening Plan:    Colonoscopy with possible intervention as needed     HPI: Faye Doar is a 54 y.o. male with h/o CAD, STEMI and ischemic cardiomyopathy (LVEF 30-35%) here for colonoscopy for colorectal cancer screening. Denies any nausea, vomiting, abdominal pain, melena or bright red blood per rectum    Past Medical History:  Diagnosis Date  . Acute right ventricular myocardial infarction ([place]) 09/21/2016  . Cardiogenic shock ([place]) 09/21/2016  . Chronic combined systolic and diastolic heart failure ([place]) 09/21/2016  . Coronary artery disease involving native coronary artery of native heart without angina pectoris 09/21/2016   a - Inf and RV infarct (STEMI) 1/18: LHC - pLAD, oD1 80, pRI 70, oLCx 30, mLCx 70, RCA 100, EF 45-50 >> PCI:  2.5 x 20 mm Promus DES to pRCA // Staged PCI 09/22/16: 3 x 38 mm Promus DES to mLAD; Attempted POBA to oD2 (80 >> 80); 3 x 28 mm Promus DES to RI  . Enlarged prostate   . History of ST elevation myocardial infarction (STEMI) 09/21/2016  . Hyperlipidemia 09/29/2016  . Hyperlipidemia 09/29/2016  . Hypertension   . Ischemic cardiomyopathy 09/23/2016   a - s/p Inf and RV infarct (STEMI) 1/18 // b - Echo 09/23/16: EF 35-40, inf-septal HK, apical septal and apical AK, ant and inf AK, ant-septal AK, Gr 1 diastolic dysfunction // c -Echo 5/18: mild conc LVH, EF 35-40, Gr 1 DD, inf-septal, ant-septal, ant, apical AK  . Noncompliance with medication regimen   . Presence of drug coated stent in right coronary artery 09/21/2016  . S/P angioplasty with stent, LAD and Ramus with DES 09/22/16 09/23/2016  . STEMI involving oth coronary artery of inferior wall ([place]) 09/21/2016    Past Surgical History:  Procedure Laterality Date  . CARDIAC CATHETERIZATION N/A 09/21/2016   Procedure: Left Heart Cath and Coronary Angiography;  Surgeon: Sherren Mocha, MD;  Location: [facility];  Service: Cardiovascular;  Laterality: N/A;  . CARDIAC CATHETERIZATION N/A 09/21/2016   Procedure: Coronary Stent Intervention;  Surgeon: Sherren Mocha, MD;  Location: [facility] [place] [facility];  Service: Cardiovascular;  Laterality: N/A;  Prox RCA  . CARDIAC CATHETERIZATION N/A 09/22/2016   Procedure: Coronary CTO Intervention;  Surgeon: Burnell Blanks, MD;  Location: [facility];  Service: Cardiovascular;  Laterality: N/A;    Prior to Admission medications   Medication Sig Start Date End Date Taking? Authorizing Provider  aspirin EC 81 MG EC tablet Take 1 tablet (81 mg total) by mouth daily. 09/23/16  Yes Isaiah Serge, NP  nitroGLYCERIN (NITROSTAT) 0.4 MG SL tablet PLACE 1 TABLET UNDER TONGUE EVERY 5 MINUTES FOR 3 DOSES AS NEEDED FOR CHEST PAIN 01/09/17  Yes [name] T, PA-C  atorvastatin (LIPITOR) 80 MG tablet Take 1 tablet (80 mg total) by mouth every evening. 12/10/19   Sherren Mocha, MD  carvedilol (COREG) 6.25 MG tablet Take 1 tablet (6.25 mg total) by mouth in the morning and at bedtime. 12/10/19   Sherren Mocha, MD  famotidine (PEPCID) 20 MG tablet Take 20 mg by mouth daily.    [provider]  losartan (COZAAR) 50 MG tablet Take 1 tablet (50 mg total) by mouth daily. 12/10/19 12/04/20  Sherren Mocha, MD    No current facility-administered medications for this encounter.    Allergies as of 11/15/2019  . (No Known  Allergies)    Family History  Problem Relation Age of Onset  . Other Mother        chest pains  . Heart attack Father   . Colon cancer Neg Hx   . Liver cancer Neg Hx   . Stomach cancer Neg Hx   . Rectal cancer Neg Hx   . Esophageal cancer Neg Hx     Social History   Socioeconomic History  . Marital status: Married    Spouse name: Not on file  . Number of children: 4  . Years of education: Not on file  . Highest education level: Not on file  Occupational History  . Occupation: [employer]  Tobacco Use  . Smoking status: Never Smoker  . Smokeless tobacco: Never Used  Substance and Sexual Activity  . Alcohol use: No  . Drug use: No  . Sexual activity: Not on file  Other Topics Concern  . Not on file  Social History Narrative  . Not on file   Social Determinants of Health   Financial Resource Strain:   . Difficulty of Paying Living Expenses:   Food Insecurity:   . Worried About Charity fundraiser in the Last Year:   . Arboriculturist in the Last Year:   Transportation Needs:   . Film/video editor (Medical):   Marland Kitchen Lack of Transportation (Non-Medical):   Physical Activity:   . Days of Exercise per Week:   . Minutes of Exercise per Session:   Stress:   . Feeling of Stress :   Social Connections:   . Frequency of Communication with Friends and Family:   . Frequency of Social Gatherings with Friends and Family:   . Attends Religious Services:   . Active Member of Clubs or Organizations:   . Attends Archivist Meetings:   Marland Kitchen Marital Status:   Intimate Partner Violence:   . Fear of Current or Ex-Partner:   . Emotionally Abused:   Marland Kitchen Physically Abused:   . Sexually Abused:     Review of Systems:  All other review of systems negative except as mentioned in the HPI.  Physical Exam: Vital signs in last 24 hours:     General:   Alert,  pleasant and cooperative in NAD Lungs:  Clear throughout to auscultation.   Heart:  Regular rate and rhythm Abdomen:  Soft, nontender and nondistended. Normal bowel sounds.   Neuro/Psych:  Alert and cooperative. Normal mood and affect. A and O x 3   K. Denzil Magnuson , MD [phone]

## 2019-12-16 NOTE — Anesthesia Preprocedure Evaluation (Addendum)
Anesthesia Evaluation  Patient identified by MRN, date of birth, ID band Patient awake    Reviewed: Allergy & Precautions, NPO status , Patient's Chart, lab work & pertinent test results  Airway Mallampati: II  TM Distance: >3 FB     Dental   Pulmonary    breath sounds clear to auscultation       Cardiovascular hypertension, + CAD and + Past MI   Rhythm:Regular Rate:Normal     Neuro/Psych    GI/Hepatic Neg liver ROS, History noted CG   Endo/Other  negative endocrine ROS  Renal/GU negative Renal ROS     Musculoskeletal   Abdominal   Peds  Hematology   Anesthesia Other Findings   Reproductive/Obstetrics                             Anesthesia Physical Anesthesia Plan  ASA: III  Anesthesia Plan: MAC   Post-op Pain Management:    Induction: Intravenous  PONV Risk Score and Plan: 2 and Ondansetron and Treatment may vary due to age or medical condition  Airway Management Planned: Nasal Cannula and Simple Face Mask  Additional Equipment:   Intra-op Plan:   Post-operative Plan:   Informed Consent: I have reviewed the patients History and Physical, chart, labs and discussed the procedure including the risks, benefits and alternatives for the proposed anesthesia with the patient or authorized representative who has indicated his/her understanding and acceptance.     Dental advisory given  Plan Discussed with: CRNA and Anesthesiologist  Anesthesia Plan Comments:        Anesthesia Quick Evaluation

## 2019-12-17 ENCOUNTER — Encounter: Payer: Self-pay | Admitting: Gastroenterology

## 2019-12-17 LAB — SURGICAL PATHOLOGY

## 2019-12-18 ENCOUNTER — Encounter: Payer: Self-pay | Admitting: *Deleted

## 2019-12-19 NOTE — Progress Notes (Signed)
Cardiology Office Note:    Date:  12/20/2019   ID:  Randy Moore, DOB [birth year], MRN [id number]  PCP:  Shanon Rosser, PA-C  Cardiologist:  Sherren Mocha, MD   Electrophysiologist:  None   Referring MD: Shanon Rosser, PA-C   Chief Complaint:  Follow-up (CHF, CAD, HTN)    Patient Profile:    Randy Moore is a 54 y.o. male with:   Coronary artery disease  ? S/p inferior MI (RV infarct) 08/2016 c/b hypotension/bradycardia >>PCI: DES to RCA ? Staged PCI 08/2016: DES to mid LAD and DES to RI; POBA to D2 unsuccessful ? Myoview 01/2019: EF 26, large anterior scar, no ischemia  Ischemic CM  Combined systolic and diastolic CHF ? Echocardiogram 2/19: EF 35-40 ? Echo 01/2019: EF 30-35, GR 1 DD  Hyperlipidemia  Hypertension   Medication nonadherence  Prior CV studies: Echocardiogram 02/18/2019 EF 30-35, Gr 1 DD, ant-sept and apical AK  Myoview 02/01/2019 EF 26, no ischemia, Lg scar (ant, ant-sept, inf, inf-sept, apical); High Risk  Echo 09/26/17 EF 35-40, ant-sept, ant, apical AK, Gr 1 DD  Echo 12/23/16 Mild conc LVH, EF 35-40, Gr 1 DD, inf-sept, ant-sept, ant, apical sept and ant AK  Echo 09/23/16 EF 35-40, inf-septal HK, apical septal and apical AK, ant and inf AK, ant-septal AK, Gr 1 diastolic dysfunction   PCI 09/22/16 3 x 38 mm Promus DES to mLAD Attempted POBA to oD2 (80 >> 80) 3 x 28 mm Promus DES to RI  LHC 09/21/16 LAD prox 99, ostial D1 80 RI prox 70 LCx ost 30, mid 70 RCA 100 EF 45-50 PCI: 2.5 x 20 mm Promus DES to pRCA  History of Present Illness:    Randy Moore was last seen 12/04/2019.  He was only taking Carvedilol once daily and was again off of his Losartan.  I adjusted his medications and he returns for follow up.  He returns for follow-up.  He is here alone.  He is doing well without chest pain, shortness of breath, syncope, orthopnea, lower extremity swelling.  He is taking his carvedilol and losartan correctly.  He has not been checking his blood  pressure at home.  Past Medical History:  Diagnosis Date  . Acute right ventricular myocardial infarction ([place]) 09/21/2016  . Cardiogenic shock ([place]) 09/21/2016  . Chronic combined systolic and diastolic heart failure ([place]) 09/21/2016  . Coronary artery disease involving native coronary artery of native heart without angina pectoris 09/21/2016   a - Inf and RV infarct (STEMI) 1/18: LHC - pLAD, oD1 80, pRI 70, oLCx 30, mLCx 70, RCA 100, EF 45-50 >> PCI:  2.5 x 20 mm Promus DES to pRCA // Staged PCI 09/22/16: 3 x 38 mm Promus DES to mLAD; Attempted POBA to oD2 (80 >> 80); 3 x 28 mm Promus DES to RI  . Enlarged prostate   . History of ST elevation myocardial infarction (STEMI) 09/21/2016  . Hyperlipidemia 09/29/2016  . Hyperlipidemia 09/29/2016  . Hypertension   . Ischemic cardiomyopathy 09/23/2016   a - s/p Inf and RV infarct (STEMI) 1/18 // b - Echo 09/23/16: EF 35-40, inf-septal HK, apical septal and apical AK, ant and inf AK, ant-septal AK, Gr 1 diastolic dysfunction // c -Echo 5/18: mild conc LVH, EF 35-40, Gr 1 DD, inf-septal, ant-septal, ant, apical AK  . Noncompliance with medication regimen   . Presence of drug coated stent in right coronary artery 09/21/2016  . S/P angioplasty with stent, LAD and Ramus with DES 09/22/16 09/23/2016  .  STEMI involving oth coronary artery of inferior wall ([place]) 09/21/2016    Current Medications: Current Meds  Medication Sig  . aspirin EC 81 MG EC tablet Take 1 tablet (81 mg total) by mouth daily.  Marland Kitchen atorvastatin (LIPITOR) 80 MG tablet Take 1 tablet (80 mg total) by mouth every evening.  . carvedilol (COREG) 6.25 MG tablet Take 1 tablet (6.25 mg total) by mouth in the morning and at bedtime.  . famotidine (PEPCID) 20 MG tablet Take 20 mg by mouth daily.  Marland Kitchen losartan (COZAAR) 50 MG tablet Take 1 tablet (50 mg total) by mouth daily.  . nitroGLYCERIN (NITROSTAT) 0.4 MG SL tablet PLACE 1 TABLET UNDER TONGUE EVERY 5 MINUTES FOR 3 DOSES AS NEEDED FOR CHEST PAIN      Allergies:   Patient has no known allergies.   Social History   Tobacco Use  . Smoking status: Never Smoker  . Smokeless tobacco: Never Used  Substance Use Topics  . Alcohol use: No  . Drug use: No     Family Hx: The patient's family history includes Heart attack in his father; Other in his mother. There is no history of Colon cancer, Liver cancer, Stomach cancer, Rectal cancer, or Esophageal cancer.  ROS   EKGs/Labs/Other Test Reviewed:    EKG:  EKG is not ordered today.  The ekg ordered today demonstrates n/a  Recent Labs: 05/06/2019: ALT 19   Recent Lipid Panel Lab Results  Component Value Date/Time   CHOL 176 05/06/2019 04:30 PM   TRIG 102 05/06/2019 04:30 PM   HDL 57 05/06/2019 04:30 PM   CHOLHDL 3.1 05/06/2019 04:30 PM   CHOLHDL 3.6 09/21/2016 03:46 AM   LDLCALC 101 (H) 05/06/2019 04:30 PM    Physical Exam:    VS:  BP 120/84   Pulse 69   Ht '5\' 8"'  (1.727 m)   Wt 177 lb (80.3 kg)   SpO2 97%   BMI 26.91 kg/m     Wt Readings from Last 3 Encounters:  12/20/19 177 lb (80.3 kg)  12/16/19 174 lb (78.9 kg)  12/04/19 168 lb 9.6 oz (76.5 kg)     Constitutional:      Appearance: Healthy appearance. Not in distress.  Neck:     Thyroid: No thyromegaly.     Vascular: JVD normal.  Pulmonary:     Breath sounds: No wheezing. No rales.  Cardiovascular:     Normal rate. Regular rhythm. Normal S1. Normal S2.     Murmurs: There is no murmur.  Edema:    Peripheral edema absent.  Abdominal:     Palpations: Abdomen is soft. There is no hepatomegaly.  Skin:    General: Skin is warm and dry.  Neurological:     General: No focal deficit present.     Mental Status: Alert and oriented to person, place and time.      ASSESSMENT & PLAN:    1. Chronic combined systolic and diastolic heart failure (HCC) EF 30-35 by echocardiogram June 2020.  Ischemic cardiomyopathy.  NYHA I-II.  Blood pressure is much better controlled on carvedilol and losartan.  I would like to  have him monitor his blood pressure the next couple weeks before we decide to place him on spironolactone.  At this point given class I symptoms and well-controlled blood pressure, I am not certain that switching him to [name] is needed.  I will determine whether or not to start spironolactone in the next couple weeks.  I will also plan on  obtaining an echocardiogram prior to his next visit.  He can follow-up with me in 3 months.  2. Essential hypertension Blood pressure much better controlled.  Continue current dose of carvedilol, losartan.  Obtain c-Met in 2 weeks.  He will monitor his blood pressure.  I will determine whether or not to start spironolactone at that time.  3. Coronary artery disease involving native coronary artery of native heart without angina pectoris History of inferior MI in January 2018 treated with drug-eluting stent to the RCA and staged PCI with drug-eluting stent to the LAD and drug-eluting stent to the ramus intermedius.  Balloon angioplasty of the second diagonal was unsuccessful.  He is not having angina.  Continue aspirin, atorvastatin, carvedilol.  4. Mixed hyperlipidemia Continue high intensity statin therapy.  Arrange fasting c-Met, lipids in 2 weeks.    Dispo:  Return in about 3 months (around 03/20/2020) for Routine Follow Up, w/ [name], PA-C, in person.   Medication Adjustments/Labs and Tests Ordered: Current medicines are reviewed at length with the patient today.  Concerns regarding medicines are outlined above.  Tests Ordered: Orders Placed This Encounter  Procedures  . Comprehensive metabolic panel  . Lipid panel   Medication Changes: No orders of the defined types were placed in this encounter.   Signed, [name], PA-C  12/20/2019 11:02 AM    [facility] [address], [place], [place]  [address] Phone: [phone]; Fax: [phone]

## 2019-12-20 ENCOUNTER — Encounter: Payer: Self-pay | Admitting: Physician Assistant

## 2019-12-20 ENCOUNTER — Ambulatory Visit: Payer: Managed Care, Other (non HMO) | Admitting: Physician Assistant

## 2019-12-20 ENCOUNTER — Other Ambulatory Visit: Payer: Self-pay

## 2019-12-20 VITALS — BP 120/84 | HR 69 | Ht 68.0 in | Wt 177.0 lb

## 2019-12-20 DIAGNOSIS — I2510 Atherosclerotic heart disease of native coronary artery without angina pectoris: Secondary | ICD-10-CM

## 2019-12-20 DIAGNOSIS — E782 Mixed hyperlipidemia: Secondary | ICD-10-CM | POA: Diagnosis not present

## 2019-12-20 DIAGNOSIS — I10 Essential (primary) hypertension: Secondary | ICD-10-CM

## 2019-12-20 DIAGNOSIS — I5042 Chronic combined systolic (congestive) and diastolic (congestive) heart failure: Secondary | ICD-10-CM | POA: Diagnosis not present

## 2019-12-20 NOTE — Patient Instructions (Addendum)
Medication Instructions:   Your physician recommends that you continue on your current medications as directed. Please refer to the Current Medication list given to you today.  *If you need a refill on your cardiac medications before your next appointment, please call your pharmacy*  Lab Work:  Your physician recommends that you return for lab work in 2 weeks on 01/03/20  If you have labs (blood work) drawn today and your tests are completely normal, you will receive your results only by: Marland Kitchen MyChart Message (if you have MyChart) OR . A paper copy in the mail If you have any lab test that is abnormal or we need to change your treatment, we will call you to review the results.  Testing/Procedures:  None ordered today  Follow-Up: At [facility], you and your health needs are our priority.  As part of our continuing mission to provide you with exceptional heart care, we have created designated Provider Care Teams.  These Care Teams include your primary Cardiologist (physician) and Advanced Practice Providers (APPs -  Physician Assistants and Nurse Practitioners) who all work together to provide you with the care you need, when you need it.  We recommend signing up for the patient portal called "MyChart".  Sign up information is provided on this After Visit Summary.  MyChart is used to connect with patients for Virtual Visits (Telemedicine).  Patients are able to view lab/test results, encounter notes, upcoming appointments, etc.  Non-urgent messages can be sent to your provider as well.   To learn more about what you can do with MyChart, go to [email or web address].    Your next appointment:    On 03/23/20 at 3:45PM with [name], PA-C  Other Instructions  Check blood pressure once a day for 2 weeks and bring blood pressure readings to lab appointment.

## 2020-01-03 ENCOUNTER — Other Ambulatory Visit: Payer: Self-pay

## 2020-01-03 ENCOUNTER — Ambulatory Visit (INDEPENDENT_AMBULATORY_CARE_PROVIDER_SITE_OTHER): Payer: Managed Care, Other (non HMO)

## 2020-01-03 ENCOUNTER — Other Ambulatory Visit: Payer: Managed Care, Other (non HMO) | Admitting: *Deleted

## 2020-01-03 ENCOUNTER — Ambulatory Visit: Payer: Managed Care, Other (non HMO) | Admitting: Podiatry

## 2020-01-03 DIAGNOSIS — S93692A Other sprain of left foot, initial encounter: Secondary | ICD-10-CM | POA: Diagnosis not present

## 2020-01-03 DIAGNOSIS — I5042 Chronic combined systolic (congestive) and diastolic (congestive) heart failure: Secondary | ICD-10-CM

## 2020-01-03 DIAGNOSIS — M722 Plantar fascial fibromatosis: Secondary | ICD-10-CM

## 2020-01-03 DIAGNOSIS — I10 Essential (primary) hypertension: Secondary | ICD-10-CM

## 2020-01-03 DIAGNOSIS — M79672 Pain in left foot: Secondary | ICD-10-CM

## 2020-01-03 DIAGNOSIS — I2510 Atherosclerotic heart disease of native coronary artery without angina pectoris: Secondary | ICD-10-CM

## 2020-01-03 DIAGNOSIS — E782 Mixed hyperlipidemia: Secondary | ICD-10-CM

## 2020-01-03 LAB — COMPREHENSIVE METABOLIC PANEL
ALT: 38 IU/L (ref 0–44)
AST: 27 IU/L (ref 0–40)
Albumin/Globulin Ratio: 1.8 (ref 1.2–2.2)
Albumin: 4.2 g/dL (ref 3.8–4.9)
Alkaline Phosphatase: 72 IU/L (ref 39–117)
BUN/Creatinine Ratio: 11 (ref 9–20)
BUN: 11 mg/dL (ref 6–24)
Bilirubin Total: 1.3 mg/dL — ABNORMAL HIGH (ref 0.0–1.2)
CO2: 26 mmol/L (ref 20–29)
Calcium: 9.4 mg/dL (ref 8.7–10.2)
Chloride: 101 mmol/L (ref 96–106)
Creatinine, Ser: 1.02 mg/dL (ref 0.76–1.27)
GFR calc Af Amer: 95 mL/min/{1.73_m2} (ref >59)
GFR calc non Af Amer: 82 mL/min/{1.73_m2} (ref >59)
Globulin, Total: 2.4 g/dL (ref 1.5–4.5)
Glucose: 89 mg/dL (ref 65–99)
Potassium: 3.8 mmol/L (ref 3.5–5.2)
Sodium: 140 mmol/L (ref 134–144)
Total Protein: 6.6 g/dL (ref 6.0–8.5)

## 2020-01-03 LAB — LIPID PANEL
Chol/HDL Ratio: 2.4 ratio (ref 0.0–5.0)
Cholesterol, Total: 143 mg/dL (ref 100–199)
HDL: 59 mg/dL (ref >39)
LDL Chol Calc (NIH): 73 mg/dL (ref 0–99)
Triglycerides: 52 mg/dL (ref 0–149)
VLDL Cholesterol Cal: 11 mg/dL (ref 5–40)

## 2020-01-03 MED ORDER — MELOXICAM 7.5 MG PO TABS
7.5000 mg | ORAL_TABLET | Freq: Every day | ORAL | 0 refills | Status: DC
Start: 2020-01-03 — End: 2020-04-09

## 2020-01-03 NOTE — Progress Notes (Signed)
  Subjective:  Patient ID: Randy Moore, male    DOB: [birth year],  MRN: [id number]  Chief Complaint  Patient presents with  . Foot Injury    Pt states sports related injury (soccer), experienced severe and sudden pain in his plantar heel "I heard a noise and then collapsed with pain". Pt states pain even at rest.    54 y.o. male presents with the above complaint. History confirmed with patient.   Objective:  Physical Exam: warm, good capillary refill, no trophic changes or ulcerative lesions, normal DP and PT pulses and normal sensory exam. Left Foot: POP left medial calc tuber  Right Foot: normal exam, no swelling, tenderness, instability; ligaments intact, full range of motion of all ankle/foot joints   No images are attached to the encounter.  Radiographs: X-ray of the left foot: no fracture, dislocation, swelling or degenerative changes noted Assessment:   1. Plantar fasciitis   2. Rupture of plantar fascia of left foot, initial encounter     Plan:  Patient was evaluated and treated and all questions answered.  Plantar fasciitis left, possible tear -Trial CAM boot for 2-3 weeks -Rx meloxicam -F/u in 3 weeks for recheck. Consider injection at that time.  Return in about 3 weeks (around 01/24/2020) for Plantar fasciitis.

## 2020-01-07 ENCOUNTER — Other Ambulatory Visit: Payer: Self-pay | Admitting: Podiatry

## 2020-01-07 DIAGNOSIS — S93692A Other sprain of left foot, initial encounter: Secondary | ICD-10-CM

## 2020-01-08 ENCOUNTER — Telehealth: Payer: Self-pay

## 2020-01-08 DIAGNOSIS — E7800 Pure hypercholesterolemia, unspecified: Secondary | ICD-10-CM

## 2020-01-08 DIAGNOSIS — E782 Mixed hyperlipidemia: Secondary | ICD-10-CM

## 2020-01-08 DIAGNOSIS — I2510 Atherosclerotic heart disease of native coronary artery without angina pectoris: Secondary | ICD-10-CM

## 2020-01-08 MED ORDER — EZETIMIBE 10 MG PO TABS
10.0000 mg | ORAL_TABLET | Freq: Every day | ORAL | 3 refills | Status: DC
Start: 2020-01-08 — End: 2020-05-22

## 2020-01-08 NOTE — Telephone Encounter (Signed)
-----   Message from Liliane Shi, [place] sent at 01/03/2020  4:48 PM EDT ----- Creatinine, K+, LFTs normal.  Lipids look good but LDL should be < 70.   PLAN:   - Start Zetia 10 mg once daily  - Continue all other medications   - Fasting Lipids and LFTs in 3 mos.  [name], PA-C    01/03/2020 4:48 PM

## 2020-01-08 NOTE — Telephone Encounter (Signed)
The patient has been notified of the result and verbalized understanding.  All questions (if any) were answered. Mady Haagensen, [place] 01/08/2020 10:01 AM

## 2020-01-24 ENCOUNTER — Other Ambulatory Visit: Payer: Self-pay

## 2020-01-24 ENCOUNTER — Ambulatory Visit: Payer: Managed Care, Other (non HMO) | Admitting: Podiatry

## 2020-01-24 VITALS — Temp 97.4°F

## 2020-01-24 DIAGNOSIS — M722 Plantar fascial fibromatosis: Secondary | ICD-10-CM

## 2020-01-24 DIAGNOSIS — S93692D Other sprain of left foot, subsequent encounter: Secondary | ICD-10-CM | POA: Diagnosis not present

## 2020-01-24 NOTE — Progress Notes (Signed)
  Subjective:  Patient ID: Randy Moore, male    DOB: [birth year],  MRN: [id number]  Chief Complaint  Patient presents with  . Follow-up    L plantar fasciitis. Pt stated, "The pain has improved - 3/10 most of the time. Wearing the cam boot and taking Meloxicam".   54 y.o. male presents with the above complaint. History confirmed with patient.   Objective:  Physical Exam: warm, good capillary refill, no trophic changes or ulcerative lesions, normal DP and PT pulses and normal sensory exam. Left Foot: mild pain left medial calcaneus at the plantar fascia. Right Foot: normal exam, no swelling, tenderness, instability; ligaments intact, full range of motion of all ankle/foot joints   Assessment:   1. Plantar fasciitis   2. Rupture of plantar fascia of left foot, subsequent encounter     Plan:  Patient was evaluated and treated and all questions answered.  Plantar fasciitis left, possible tear -Much improved, no sharp pain at plantar fascia -Dispense plantar fascial relief brace. Wear for approx 2 weeks. -F/u should pain persist.  No follow-ups on file.

## 2020-03-23 ENCOUNTER — Ambulatory Visit: Payer: Managed Care, Other (non HMO) | Admitting: Physician Assistant

## 2020-03-23 NOTE — Progress Notes (Deleted)
°Cardiology Office Note:   ° °Date:  03/23/2020  ° °ID:  Randy Moore, DOB 04/30/1966, MRN [id number] ° °PCP:  Long, Scott, PA-C  °Cardiologist:  Michael Cooper, MD *** °Electrophysiologist:  None  ° °Referring MD: Long, Scott, PA-C  ° °Chief Complaint:  No chief complaint on file. °  ° °Patient Profile:   ° °Randy Moore is a 54 y.o. male with:  °· Coronary artery disease  °? S/p inferior MI (RV infarct) 08/2016 c/b hypotension/bradycardia >> PCI:  DES to RCA °? Staged PCI 08/2016:  DES to mid LAD and DES to RI; POBA to D2 unsuccessful °? Myoview 01/2019: EF 26, large anterior scar, no ischemia °· Ischemic CM °· Combined systolic and diastolic CHF °? Echocardiogram 2/19: EF 35-40 °? Echo 01/2019: EF 30-35, GR 1 DD °· Hyperlipidemia  °· Hypertension  °· Medication nonadherence °  °Prior CV studies: °Echocardiogram 02/18/2019 °EF 30-35, Gr 1 DD, ant-sept and apical AK °  °Myoview 02/01/2019 °EF 26, no ischemia, Lg scar (ant, ant-sept, inf, inf-sept, apical); High Risk °  °Echo 09/26/17 °EF 35-40, ant-sept, ant, apical AK, Gr 1 DD °  °Echo 12/23/16 °Mild conc LVH, EF 35-40, Gr 1 DD, inf-sept, ant-sept, ant, apical sept and ant AK °  °Echo 09/23/16 °EF 35-40, inf-septal HK, apical septal and apical AK, ant and inf AK, ant-septal AK, Gr 1 diastolic dysfunction  °  °PCI 09/22/16 °3 x 38 mm Promus DES to mLAD °Attempted POBA to oD2 (80 >> 80) °3 x 28 mm Promus DES to RI °  °LHC 09/21/16 °LAD prox 99, ostial D1 80 °RI prox 70 °LCx ost 30, mid 70 °RCA 100 °EF 45-50 °PCI: 2.5 x 20 mm Promus DES to pRCA ° °History of Present Illness:   ° °Mr. Haddon was last seen in 11/2019.  He had previously been taking his BP medications incorrectly and his pressure had been out of control.  When I last saw him, he was taking them correctly and his BP was optimal.  He returns for follow up.  The DICTATELATER SmartLink is not supported in this context. ***  ° °Past Medical History:  °Diagnosis Date  °• Acute right ventricular myocardial infarction (HCC)  09/21/2016  °• Cardiogenic shock (HCC) 09/21/2016  °• Chronic combined systolic and diastolic heart failure (HCC) 09/21/2016  °• Coronary artery disease involving native coronary artery of native heart without angina pectoris 09/21/2016  ° a - Inf and RV infarct (STEMI) 1/18: LHC - pLAD, oD1 80, pRI 70, oLCx 30, mLCx 70, RCA 100, EF 45-50 >> PCI:  2.5 x 20 mm Promus DES to pRCA // Staged PCI 09/22/16: 3 x 38 mm Promus DES to mLAD; Attempted POBA to oD2 (80 >> 80); 3 x 28 mm Promus DES to RI  °• Enlarged prostate   °• History of ST elevation myocardial infarction (STEMI) 09/21/2016  °• Hyperlipidemia 09/29/2016  °• Hyperlipidemia 09/29/2016  °• Hypertension   °• Ischemic cardiomyopathy 09/23/2016  ° a - s/p Inf and RV infarct (STEMI) 1/18 // b - Echo 09/23/16: EF 35-40, inf-septal HK, apical septal and apical AK, ant and inf AK, ant-septal AK, Gr 1 diastolic dysfunction // c -Echo 5/18: mild conc LVH, EF 35-40, Gr 1 DD, inf-septal, ant-septal, ant, apical AK  °• Noncompliance with medication regimen   °• Presence of drug coated stent in right coronary artery 09/21/2016  °• S/P angioplasty with stent, LAD and Ramus with DES 09/22/16 09/23/2016  °• STEMI involving oth coronary artery of inferior wall (HCC) 09/21/2016  ° ° °Current Medications: °No outpatient medications   have been marked as taking for the 03/23/20 encounter (Appointment) with Weaver, Scott T, PA-C.  °  ° °Allergies:   Patient has no known allergies.  ° °Social History  ° °Tobacco Use  °• Smoking status: Never Smoker  °• Smokeless tobacco: Never Used  °Vaping Use  °• Vaping Use: Never used  °Substance Use Topics  °• Alcohol use: No  °• Drug use: No  °  ° °Family Hx: °The patient's family history includes Heart attack in his father; Other in his mother. There is no history of Colon cancer, Liver cancer, Stomach cancer, Rectal cancer, or Esophageal cancer. ° °ROS  ° °EKGs/Labs/Other Test Reviewed:   ° °EKG:  EKG is *** ordered today.  The ekg ordered today demonstrates  *** ° °Recent Labs: °01/03/2020: ALT 38; BUN 11; Creatinine, Ser 1.02; Potassium 3.8; Sodium 140  ° °Recent Lipid Panel °Lab Results  °Component Value Date/Time  ° CHOL 143 01/03/2020 11:45 AM  ° TRIG 52 01/03/2020 11:45 AM  ° HDL 59 01/03/2020 11:45 AM  ° CHOLHDL 2.4 01/03/2020 11:45 AM  ° CHOLHDL 3.6 09/21/2016 03:46 AM  ° LDLCALC 73 01/03/2020 11:45 AM  ° ° °Physical Exam:   ° °VS:  There were no vitals taken for this visit.   ° °Wt Readings from Last 3 Encounters:  °12/20/19 177 lb (80.3 kg)  °12/16/19 174 lb (78.9 kg)  °12/04/19 168 lb 9.6 oz (76.5 kg)  °  ° °Physical Exam *** ° °ASSESSMENT & PLAN:   ° °*** °1. Chronic combined systolic and diastolic heart failure (HCC) °EF 30-35 by echocardiogram June 2020.  Ischemic cardiomyopathy.  NYHA I-II.  Blood pressure is much better controlled on carvedilol and losartan.  I would like to have him monitor his blood pressure the next couple weeks before we decide to place him on spironolactone.  At this point given class I symptoms and well-controlled blood pressure, I am not certain that switching him to Entresto is needed.  I will determine whether or not to start spironolactone in the next couple weeks.  I will also plan on obtaining an echocardiogram prior to his next visit.  He can follow-up with me in 3 months. °  °2. Essential hypertension °Blood pressure much better controlled.  Continue current dose of carvedilol, losartan.  Obtain c-Met in 2 weeks.  He will monitor his blood pressure.  I will determine whether or not to start spironolactone at that time. °  °3. Coronary artery disease involving native coronary artery of native heart without angina pectoris °History of inferior MI in January 2018 treated with drug-eluting stent to the RCA and staged PCI with drug-eluting stent to the LAD and drug-eluting stent to the ramus intermedius.  Balloon angioplasty of the second diagonal was unsuccessful.  He is not having angina.  Continue aspirin, atorvastatin,  carvedilol. °  °4. Mixed hyperlipidemia °Continue high intensity statin therapy.  Arrange fasting c-Met, lipids in 2 weeks. ° °Dispo:  No follow-ups on file.  ° °Medication Adjustments/Labs and Tests Ordered: °Current medicines are reviewed at length with the patient today.  Concerns regarding medicines are outlined above.  °Tests Ordered: °No orders of the defined types were placed in this encounter. ° °Medication Changes: °No orders of the defined types were placed in this encounter. ° ° °Signed, °Scott Weaver, PA-C  °03/23/2020 1:57 PM    °[facility] °[address], [place], [place]  [address] °Phone: [phone]; Fax: [phone]  ° ° °

## 2020-04-08 ENCOUNTER — Other Ambulatory Visit: Payer: Self-pay | Admitting: Podiatry

## 2020-04-08 ENCOUNTER — Other Ambulatory Visit: Payer: Self-pay | Admitting: Cardiology

## 2020-04-10 ENCOUNTER — Other Ambulatory Visit: Payer: Managed Care, Other (non HMO) | Admitting: *Deleted

## 2020-04-10 ENCOUNTER — Other Ambulatory Visit: Payer: Self-pay

## 2020-04-10 DIAGNOSIS — E782 Mixed hyperlipidemia: Secondary | ICD-10-CM

## 2020-04-10 DIAGNOSIS — E7800 Pure hypercholesterolemia, unspecified: Secondary | ICD-10-CM

## 2020-04-10 DIAGNOSIS — I2510 Atherosclerotic heart disease of native coronary artery without angina pectoris: Secondary | ICD-10-CM

## 2020-04-10 LAB — LIPID PANEL
Chol/HDL Ratio: 2.4 ratio (ref 0.0–5.0)
Cholesterol, Total: 130 mg/dL (ref 100–199)
HDL: 54 mg/dL (ref >39)
LDL Chol Calc (NIH): 64 mg/dL (ref 0–99)
Triglycerides: 52 mg/dL (ref 0–149)
VLDL Cholesterol Cal: 12 mg/dL (ref 5–40)

## 2020-04-10 LAB — HEPATIC FUNCTION PANEL
ALT: 25 IU/L (ref 0–44)
AST: 25 IU/L (ref 0–40)
Albumin: 4.3 g/dL (ref 3.8–4.9)
Alkaline Phosphatase: 72 IU/L (ref 48–121)
Bilirubin Total: 0.8 mg/dL (ref 0.0–1.2)
Bilirubin, Direct: 0.23 mg/dL (ref 0.00–0.40)
Total Protein: 6.5 g/dL (ref 6.0–8.5)

## 2020-04-23 ENCOUNTER — Encounter: Payer: Self-pay | Admitting: Cardiovascular Disease

## 2020-04-23 ENCOUNTER — Ambulatory Visit: Payer: Managed Care, Other (non HMO) | Admitting: Cardiovascular Disease

## 2020-04-23 ENCOUNTER — Other Ambulatory Visit: Payer: Self-pay

## 2020-04-23 VITALS — BP 104/72 | HR 73 | Ht 68.0 in | Wt 181.8 lb

## 2020-04-23 DIAGNOSIS — I5042 Chronic combined systolic (congestive) and diastolic (congestive) heart failure: Secondary | ICD-10-CM

## 2020-04-23 DIAGNOSIS — I10 Essential (primary) hypertension: Secondary | ICD-10-CM

## 2020-04-23 DIAGNOSIS — I2510 Atherosclerotic heart disease of native coronary artery without angina pectoris: Secondary | ICD-10-CM

## 2020-04-23 DIAGNOSIS — E782 Mixed hyperlipidemia: Secondary | ICD-10-CM

## 2020-04-23 MED ORDER — SACUBITRIL-VALSARTAN 24-26 MG PO TABS: 1.0000 | ORAL_TABLET | Freq: Two times a day (BID) | ORAL | 0 refills | Status: DC

## 2020-04-23 NOTE — Patient Instructions (Signed)
Medication Instructions:  1) STOP LOSARTAN 2) START ENTRESTO 24-26 mg twice daily  *If you need a refill on your cardiac medications before your next appointment, please call your pharmacy*  Testing/Procedures: Your provider has requested that you have an echocardiogram. Echocardiography is a painless test that uses sound waves to create images of your heart. It provides your doctor with information about the size and shape of your heart and how well your heart's chambers and valves are working. This procedure takes approximately one hour. There are no restrictions for this procedure.  Follow-Up: Your provider recommends that you schedule a follow-up appointment in 2-3 weeks with the [facility].  Your provider recommends that you schedule a follow-up appointment in: 3 months with Randy Dopp, PA.

## 2020-04-23 NOTE — Progress Notes (Signed)
Cardiology Office Note:    Date:  04/23/2020   ID:  Randy Moore, DOB 16-May-1966, MRN [id number]  PCP:  Shanon Rosser, PA-C  [place] Cardiologist:  Sherren Mocha, MD  [place] Electrophysiologist:  None   Referring MD: Shanon Rosser, PA-C   Chief Complaint  Patient presents with  . Coronary Artery Disease    History of Present Illness:    Randy Moore is a 54 y.o. male with a hx of:  Coronary artery disease  ? S/p inferior MI (RV infarct) 08/2016 c/b hypotension/bradycardia >>PCI: DES to RCA ? Staged PCI 08/2016: DES to mid LAD and DES to RI; POBA to D2 unsuccessful ? Myoview 01/2019: EF 26, large anterior scar, no ischemia  Ischemic CM  Combined systolic and diastolic CHF ? Echocardiogram 2/19: EF 35-40 ? Echo 01/2019: EF 30-35, GR 1 DD  Hyperlipidemia  Medication nonadherence  The patient was last seen by [name] in April 2021.  He had been off of all of his medicines, which has been a recurring issue.  [name] got him back on medical therapy for untreated hypertension and he was seen back a few weeks later, doing well at that time.  He has been treated with carvedilol and losartan.  He is here alone today. He's noted shortness of breath when he's out in the hot weather. Otherwise doing well. No issues otherwise. No chest pain or pressure. No leg swelling, orthopnea, or PND.  The patient is able to play soccer and do other physical activities without significant limitation.  Past Medical History:  Diagnosis Date  . Acute right ventricular myocardial infarction ([place]) 09/21/2016  . Cardiogenic shock ([place]) 09/21/2016  . Chronic combined systolic and diastolic heart failure ([place]) 09/21/2016  . Coronary artery disease involving native coronary artery of native heart without angina pectoris 09/21/2016   a - Inf and RV infarct (STEMI) 1/18: LHC - pLAD, oD1 80, pRI 70, oLCx 30, mLCx 70, RCA 100, EF 45-50 >> PCI:  2.5 x 20 mm Promus DES to pRCA // Staged PCI 09/22/16: 3 x 38  mm Promus DES to mLAD; Attempted POBA to oD2 (80 >> 80); 3 x 28 mm Promus DES to RI  . Enlarged prostate   . History of ST elevation myocardial infarction (STEMI) 09/21/2016  . Hyperlipidemia 09/29/2016  . Hyperlipidemia 09/29/2016  . Hypertension   . Ischemic cardiomyopathy 09/23/2016   a - s/p Inf and RV infarct (STEMI) 1/18 // b - Echo 09/23/16: EF 35-40, inf-septal HK, apical septal and apical AK, ant and inf AK, ant-septal AK, Gr 1 diastolic dysfunction // c -Echo 5/18: mild conc LVH, EF 35-40, Gr 1 DD, inf-septal, ant-septal, ant, apical AK  . Noncompliance with medication regimen   . Presence of drug coated stent in right coronary artery 09/21/2016  . S/P angioplasty with stent, LAD and Ramus with DES 09/22/16 09/23/2016  . STEMI involving oth coronary artery of inferior wall ([place]) 09/21/2016    Past Surgical History:  Procedure Laterality Date  . CARDIAC CATHETERIZATION N/A 09/21/2016   Procedure: Left Heart Cath and Coronary Angiography;  Surgeon: Sherren Mocha, MD;  Location: [facility];  Service: Cardiovascular;  Laterality: N/A;  . CARDIAC CATHETERIZATION N/A 09/21/2016   Procedure: Coronary Stent Intervention;  Surgeon: Sherren Mocha, MD;  Location: [facility];  Service: Cardiovascular;  Laterality: N/A;  Prox RCA  . CARDIAC CATHETERIZATION N/A 09/22/2016   Procedure: Coronary CTO Intervention;  Surgeon: Burnell Blanks, MD;  Location: [facility]  INVASIVE CV LAB;  Service: Cardiovascular;  Laterality: N/A;  . COLONOSCOPY WITH PROPOFOL N/A 12/16/2019   Procedure: COLONOSCOPY WITH PROPOFOL;  Surgeon: Mauri Pole, MD;  Location: [facility];  Service: Endoscopy;  Laterality: N/A;  . POLYPECTOMY  12/16/2019   Procedure: POLYPECTOMY;  Surgeon: Mauri Pole, MD;  Location: [facility];  Service: Endoscopy;;    Current Medications: Current Meds  Medication Sig  . aspirin EC 81 MG EC tablet Take 1 tablet (81 mg total) by mouth daily.  Marland Kitchen atorvastatin (LIPITOR) 80  MG tablet Take 1 tablet (80 mg total) by mouth every evening.  . carvedilol (COREG) 6.25 MG tablet Take 1 tablet (6.25 mg total) by mouth in the morning and at bedtime.  . famotidine (PEPCID) 20 MG tablet TAKE 1 TABLET(20 MG) BY MOUTH TWICE DAILY  . levofloxacin (LEVAQUIN) 750 MG tablet Take 750 mg by mouth every morning.  Marland Kitchen losartan (COZAAR) 50 MG tablet Take 1 tablet (50 mg total) by mouth daily.  . meloxicam (MOBIC) 7.5 MG tablet TAKE 1 TABLET(7.5 MG) BY MOUTH DAILY  . nitroGLYCERIN (NITROSTAT) 0.4 MG SL tablet PLACE 1 TABLET UNDER TONGUE EVERY 5 MINUTES FOR 3 DOSES AS NEEDED FOR CHEST PAIN  . tamsulosin (FLOMAX) 0.4 MG CAPS capsule Take 0.4 mg by mouth at bedtime.     Allergies:   Patient has no known allergies.   Social History   Socioeconomic History  . Marital status: Married    Spouse name: Not on file  . Number of children: 4  . Years of education: Not on file  . Highest education level: Not on file  Occupational History  . Occupation: [employer]  Tobacco Use  . Smoking status: Never Smoker  . Smokeless tobacco: Never Used  Vaping Use  . Vaping Use: Never used  Substance and Sexual Activity  . Alcohol use: No  . Drug use: No  . Sexual activity: Not on file  Other Topics Concern  . Not on file  Social History Narrative  . Not on file   Social Determinants of Health   Financial Resource Strain:   . Difficulty of Paying Living Expenses: Not on file  Food Insecurity:   . Worried About Charity fundraiser in the Last Year: Not on file  . Ran Out of Food in the Last Year: Not on file  Transportation Needs:   . Lack of Transportation (Medical): Not on file  . Lack of Transportation (Non-Medical): Not on file  Physical Activity:   . Days of Exercise per Week: Not on file  . Minutes of Exercise per Session: Not on file  Stress:   . Feeling of Stress : Not on file  Social Connections:   . Frequency of Communication with Friends and Family: Not on file    . Frequency of Social Gatherings with Friends and Family: Not on file  . Attends Religious Services: Not on file  . Active Member of Clubs or Organizations: Not on file  . Attends Archivist Meetings: Not on file  . Marital Status: Not on file     Family History: The patient's family history includes Heart attack in his father; Other in his mother. There is no history of Colon cancer, Liver cancer, Stomach cancer, Rectal cancer, or Esophageal cancer.  ROS:   Please see the history of present illness.    All other systems reviewed and are negative.  EKGs/Labs/Other Studies Reviewed:    The following studies were reviewed  today: Echo 02-18-2019: IMPRESSIONS    1. The left ventricle has moderate-severely reduced systolic function,  with an ejection fraction of 30-35%. The cavity size was normal. Left  ventricular diastolic Doppler parameters are consistent with impaired  relaxation.  2. The right ventricle has normal systolic function. The cavity was  normal.  3. The tricuspid valve is grossly normal.  4. The aortic valve is tricuspid. No stenosis of the aortic valve.  5. Akinesis of the anteroseptal and apical walls with overall moderate to  severe LV dysfunction; mild diastolic dysfunction.  EKG:  EKG is ordered today.  The ekg ordered today demonstrates NSr 64 bpm, possible age-indeterminate inferolateral MI  Recent Labs: 01/03/2020: BUN 11; Creatinine, Ser 1.02; Potassium 3.8; Sodium 140 04/10/2020: ALT 25  Recent Lipid Panel    Component Value Date/Time   CHOL 130 04/10/2020 0925   TRIG 52 04/10/2020 0925   HDL 54 04/10/2020 0925   CHOLHDL 2.4 04/10/2020 0925   CHOLHDL 3.6 09/21/2016 0346   VLDL 6 09/21/2016 0346   LDLCALC 64 04/10/2020 0925    Physical Exam:    VS:  BP 104/72   Pulse 73   Ht 5\' 8"  (1.727 m)   Wt 181 lb 12.8 oz (82.5 kg)   SpO2 96%   BMI 27.64 kg/m     Wt Readings from Last 3 Encounters:  04/23/20 181 lb 12.8 oz (82.5 kg)   12/20/19 177 lb (80.3 kg)  12/16/19 174 lb (78.9 kg)     GEN:  Well nourished, well developed in no acute distress HEENT: Normal NECK: No JVD; No carotid bruits LYMPHATICS: No lymphadenopathy CARDIAC: RRR, no murmurs, rubs, gallops RESPIRATORY:  Clear to auscultation without rales, wheezing or rhonchi  ABDOMEN: Soft, non-tender, non-distended MUSCULOSKELETAL:  No edema; No deformity  SKIN: Warm and dry NEUROLOGIC:  Alert and oriented x 3 PSYCHIATRIC:  Normal affect   ASSESSMENT:    1. Chronic combined systolic and diastolic heart failure ([place])   2. Coronary artery disease involving native coronary artery of native heart without angina pectoris   3. Mixed hyperlipidemia   4. Essential hypertension    PLAN:    In order of problems listed above:  1. Last LVEF assessment was 30 to 35%.  He has not been inclined pursue ICD implantation when discussed in the past.  He has been on and off of medical therapy, but currently is compliant with his losartan and carvedilol.  I have recommended that he transition to Entresto 24/26 mg.  We will bring him back in a few weeks for follow-up labs and further medication titration in the pharmacy clinic.  Will update an echocardiogram as it has been greater than 1 year since his last study. 2. Stable without symptoms of angina.  Remains on aspirin, beta-blocker, and a high intensity statin drug. 3. Most recent lipids reviewed with LDL cholesterol at goal on a combination of Zetia and atorvastatin. 4. Blood pressure well controlled on current therapy.  Continue carvedilol at current dose.  Transition from losartan to [facility].   Medication Adjustments/Labs and Tests Ordered: Current medicines are reviewed at length with the patient today.  Concerns regarding medicines are outlined above.  No orders of the defined types were placed in this encounter.  No orders of the defined types were placed in this encounter.   There are no Patient Instructions  on file for this visit.   Signed, Sherren Mocha, MD  04/23/2020 10:29 AM    [facility]

## 2020-05-07 NOTE — Progress Notes (Incomplete)
Patient ID: Randy Moore                 DOB: Oct 14, 1965                      MRN: [id number]     HPI: Randy Moore is a 54 y.o. male referred by Dr. Burt Knack to pharmacy clinic for HF medication management. PMH is significant for CAD, CHF, HLD. Most recent LVEF 30-35% on 01/2019.  Patient was last seen with Dr. Burt Knack on 04/23/20. At that time, patient was switched from losartan to Entresto 24/26 mg BID.  Patient presents to HF clinic today for follow up and further medication titration. ***   -Tolerating Entresto? Dizziness, headache? -Home BP/HR? -Adherence (has history of non-adherence) -Fatigue, ADL -SOB, swelling, weight gain -Chest pain, palpitations -Exercise, diet -Office BP/HR -Plan: f/u labs today, then inc Entresto to 49/51 if normal and BP permits; otherwise, inc carvedilol to 12.5 mg BID     Current CHF meds: Entresto 24/26 mg twice daily. Carvedilol 6.25 mg twice daily. Previously tried: Losartan 25 mg daily (switched to Entresto) BP goal: <130/80  Family History: Heart attack (father)  Social History: Never smoker  Diet: ***  Exercise: ***  Home BP readings: ***  Wt Readings from Last 3 Encounters:  04/23/20 181 lb 12.8 oz (82.5 kg)  12/20/19 177 lb (80.3 kg)  12/16/19 174 lb (78.9 kg)   BP Readings from Last 3 Encounters:  04/23/20 104/72  12/20/19 120/84  12/16/19 139/80   Pulse Readings from Last 3 Encounters:  04/23/20 73  12/20/19 69  12/16/19 62    Renal function: CrCl cannot be calculated (Patient's most recent lab result is older than the maximum 21 days allowed.).  Past Medical History:  Diagnosis Date  . Acute right ventricular myocardial infarction ([place]) 09/21/2016  . Cardiogenic shock ([place]) 09/21/2016  . Chronic combined systolic and diastolic heart failure ([place]) 09/21/2016  . Coronary artery disease involving native coronary artery of native heart without angina pectoris 09/21/2016   a - Inf and RV infarct (STEMI) 1/18: LHC - pLAD, oD1  80, pRI 70, oLCx 30, mLCx 70, RCA 100, EF 45-50 >> PCI:  2.5 x 20 mm Promus DES to pRCA // Staged PCI 09/22/16: 3 x 38 mm Promus DES to mLAD; Attempted POBA to oD2 (80 >> 80); 3 x 28 mm Promus DES to RI  . Enlarged prostate   . History of ST elevation myocardial infarction (STEMI) 09/21/2016  . Hyperlipidemia 09/29/2016  . Hyperlipidemia 09/29/2016  . Hypertension   . Ischemic cardiomyopathy 09/23/2016   a - s/p Inf and RV infarct (STEMI) 1/18 // b - Echo 09/23/16: EF 35-40, inf-septal HK, apical septal and apical AK, ant and inf AK, ant-septal AK, Gr 1 diastolic dysfunction // c -Echo 5/18: mild conc LVH, EF 35-40, Gr 1 DD, inf-septal, ant-septal, ant, apical AK  . Noncompliance with medication regimen   . Presence of drug coated stent in right coronary artery 09/21/2016  . S/P angioplasty with stent, LAD and Ramus with DES 09/22/16 09/23/2016  . STEMI involving oth coronary artery of inferior wall ([place]) 09/21/2016    Current Outpatient Medications on File Prior to Visit  Medication Sig Dispense Refill  . aspirin EC 81 MG EC tablet Take 1 tablet (81 mg total) by mouth daily.    Marland Kitchen atorvastatin (LIPITOR) 80 MG tablet Take 1 tablet (80 mg total) by mouth every evening. 90 tablet 2  . carvedilol (COREG)  6.25 MG tablet Take 1 tablet (6.25 mg total) by mouth in the morning and at bedtime. 180 tablet 3  . ezetimibe (ZETIA) 10 MG tablet Take 1 tablet (10 mg total) by mouth daily. 90 tablet 3  . famotidine (PEPCID) 20 MG tablet TAKE 1 TABLET(20 MG) BY MOUTH TWICE DAILY 60 tablet 5  . levofloxacin (LEVAQUIN) 750 MG tablet Take 750 mg by mouth every morning.    . meloxicam (MOBIC) 7.5 MG tablet TAKE 1 TABLET(7.5 MG) BY MOUTH DAILY 30 tablet 0  . nitroGLYCERIN (NITROSTAT) 0.4 MG SL tablet PLACE 1 TABLET UNDER TONGUE EVERY 5 MINUTES FOR 3 DOSES AS NEEDED FOR CHEST PAIN 25 tablet 4  . sacubitril-valsartan (ENTRESTO) 24-26 MG Take 1 tablet by mouth 2 (two) times daily. 60 tablet 0  . tamsulosin (FLOMAX) 0.4 MG CAPS  capsule Take 0.4 mg by mouth at bedtime.     No current facility-administered medications on file prior to visit.    No Known Allergies   Assessment/Plan:  1. CHF - Patient ***       Esmeralda Links (PharmD Candidate 2022)  Ramond Dial, Pharm.D, BCPS, CPP [facility]  [address], [place], [place] [address]  Phone: [phone]; Fax: [phone]

## 2020-05-08 ENCOUNTER — Other Ambulatory Visit: Payer: Managed Care, Other (non HMO)

## 2020-05-08 ENCOUNTER — Ambulatory Visit: Payer: Managed Care, Other (non HMO)

## 2020-05-08 ENCOUNTER — Other Ambulatory Visit (HOSPITAL_COMMUNITY): Payer: Managed Care, Other (non HMO)

## 2020-05-21 NOTE — Progress Notes (Signed)
Patient ID: Randy Moore                 DOB: [birth year]                      MRN: [id number]     HPI: Randy Moore is a 54 y.o. male referred by Dr. Burt Knack to pharmacy clinic for HF medication management. PMH is significant for CAD s/p MI in 2018 with DES to RCA and mid LAD, CHF with LVEF 30-35% on 01/2019 echo, HLD, and medication nonadherence. He was last seen a month ago and changed from losartan to [place].  Pt presents today in good spirits. He reports compliance with all medications. Has not taken AM doses of his meds yet today though. Hasn't used NTG in a year. Reports his breathing and energy levels are good, denies swelling. Taking atorvastatin and ezetimibe with LDL of 64.   Current CHF meds: Entresto 24-26mg  BID, carvedilol 6.25mg  BID Previously tried: losartan - changed to Entresto BP goal: <130/68mmHg  Family History: Heart attack in his father; Other in his mother. There is no history of Colon cancer, Liver cancer, Stomach cancer, Rectal cancer, or Esophageal cancer.  Social History: Denies tobacco, alcohol, and illicit drug use.  Diet: Likes bread, rice, and burgers.  Exercise: Stays active playing basketball and soccer  Home BP readings: doesn't check  Wt Readings from Last 3 Encounters:  04/23/20 181 lb 12.8 oz (82.5 kg)  12/20/19 177 lb (80.3 kg)  12/16/19 174 lb (78.9 kg)   BP Readings from Last 3 Encounters:  04/23/20 104/72  12/20/19 120/84  12/16/19 139/80   Pulse Readings from Last 3 Encounters:  04/23/20 73  12/20/19 69  12/16/19 62    Renal function: CrCl cannot be calculated (Patient's most recent lab result is older than the maximum 21 days allowed.).  Past Medical History:  Diagnosis Date  . Acute right ventricular myocardial infarction ([place]) 09/21/2016  . Cardiogenic shock ([place]) 09/21/2016  . Chronic combined systolic and diastolic heart failure ([place]) 09/21/2016  . Coronary artery disease involving native coronary artery of native heart without  angina pectoris 09/21/2016   a - Inf and RV infarct (STEMI) 1/18: LHC - pLAD, oD1 80, pRI 70, oLCx 30, mLCx 70, RCA 100, EF 45-50 >> PCI:  2.5 x 20 mm Promus DES to pRCA // Staged PCI 09/22/16: 3 x 38 mm Promus DES to mLAD; Attempted POBA to oD2 (80 >> 80); 3 x 28 mm Promus DES to RI  . Enlarged prostate   . History of ST elevation myocardial infarction (STEMI) 09/21/2016  . Hyperlipidemia 09/29/2016  . Hyperlipidemia 09/29/2016  . Hypertension   . Ischemic cardiomyopathy 09/23/2016   a - s/p Inf and RV infarct (STEMI) 1/18 // b - Echo 09/23/16: EF 35-40, inf-septal HK, apical septal and apical AK, ant and inf AK, ant-septal AK, Gr 1 diastolic dysfunction // c -Echo 5/18: mild conc LVH, EF 35-40, Gr 1 DD, inf-septal, ant-septal, ant, apical AK  . Noncompliance with medication regimen   . Presence of drug coated stent in right coronary artery 09/21/2016  . S/P angioplasty with stent, LAD and Ramus with DES 09/22/16 09/23/2016  . STEMI involving oth coronary artery of inferior wall ([place]) 09/21/2016    Current Outpatient Medications on File Prior to Visit  Medication Sig Dispense Refill  . aspirin EC 81 MG EC tablet Take 1 tablet (81 mg total) by mouth daily.    Marland Kitchen atorvastatin (LIPITOR) 80  MG tablet Take 1 tablet (80 mg total) by mouth every evening. 90 tablet 2  . carvedilol (COREG) 6.25 MG tablet Take 1 tablet (6.25 mg total) by mouth in the morning and at bedtime. 180 tablet 3  . ezetimibe (ZETIA) 10 MG tablet Take 1 tablet (10 mg total) by mouth daily. 90 tablet 3  . famotidine (PEPCID) 20 MG tablet TAKE 1 TABLET(20 MG) BY MOUTH TWICE DAILY 60 tablet 5  . levofloxacin (LEVAQUIN) 750 MG tablet Take 750 mg by mouth every morning.    . meloxicam (MOBIC) 7.5 MG tablet TAKE 1 TABLET(7.5 MG) BY MOUTH DAILY 30 tablet 0  . nitroGLYCERIN (NITROSTAT) 0.4 MG SL tablet PLACE 1 TABLET UNDER TONGUE EVERY 5 MINUTES FOR 3 DOSES AS NEEDED FOR CHEST PAIN 25 tablet 4  . sacubitril-valsartan (ENTRESTO) 24-26 MG Take 1 tablet  by mouth 2 (two) times daily. 60 tablet 0  . tamsulosin (FLOMAX) 0.4 MG CAPS capsule Take 0.4 mg by mouth at bedtime.     No current facility-administered medications on file prior to visit.    No Known Allergies   Assessment/Plan:  1. CHF - BP close to goal <130/83mmHg with room to optimize CHF medications. Will check BMET today since pt recently changed from losartan to [facility]. Pending lab results, will either increase Entresto, start Jardiance, or start spironolactone. 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 Of note, he will need an Entresto refill sent in once medication plan is finalized. Will also need follow up scheduled within 2 weeks. Encouraged pt to monitor BP at home as well.  Lanisha Stepanian E. Giordan Fordham, PharmD, BCACP, [facility] [address] [place], [place] [address] Phone: [phone]; Fax: [phone] 05/22/2020 10:14 AM

## 2020-05-21 NOTE — Patient Instructions (Addendum)
It was nice to meet you today  Your blood pressure goal is less than 130/53mmHg  Continue taking your medications for now. We will give you a call when your lab results come back and will likely change some of your medication  Monitor your blood pressure at home

## 2020-05-22 ENCOUNTER — Other Ambulatory Visit: Payer: Self-pay

## 2020-05-22 ENCOUNTER — Ambulatory Visit (INDEPENDENT_AMBULATORY_CARE_PROVIDER_SITE_OTHER): Payer: Managed Care, Other (non HMO) | Admitting: Pharmacist

## 2020-05-22 ENCOUNTER — Telehealth: Payer: Self-pay | Admitting: Pharmacist

## 2020-05-22 VITALS — BP 132/80 | HR 63 | Wt 176.0 lb

## 2020-05-22 DIAGNOSIS — I5042 Chronic combined systolic (congestive) and diastolic (congestive) heart failure: Secondary | ICD-10-CM

## 2020-05-22 LAB — BASIC METABOLIC PANEL
BUN/Creatinine Ratio: 10 (ref 9–20)
BUN: 11 mg/dL (ref 6–24)
CO2: 26 mmol/L (ref 20–29)
Calcium: 9.2 mg/dL (ref 8.7–10.2)
Chloride: 102 mmol/L (ref 96–106)
Creatinine, Ser: 1.15 mg/dL (ref 0.76–1.27)
GFR calc Af Amer: 82 mL/min/{1.73_m2} (ref >59)
GFR calc non Af Amer: 71 mL/min/{1.73_m2} (ref >59)
Glucose: 114 mg/dL — ABNORMAL HIGH (ref 65–99)
Potassium: 3.8 mmol/L (ref 3.5–5.2)
Sodium: 140 mmol/L (ref 134–144)

## 2020-05-22 MED ORDER — EZETIMIBE 10 MG PO TABS: 10.0000 mg | ORAL_TABLET | Freq: Every day | ORAL | 3 refills | Status: DC

## 2020-05-22 MED ORDER — ENTRESTO 49-51 MG PO TABS: 1.0000 | ORAL_TABLET | Freq: Two times a day (BID) | ORAL | 5 refills | Status: DC

## 2020-05-22 NOTE — Telephone Encounter (Signed)
BMET stable since changing to [facility]. Will increase Entresto dose to 49-51mg  BID. New rx sent to pharmacy, $10/month copay card activated and called to pharmacy as well.  Called pt and left message. Will need to inform him of dose increase and schedule follow up with PharmD in 10 days.

## 2020-05-25 NOTE — Telephone Encounter (Signed)
Called and spoke with patient. Advised that labs were stable and he is to increase Entresto to 46/51mg  BID. Rx sent to pharmacy along with copay card by [facility]. Pt scheduled in office for visit on 10/13. Will also get BMET at that visit.

## 2020-05-29 ENCOUNTER — Other Ambulatory Visit: Payer: Self-pay

## 2020-05-29 ENCOUNTER — Ambulatory Visit (HOSPITAL_COMMUNITY): Payer: Managed Care, Other (non HMO) | Attending: Cardiology

## 2020-05-29 DIAGNOSIS — I10 Essential (primary) hypertension: Secondary | ICD-10-CM

## 2020-05-29 DIAGNOSIS — I5042 Chronic combined systolic (congestive) and diastolic (congestive) heart failure: Secondary | ICD-10-CM | POA: Diagnosis not present

## 2020-05-29 MED ORDER — PERFLUTREN LIPID MICROSPHERE
1.0000 mL | INTRAVENOUS | Status: AC | PRN
  Administered 2020-05-29: 2 mL via INTRAVENOUS

## 2020-05-30 LAB — ECHOCARDIOGRAM COMPLETE
Area-P 1/2: 3.21 cm2
S' Lateral: 3.1 cm

## 2020-06-02 NOTE — Progress Notes (Unsigned)
Patient ID: Randy Moore                 DOB: 1966-03-25                      MRN: [id number]     HPI: Randy Moore is a 54 y.o. male referred by Dr. Burt Knack for HF medication management .  PMH is significant for CAD s/p MI in 2018 with DES to RCA and mid LAD, CHF with LVEF 40-45% (05/29/2020) 30-35% on 01/2019 echo, HLD, and medication nonadherence. He was last seen a month ago and changed from losartan to [place].  The patient's [name] was further increased 05/25/20 after post-initiation BMET indicated tolerance. Patient was provided $10/month copay card for cost assistance. Most recent echo   Dizziness? Orthostasis? HAs? Confusion?  Last needed NTG? Get weight for possible Coreg increase?   Current HTN meds: Entresto 46/51mg  BID, carvedilol 6.25mg  BID Previously tried: losartan 50mg  daily (changed to Entresto) BP goal: <130/80 mmhg   Family History: Heart attack in his father; Other in his mother. There is no history of Colon cancer, Liver cancer, Stomach cancer, Rectal cancer, or Esophageal cancer.  Social History: Denies tobacco, alcohol, and illicit drug use.  Diet: Likes bread, rice, and burgers.  Exercise: Soccer, basketball (no significant physical limitations)   Home BP readings: doesn't check  Wt Readings from Last 3 Encounters:  05/22/20 176 lb (79.8 kg)  04/23/20 181 lb 12.8 oz (82.5 kg)  12/20/19 177 lb (80.3 kg)   BP Readings from Last 3 Encounters:  05/22/20 132/80  04/23/20 104/72  12/20/19 120/84   Pulse Readings from Last 3 Encounters:  05/22/20 63  04/23/20 73  12/20/19 69    Renal function: Estimated Creatinine Clearance: 69.4 mL/min (by C-G formula based on SCr of 1.15 mg/dL).  Past Medical History:  Diagnosis Date  . Acute right ventricular myocardial infarction ([place]) 09/21/2016  . Cardiogenic shock ([place]) 09/21/2016  . Chronic combined systolic and diastolic heart failure ([place]) 09/21/2016  . Coronary artery disease involving native coronary  artery of native heart without angina pectoris 09/21/2016   a - Inf and RV infarct (STEMI) 1/18: LHC - pLAD, oD1 80, pRI 70, oLCx 30, mLCx 70, RCA 100, EF 45-50 >> PCI:  2.5 x 20 mm Promus DES to pRCA // Staged PCI 09/22/16: 3 x 38 mm Promus DES to mLAD; Attempted POBA to oD2 (80 >> 80); 3 x 28 mm Promus DES to RI  . Enlarged prostate   . History of ST elevation myocardial infarction (STEMI) 09/21/2016  . Hyperlipidemia 09/29/2016  . Hyperlipidemia 09/29/2016  . Hypertension   . Ischemic cardiomyopathy 09/23/2016   a - s/p Inf and RV infarct (STEMI) 1/18 // b - Echo 09/23/16: EF 35-40, inf-septal HK, apical septal and apical AK, ant and inf AK, ant-septal AK, Gr 1 diastolic dysfunction // c -Echo 5/18: mild conc LVH, EF 35-40, Gr 1 DD, inf-septal, ant-septal, ant, apical AK  . Noncompliance with medication regimen   . Presence of drug coated stent in right coronary artery 09/21/2016  . S/P angioplasty with stent, LAD and Ramus with DES 09/22/16 09/23/2016  . STEMI involving oth coronary artery of inferior wall ([place]) 09/21/2016    Current Outpatient Medications on File Prior to Visit  Medication Sig Dispense Refill  . aspirin EC 81 MG EC tablet Take 1 tablet (81 mg total) by mouth daily.    Marland Kitchen atorvastatin (LIPITOR) 80 MG tablet Take 1 tablet (  80 mg total) by mouth every evening. 90 tablet 2  . carvedilol (COREG) 6.25 MG tablet Take 1 tablet (6.25 mg total) by mouth in the morning and at bedtime. 180 tablet 3  . ezetimibe (ZETIA) 10 MG tablet Take 1 tablet (10 mg total) by mouth daily. 90 tablet 3  . famotidine (PEPCID) 20 MG tablet TAKE 1 TABLET(20 MG) BY MOUTH TWICE DAILY 60 tablet 5  . meloxicam (MOBIC) 7.5 MG tablet TAKE 1 TABLET(7.5 MG) BY MOUTH DAILY 30 tablet 0  . nitroGLYCERIN (NITROSTAT) 0.4 MG SL tablet PLACE 1 TABLET UNDER TONGUE EVERY 5 MINUTES FOR 3 DOSES AS NEEDED FOR CHEST PAIN (Patient not taking: Reported on 05/22/2020) 25 tablet 4  . sacubitril-valsartan (ENTRESTO) 49-51 MG Take 1 tablet by  mouth 2 (two) times daily. 60 tablet 5  . tamsulosin (FLOMAX) 0.4 MG CAPS capsule Take 0.4 mg by mouth at bedtime.     No current facility-administered medications on file prior to visit.    No Known Allergies  There were no vitals taken for this visit.   Assessment/Plan:  1. Hypertension -  - BMET today (K, Scr)  - to make BP room, may change carvedilol to metoprolol  - GDMT:   - increase BB             - increase Entresto ([facility]-HF trial) - no sooner than 10/18             - initiate Spironolactone             - initiate SGLT2i  - To decide, will assess:         - volume status         - BP         -  Latest BMET  Thank you  Ramond Dial, Pharm.D, BCPS, CPP [facility]  [address] [place], [place] [address]  Phone: [phone]; Fax: [phone]

## 2020-06-03 ENCOUNTER — Ambulatory Visit: Payer: Managed Care, Other (non HMO)

## 2020-06-03 ENCOUNTER — Telehealth: Payer: Self-pay | Admitting: Cardiovascular Disease

## 2020-06-03 NOTE — Telephone Encounter (Signed)
Reviewed results with patient who verbalized understanding. 

## 2020-06-03 NOTE — Telephone Encounter (Signed)
Patient returned call on 06/02/20 at 5:25PM to discuss echo results.

## 2020-06-03 NOTE — Telephone Encounter (Signed)
-----   Message from Sherren Mocha, MD sent at 06/02/2020  4:51 PM EDT ----- Doristine Devoid result. Improved LV function compared with prior studies. Continue current med Rx.

## 2020-06-04 NOTE — Progress Notes (Signed)
Patient ID: Randy Moore                 DOB: 05/21/1966                      MRN: [id number]     HPI: Randy Moore is a 54 y.o. male referred by Dr. Burt Moore for HF medication management. PMH is significant for CAD s/p MI in 2018 with DES to RCA and mid LAD, CHF with LVEF 40-45% (05/29/2020) 30-35% on 01/2019 echo, HLD, and medication nonadherence. He was last seen a month ago and changed from losartan to Randy Moore.  The patient's Randy Moore was further increased 05/25/20 after post-initiation BMET indicated tolerance. Patient was provided $10/month copay card for cost assistance.   He has been feeling really good; denies dizziness, HA, confusion. No issues with adherence or obtaining medications. Pt has not needed NTG x 2.40yrs. Patient has not been checking weight at home but denies any edema, coughing, or trouble breathing when laying down. Per exam, the patient has no LEE. Has also has had no symptoms of increased fatigue, exercise intolerance, or increased SOB.  Has been playing soccer regularly with no issues or noted changes.   Current HF meds: Entresto 49/51mg  BID, carvedilol 6.25mg  BID Previously tried: losartan 50mg  daily (changed to Entresto) BP goal: <130/80 mmhg   Family History:Heart attack in his father; Other in his mother. There is no history of Colon cancer, Liver cancer, Stomach cancer, Rectal cancer, or Esophageal cancer.  Social History:Denies tobacco, alcohol, and illicit drug use.  Diet:Likes bread, rice, and burgers.  Exercise:Soccer, basketball (no significant physical limitations)   Home BP readings: occasionally checks BP at home, never writes down. He states that they are always "normal".      Wt Readings from Last 3 Encounters:  05/22/20 176 lb (79.8 kg)  04/23/20 181 lb 12.8 oz (82.5 kg)  12/20/19 177 lb (80.3 kg)      BP Readings from Last 3 Encounters:  05/22/20 132/80  04/23/20 104/72  12/20/19 120/84      Pulse Readings from Last 3  Encounters:  05/22/20 63  04/23/20 73  12/20/19 69    Renal function: Estimated Creatinine Clearance: 69.4 mL/min (by C-G formula based on SCr of 1.15 mg/dL).      Past Medical History:  Diagnosis Date  . Acute right ventricular myocardial infarction ([place]) 09/21/2016  . Cardiogenic shock ([place]) 09/21/2016  . Chronic combined systolic and diastolic heart failure ([place]) 09/21/2016  . Coronary artery disease involving native coronary artery of native heart without angina pectoris 09/21/2016   a - Inf and RV infarct (STEMI) 1/18: LHC - pLAD, oD1 80, pRI 70, oLCx 30, mLCx 70, RCA 100, EF 45-50 >> PCI:  2.5 x 20 mm Promus DES to pRCA // Staged PCI 09/22/16: 3 x 38 mm Promus DES to mLAD; Attempted POBA to oD2 (80 >> 80); 3 x 28 mm Promus DES to RI  . Enlarged prostate   . History of ST elevation myocardial infarction (STEMI) 09/21/2016  . Hyperlipidemia 09/29/2016  . Hyperlipidemia 09/29/2016  . Hypertension   . Ischemic cardiomyopathy 09/23/2016   a - s/p Inf and RV infarct (STEMI) 1/18 // b - Echo 09/23/16: EF 35-40, inf-septal HK, apical septal and apical AK, ant and inf AK, ant-septal AK, Gr 1 diastolic dysfunction // c -Echo 5/18: mild conc LVH, EF 35-40, Gr 1 DD, inf-septal, ant-septal, ant, apical AK  . Noncompliance with medication regimen   .  Presence of drug coated stent in right coronary artery 09/21/2016  . S/P angioplasty with stent, LAD and Ramus with DES 09/22/16 09/23/2016  . STEMI involving oth coronary artery of inferior wall ([place]) 09/21/2016          Current Outpatient Medications on File Prior to Visit  Medication Sig Dispense Refill  . aspirin EC 81 MG EC tablet Take 1 tablet (81 mg total) by mouth daily.    Marland Kitchen atorvastatin (LIPITOR) 80 MG tablet Take 1 tablet (80 mg total) by mouth every evening. 90 tablet 2  . carvedilol (COREG) 6.25 MG tablet Take 1 tablet (6.25 mg total) by mouth in the morning and at bedtime. 180 tablet 3  . ezetimibe (ZETIA) 10 MG tablet Take 1 tablet  (10 mg total) by mouth daily. 90 tablet 3  . famotidine (PEPCID) 20 MG tablet TAKE 1 TABLET(20 MG) BY MOUTH TWICE DAILY 60 tablet 5  . meloxicam (MOBIC) 7.5 MG tablet TAKE 1 TABLET(7.5 MG) BY MOUTH DAILY 30 tablet 0  . nitroGLYCERIN (NITROSTAT) 0.4 MG SL tablet PLACE 1 TABLET UNDER TONGUE EVERY 5 MINUTES FOR 3 DOSES AS NEEDED FOR CHEST PAIN (Patient not taking: Reported on 05/22/2020) 25 tablet 4  . sacubitril-valsartan (ENTRESTO) 49-51 MG Take 1 tablet by mouth 2 (two) times daily. 60 tablet 5  . tamsulosin (FLOMAX) 0.4 MG CAPS capsule Take 0.4 mg by mouth at bedtime.     No current facility-administered medications on file prior to visit.    No Known Allergies  Vitals obtained today:  Wt 178.9 lb BP: 122/70 HR: 71 SPO2: 97%  Assessment/Plan:  1. Heart failure  -  Pt appears to be doing well today without any signs of heart failure exacerbation. The patient seems to be tolerating the recent increase of Entresto well with no signs of hypotension, orthostasis, dizziness or increased fatigue. Patient has not had renal intolerance to previous Entresto titration, but will check BMP today to confirm that the patient can tolerate further dose titration. In order to be at GDMT dosing, the patient's carvedilol should be increased as well (target dose 25 mg BID). While the patient appears euvolemic today (no edema, weight roughly baseline) and while the patient's HR is 71 in clinic, the patient's lack of home BP readings and hx of documented HR in the low 60s pose some concern for carvedilol increase at this time. Further, the patient is very active given recreational soccer and basketball participation. A titration of carvedilol with unknown true home HR readings, may pose concern for exercise limitations. Future GDMT considerations include initiation of spironolactone, but will prioritize Entresto and Carvedilol at this time due to recent benefit in LVEF & per AHA/ACC guidelines.  Will call  patient with directive to increase carvedilol vs Entresto after BMP results obtained. Requested patient begin documenting BP, HR, weight a few times a week and bring results to his visits. Will f/u with patient in clinic in 2 wks to assess further medication optimization.  Thank you, Clinton Quant, Pharmacy Student class of [address] [place], [place].D, BCPS, CPP [facility]  [address], [place], [place] [address]  Phone: [phone]; Fax: [phone]

## 2020-06-08 ENCOUNTER — Ambulatory Visit (INDEPENDENT_AMBULATORY_CARE_PROVIDER_SITE_OTHER): Payer: Managed Care, Other (non HMO) | Admitting: Pharmacist

## 2020-06-08 ENCOUNTER — Other Ambulatory Visit: Payer: Self-pay

## 2020-06-08 VITALS — BP 122/70 | HR 71 | Wt 178.9 lb

## 2020-06-08 DIAGNOSIS — I5042 Chronic combined systolic (congestive) and diastolic (congestive) heart failure: Secondary | ICD-10-CM

## 2020-06-08 LAB — BASIC METABOLIC PANEL
BUN/Creatinine Ratio: 8 — ABNORMAL LOW (ref 9–20)
BUN: 8 mg/dL (ref 6–24)
CO2: 29 mmol/L (ref 20–29)
Calcium: 9.3 mg/dL (ref 8.7–10.2)
Chloride: 102 mmol/L (ref 96–106)
Creatinine, Ser: 1.01 mg/dL (ref 0.76–1.27)
GFR calc Af Amer: 96 mL/min/{1.73_m2} (ref >59)
GFR calc non Af Amer: 83 mL/min/{1.73_m2} (ref >59)
Glucose: 105 mg/dL — ABNORMAL HIGH (ref 65–99)
Potassium: 4 mmol/L (ref 3.5–5.2)
Sodium: 141 mmol/L (ref 134–144)

## 2020-06-08 NOTE — Patient Instructions (Addendum)
Thank you for seeing [place] today!  Your vitals are looking great. We are going to get updated labs on you today to see how your kidneys are doing. We would like to increase your carvedilol (6.25mg  BID increased to 12.5mg  BID) and your Entresto (46/51mg  BID increased to 97/103mg  BID)  in the future, but will only do one at a time. This would mean either doubling your carvedilol or your Entresto.   As soon as we get labs back, we will call you to discuss which medication we would like you to increase at this time.  If possible, we would like you to measure your BP and HR a few times a week and write it down for [place]. Also, if possible, measure your weight daily and let us know if your weight increases by 3 lb in 1 day or 5lb in 1wk.   We will schedule follow-up labs with you in 2 wks. If you need [place], please call us directly at [phone].  Thank you! Abby

## 2020-06-09 ENCOUNTER — Telehealth: Payer: Self-pay | Admitting: Pharmacist

## 2020-06-09 MED ORDER — ENTRESTO 97-103 MG PO TABS: 1.0000 | ORAL_TABLET | Freq: Two times a day (BID) | ORAL | 11 refills | Status: DC

## 2020-06-09 NOTE — Telephone Encounter (Signed)
BMET stable from yesterday. Will increase Entresto to 97-103mg  BID. Pt aware to double up on current rx, then pick up refill for higher strength and start taking 1 tablet twice daily. Has follow up scheduled in 2 weeks, will recheck BMET at that time.

## 2020-06-12 ENCOUNTER — Encounter: Payer: Self-pay | Admitting: General Practice

## 2020-06-17 ENCOUNTER — Encounter: Payer: Self-pay | Admitting: Radiation Oncology

## 2020-06-17 NOTE — Progress Notes (Signed)
GU Location of Tumor / Histology: prostatic adenocarcinoma  If Prostate Cancer, Gleason Score is (3 + 4) and PSA is (6.49). Prostate volume: 25.67 g  Randy Moore was referred to Dr. Claudia Desanctis in August 2021 for further evaluation of an elevated PSA.   Biopsies of prostate (if applicable) revealed:    Past/Anticipated interventions by urology, if any: prostate biopsy, prescribed flomax, referral to Dr. Tammi Klippel for consideration of radiation therapy  Past/Anticipated interventions by medical oncology, if any: no  Weight changes, if any: no  Bowel/Bladder complaints, if any: Reports urinary frequency and nocturia.   Nausea/Vomiting, if any: no  Pain issues, if any:    SAFETY ISSUES: Prior radiation?  Pacemaker/ICD?  Possible current pregnancy? no, male patient Is the patient on methotrexate?   Current Complaints / other details:  54 year old male. Married with four children.

## 2020-06-18 NOTE — Progress Notes (Unsigned)
Patient ID: Randy Moore                 DOB: 1966/04/27                      MRN: [id number]     HPI: Randy Moore is a 54 y.o. male referred by Dr. Kara Mead a 54 y.o.malereferred by Dr. Burt Knack for HF medication management.PMH is significant for CAD s/p MI in 2018 with DES to RCA and mid LAD, CHF with LVEF40-45% (05/29/2020)30-35% on 01/2019 echo, HLD, and medication nonadherence. He was last seen a month ago and changed from losartan to [place].  The patient was last seen by our clinic 06/08/21 at which time the patient's Entresto was increased to 97-103mg  BID after BMET indicated stable renal function. Patient was provided $10/month copay cardfor cost assistance. Of note, the patient is very active (plays recreational sports) so Entresto rather than Coreg has been the focus of medication optimization up until this point to allow him maximal exercise capacity.   Current HF meds:Entresto 49/51mg  BID, carvedilol 6.25mg  BID Previously tried:losartan 50mg  daily (changed to Entresto) BP goal:<130/80 mmhg  Family History:Heart attack in his father; Other in his mother. There is no history of Colon cancer, Liver cancer, Stomach cancer, Rectal cancer, or Esophageal cancer.  Social History:Denies tobacco, alcohol, and illicit drug use.  Diet:Likes bread, rice, and burgers.  Exercise:Soccer, basketball (no significant physical limitations)  Home BP readings:     Wt Readings from Last 3 Encounters:  06/08/20 178 lb 14.4 oz (81.1 kg)  05/22/20 176 lb (79.8 kg)  04/23/20 181 lb 12.8 oz (82.5 kg)   BP Readings from Last 3 Encounters:  06/08/20 122/70  05/22/20 132/80  04/23/20 104/72   Pulse Readings from Last 3 Encounters:  06/08/20 71  05/22/20 63  04/23/20 73   BMP Latest Ref Rng & Units 06/08/2020 05/22/2020 01/03/2020  Glucose 65 - 99 mg/dL 105(H) 114(H) 89  BUN 6 - 24 mg/dL 8 11 11   Creatinine 0.76 - 1.27 mg/dL 1.01 1.15 1.02  BUN/Creat Ratio 9 - 20 8(L)  10 11  Sodium 134 - 144 mmol/L 141 140 140  Potassium 3.5 - 5.2 mmol/L 4.0 3.8 3.8  Chloride 96 - 106 mmol/L 102 102 101  CO2 20 - 29 mmol/L 29 26 26   Calcium 8.7 - 10.2 mg/dL 9.3 9.2 9.4   Renal function: Estimated Creatinine Clearance: 79 mL/min (by C-G formula based on SCr of 1.01 mg/dL).  Past Medical History:  Diagnosis Date  . Acute right ventricular myocardial infarction ([place]) 09/21/2016  . Cardiogenic shock ([place]) 09/21/2016  . Chronic combined systolic and diastolic heart failure ([place]) 09/21/2016  . Coronary artery disease involving native coronary artery of native heart without angina pectoris 09/21/2016   a - Inf and RV infarct (STEMI) 1/18: LHC - pLAD, oD1 80, pRI 70, oLCx 30, mLCx 70, RCA 100, EF 45-50 >> PCI:  2.5 x 20 mm Promus DES to pRCA // Staged PCI 09/22/16: 3 x 38 mm Promus DES to mLAD; Attempted POBA to oD2 (80 >> 80); 3 x 28 mm Promus DES to RI  . Enlarged prostate   . History of ST elevation myocardial infarction (STEMI) 09/21/2016  . Hyperlipidemia 09/29/2016  . Hyperlipidemia 09/29/2016  . Hypertension   . Ischemic cardiomyopathy 09/23/2016   a - s/p Inf and RV infarct (STEMI) 1/18 // b - Echo 09/23/16: EF 35-40, inf-septal HK, apical septal and apical AK, ant and inf  AK, ant-septal AK, Gr 1 diastolic dysfunction // c -Echo 5/18: mild conc LVH, EF 35-40, Gr 1 DD, inf-septal, ant-septal, ant, apical AK  . Noncompliance with medication regimen   . Presence of drug coated stent in right coronary artery 09/21/2016  . Prostate cancer ([place])   . S/P angioplasty with stent, LAD and Ramus with DES 09/22/16 09/23/2016  . STEMI involving oth coronary artery of inferior wall ([place]) 09/21/2016    Current Outpatient Medications on File Prior to Visit  Medication Sig Dispense Refill  . aspirin EC 81 MG EC tablet Take 1 tablet (81 mg total) by mouth daily.    Marland Kitchen atorvastatin (LIPITOR) 80 MG tablet Take 1 tablet (80 mg total) by mouth every evening. 90 tablet 2  . carvedilol (COREG) 6.25 MG  tablet Take 1 tablet (6.25 mg total) by mouth in the morning and at bedtime. 180 tablet 3  . ezetimibe (ZETIA) 10 MG tablet Take 1 tablet (10 mg total) by mouth daily. 90 tablet 3  . famotidine (PEPCID) 20 MG tablet TAKE 1 TABLET(20 MG) BY MOUTH TWICE DAILY 60 tablet 5  . meloxicam (MOBIC) 7.5 MG tablet TAKE 1 TABLET(7.5 MG) BY MOUTH DAILY (Patient not taking: Reported on 06/08/2020) 30 tablet 0  . nitroGLYCERIN (NITROSTAT) 0.4 MG SL tablet PLACE 1 TABLET UNDER TONGUE EVERY 5 MINUTES FOR 3 DOSES AS NEEDED FOR CHEST PAIN (Patient not taking: Reported on 05/22/2020) 25 tablet 4  . sacubitril-valsartan (ENTRESTO) 97-103 MG Take 1 tablet by mouth 2 (two) times daily. 60 tablet 11  . tamsulosin (FLOMAX) 0.4 MG CAPS capsule Take 0.4 mg by mouth at bedtime.     No current facility-administered medications on file prior to visit.    No Known Allergies  There were no vitals taken for this visit.   Assessment/Plan:  1. Hypertension -  - BMET today after Entersto increase - would prefer spironolactone 12.5mg  daily vs Coreg increase given lifestyle  Thank you  Melissa D Maccia, Pharm.D, BCPS, CPP [place]  [address] [place], [place] [address]  Phone: [phone]; Fax: [phone]

## 2020-06-19 ENCOUNTER — Ambulatory Visit: Payer: Managed Care, Other (non HMO)

## 2020-06-19 ENCOUNTER — Ambulatory Visit
Admission: RE | Admit: 2020-06-19 | Discharge: 2020-06-19 | Disposition: A | Discharge status: Home / Self Care | Payer: Managed Care, Other (non HMO) | Source: Ambulatory Visit | Attending: Radiation Oncology | Admitting: Radiation Oncology

## 2020-06-19 ENCOUNTER — Ambulatory Visit: Payer: Self-pay

## 2020-06-19 HISTORY — DX: Malignant neoplasm of prostate: C61

## 2020-07-20 NOTE — Progress Notes (Signed)
GU Location of Tumor / Histology: prostatic adenocarcinoma  If Prostate Cancer, Gleason Score is (3 + 4) and PSA is (6.49). Prostate volume: 25.67 grams.   [name] presented to Dr. Claudia Desanctis as a referral from his PCP for further evaluation of an elevated PSA of 4.1  Biopsies of prostate (if applicable) revealed:   Past/Anticipated interventions by urology, if any: prescribed flomax, prostate biopsy, discussed surgical options, referred to Dr. Tammi Klippel to discuss radiation options.  Past/Anticipated interventions by medical oncology, if any: no  Weight changes, if any: no  Bowel/Bladder complaints, if any: Reports nocturia x 2. Reports urinary frequency.   Nausea/Vomiting, if any: no  Pain issues, if any:    SAFETY ISSUES: Prior radiation?  Pacemaker/ICD?  Possible current pregnancy? no, male patient Is the patient on methotrexate? no  Current Complaints / other details:  54 year old male. Married. Resides in [place].

## 2020-07-21 ENCOUNTER — Telehealth: Payer: Self-pay | Admitting: Radiation Oncology

## 2020-07-21 ENCOUNTER — Ambulatory Visit
Admission: RE | Admit: 2020-07-21 | Discharge: 2020-07-21 | Disposition: A | Discharge status: Home / Self Care | Payer: Self-pay | Source: Ambulatory Visit | Attending: Radiation Oncology | Admitting: Radiation Oncology

## 2020-07-21 ENCOUNTER — Ambulatory Visit: Payer: Self-pay

## 2020-07-21 NOTE — Telephone Encounter (Signed)
Patient hasn't checked in for appointment. Phoned to inquire. No answer. Left voicemail message requesting return call.

## 2020-07-22 ENCOUNTER — Telehealth: Payer: Self-pay | Admitting: *Deleted

## 2020-07-22 NOTE — Telephone Encounter (Signed)
LVM for call back to reschedule missed appointment  with Dr. Tammi Klippel.

## 2020-07-31 ENCOUNTER — Ambulatory Visit: Payer: Managed Care, Other (non HMO) | Admitting: Physician Assistant

## 2020-08-03 ENCOUNTER — Encounter: Payer: Self-pay | Admitting: Medical Oncology

## 2020-08-03 NOTE — Progress Notes (Signed)
Left voicemail requesting a return call to reschedule consult with Dr. Tammi Klippel. He was a no show 11/30.

## 2020-08-10 ENCOUNTER — Encounter: Payer: Self-pay | Admitting: Medical Oncology

## 2020-08-10 NOTE — Progress Notes (Signed)
Patient returned call stating he was out of town and missed his consult with Dr. Tammi Klippel. He would like to reschedule. I informed him, he will receive a call and he can arrange for a time that is good for him. He voiced understanding and was very appreciative.

## 2020-08-12 ENCOUNTER — Ambulatory Visit: Payer: Self-pay | Admitting: Physician Assistant

## 2020-09-10 ENCOUNTER — Telehealth: Payer: Self-pay | Admitting: Radiation Oncology

## 2020-09-10 NOTE — Telephone Encounter (Signed)
Received voicemail message from patient requesting return call. Phoned patient back. No answer. Left voicemail message with my direct line. Also, reminded patient of appointment with Dr. Tammi Klippel tomorrow, Friday, September 11, 2020 at 1030/1100.

## 2020-09-11 ENCOUNTER — Ambulatory Visit: Payer: Self-pay | Admitting: Radiation Oncology

## 2020-09-11 ENCOUNTER — Ambulatory Visit: Payer: Self-pay

## 2020-09-16 ENCOUNTER — Ambulatory Visit: Payer: Self-pay | Admitting: Physician Assistant

## 2020-10-05 ENCOUNTER — Encounter: Payer: Self-pay | Admitting: Radiation Oncology

## 2020-10-05 NOTE — Progress Notes (Signed)
CANCELLED CONSULT WITH DR. MANNING A SECOND TIME.  GU Location of Tumor / Histology: prostatic adenocarcinoma  If Prostate Cancer, Gleason Score is (3 + 4) and PSA is (6.49).  Prostate volume: 25.67 grams  [name] presented as a referral from his PCP for further evaluation of an elevated PSA of 4.1 in August 2021.  Biopsies of prostate (if applicable) revealed:   Past/Anticipated interventions by urology, if any: prostate biopsy, discussion reference treatment options, patient not interest in radical prostatectomy, referred to Dr. Tammi Klippel to discuss radiation options  Past/Anticipated interventions by medical oncology, if any: no  Weight changes, if any:   Bowel/Bladder complaints, if any: Reports urinary frequency and nocturia x 2. Started on Tamsulosin by Dr. Claudia Desanctis.   Nausea/Vomiting, if any: no  Pain issues, if any:    SAFETY ISSUES: Prior radiation?  Pacemaker/ICD?  Possible current pregnancy? no, male patient Is the patient on methotrexate?   Current Complaints / other details:  55 year old male. Married.

## 2020-10-06 ENCOUNTER — Ambulatory Visit
Admission: RE | Admit: 2020-10-06 | Discharge: 2020-10-06 | Disposition: A | Discharge status: Home / Self Care | Payer: 59 | Source: Ambulatory Visit | Attending: Radiation Oncology | Admitting: Radiation Oncology

## 2020-10-06 ENCOUNTER — Ambulatory Visit: Payer: 59

## 2020-10-29 NOTE — Progress Notes (Addendum)
Cardiology Office Note:    Date:  10/30/2020   ID:  Randy Moore, DOB 1965/12/17, MRN [id number]  PCP:  Long, Elba Dendinger, [place]  Cardiologist:  Sherren Mocha, MD   Advanced Practice Provider:  Liliane Shi, PA-C Electrophysiologist:  None       Referring MD: Shanon Rosser, PA-C   Chief Complaint:  Follow-up (CAD, CHF)    Patient Profile:    Randy Moore is a 55 y.o. male with:   Coronary artery disease  ? S/p inferior MI (RV infarct) 08/2016 c/b hypotension/bradycardia >>PCI: DES to RCA ? Staged PCI 08/2016: DES to mid LAD and DES to RI; POBA to D2 unsuccessful ? Myoview 01/2019: EF 26, large anterior scar, no ischemia  Ischemic CM  HFmrEF (heart failure with mildly reduced ejection fraction)  ? Echocardiogram 2/19: EF 35-40 ? Echo 01/2019: EF 30-35, GR 1 DD ? Echocardiogram 10/21: EF 40-45, Gr 1 DD  Hyperlipidemia  Hypertension   Medication nonadherence  Prior CV studies: Echocardiogram 05/29/20 EF 40-45, Gr 1 DD, normal RVSF  Echocardiogram 02/18/2019 EF 30-35, Gr 1 DD, ant-sept and apical AK  Myoview 02/01/2019 EF 26, no ischemia, Lg scar (ant, ant-sept, inf, inf-sept, apical); High Risk  Echo 09/26/17 EF 35-40, ant-sept, ant, apical AK, Gr 1 DD  Echo 12/23/16 Mild conc LVH, EF 35-40, Gr 1 DD, inf-sept, ant-sept, ant, apical sept and ant AK  Echo 09/23/16 EF 35-40, inf-septal HK, apical septal and apical AK, ant and inf AK, ant-septal AK, Gr 1 diastolic dysfunction   PCI 09/22/16 3 x 38 mm Promus DES to mLAD Attempted POBA to oD2 (80 >> 80) 3 x 28 mm Promus DES to RI  LHC 09/21/16 LAD prox 99, ostial D1 80 RI prox 70 LCx ost 30, mid 70 RCA 100 EF 45-50 PCI: 2.5 x 20 mm Promus DES to pRCA  History of Present Illness:    Randy Moore was last seen by Dr. Burt Knack in 9/21.  He was followed in the PharmD clinic with further titration of his CHF med.s.  A f/u echocardiogram in 10/21 demonstrated improved LVF with EF 40-45.   He returns for f/u.  He is here alone.  He continues to be active playing soccer, etc.  He has not had chest pain, shortness of breath, orthopnea, leg edema or syncope.        Past Medical History:  Diagnosis Date   Acute right ventricular myocardial infarction ([place]) 09/21/2016   Cardiogenic shock ([place]) 09/21/2016   Chronic combined systolic and diastolic heart failure ([place]) 09/21/2016   Coronary artery disease involving native coronary artery of native heart without angina pectoris 09/21/2016   a - Inf and RV infarct (STEMI) 1/18: LHC - pLAD, oD1 80, pRI 70, oLCx 30, mLCx 70, RCA 100, EF 45-50 >> PCI:  2.5 x 20 mm Promus DES to pRCA // Staged PCI 09/22/16: 3 x 38 mm Promus DES to mLAD; Attempted POBA to oD2 (80 >> 80); 3 x 28 mm Promus DES to RI   Enlarged prostate    History of ST elevation myocardial infarction (STEMI) 09/21/2016   Hyperlipidemia 09/29/2016   Hyperlipidemia 09/29/2016   Hypertension    Ischemic cardiomyopathy 09/23/2016   a - s/p Inf and RV infarct (STEMI) 1/18 // b - Echo 09/23/16: EF 35-40, inf-septal HK, apical septal and apical AK, ant and inf AK, ant-septal AK, Gr 1 diastolic dysfunction // c -Echo 5/18: mild conc LVH, EF 35-40, Gr 1  DD, inf-septal, ant-septal, ant, apical AK   Noncompliance with medication regimen    Presence of drug coated stent in right coronary artery 09/21/2016   Prostate cancer ([place])    S/P angioplasty with stent, LAD and Ramus with DES 09/22/16 09/23/2016   STEMI involving oth coronary artery of inferior wall ([place]) 09/21/2016    Current Medications: Current Meds  Medication Sig   aspirin EC 81 MG EC tablet Take 1 tablet (81 mg total) by mouth daily.   famotidine (PEPCID) 20 MG tablet TAKE 1 TABLET(20 MG) BY MOUTH TWICE DAILY   meloxicam (MOBIC) 7.5 MG tablet TAKE 1 TABLET(7.5 MG) BY MOUTH DAILY   nitroGLYCERIN (NITROSTAT) 0.4 MG SL tablet PLACE 1 TABLET UNDER TONGUE EVERY 5 MINUTES FOR 3 DOSES AS NEEDED FOR CHEST PAIN   tamsulosin  (FLOMAX) 0.4 MG CAPS capsule Take 0.4 mg by mouth at bedtime.   [DISCONTINUED] atorvastatin (LIPITOR) 80 MG tablet Take 1 tablet (80 mg total) by mouth every evening.   [DISCONTINUED] carvedilol (COREG) 6.25 MG tablet Take 1 tablet (6.25 mg total) by mouth in the morning and at bedtime.   [DISCONTINUED] ezetimibe (ZETIA) 10 MG tablet Take 1 tablet (10 mg total) by mouth daily.   [DISCONTINUED] sacubitril-valsartan (ENTRESTO) 97-103 MG Take 1 tablet by mouth 2 (two) times daily.     Allergies:   Patient has no known allergies.   Social History   Tobacco Use   Smoking status: Never Smoker   Smokeless tobacco: Never Used  Vaping Use   Vaping Use: Never used  Substance Use Topics   Alcohol use: No   Drug use: No     Family Hx: The patient's family history includes Heart attack in his father; Other in his mother. There is no history of Colon cancer, Liver cancer, Stomach cancer, Rectal cancer, or Esophageal cancer.  ROS   EKGs/Labs/Other Test Reviewed:    EKG:  EKG is  ordered today.  The ekg ordered today demonstrates normal sinus rhythm, HR, 72, normal axis, TW inverstions V3-4, QTc 416 ms, no change from prior ECG  Recent Labs: 04/10/2020: ALT 25 06/08/2020: BUN 8; Creatinine, Ser 1.01; Potassium 4.0; Sodium 141   Recent Lipid Panel Lab Results  Component Value Date/Time   CHOL 130 04/10/2020 09:25 AM   TRIG 52 04/10/2020 09:25 AM   HDL 54 04/10/2020 09:25 AM   CHOLHDL 2.4 04/10/2020 09:25 AM   CHOLHDL 3.6 09/21/2016 03:46 AM   LDLCALC 64 04/10/2020 09:25 AM      Risk Assessment/Calculations:      Physical Exam:    VS:  BP 140/80    Pulse 72    Ht 5\' 8"  (1.727 m)    Wt 181 lb 9.6 oz (82.4 kg)    SpO2 98%    BMI 27.61 kg/m     Wt Readings from Last 3 Encounters:  10/30/20 181 lb 9.6 oz (82.4 kg)  06/08/20 178 lb 14.4 oz (81.1 kg)  05/22/20 176 lb (79.8 kg)     Constitutional:      Appearance: Healthy appearance. Not in distress.  Neck:      Vascular: JVD normal.  Pulmonary:     Effort: Pulmonary effort is normal.     Breath sounds: No wheezing. No rales.  Cardiovascular:     Normal rate. Regular rhythm. Normal S1. Normal S2.     Murmurs: There is no murmur.  Edema:    Peripheral edema absent.  Abdominal:     Palpations: Abdomen is  soft.  Skin:    General: Skin is warm and dry.  Neurological:     General: No focal deficit present.     Mental Status: Alert and oriented to person, place and time.     Cranial Nerves: Cranial nerves are intact.       ASSESSMENT & PLAN:    1. 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  Most recent echocardiogram in 10/21 demonstrated improved LV function with an EF of 40-45.  He is NYHA I-II.  He has recently been off of his medications due to a change in his insurance.  He now has coverage.  I have asked him to resume Entresto and carvedilol.  We will plan on follow-up in 6 weeks to recheck his blood pressure.  2. Coronary artery disease involving native coronary artery of native heart without angina pectoris History of inferior MI in 1/18 treated with a DES to the RCA and staged PCI with DES to LAD and DES to the RI.  Attempted angioplasty to D2 was unsuccessful.  Myoview in 6/20 without ischemia.  Is doing well without anginal symptoms.  Continue aspirin, atorvastatin, carvedilol.  3. Essential hypertension Blood pressure above target.  He has been off of carvedilol and Entresto for almost a week.    4. Mixed hyperlipidemia LDL optimal on most recent lab work.  Continue current Rx.       Dispo:  Return in about 6 weeks (around 12/11/2020) for Routine Follow Up, w/ [name], PA-C, in person.   Medication Adjustments/Labs and Tests Ordered: Current medicines are reviewed at length with the patient today.  Concerns regarding medicines are outlined above.  Tests Ordered: Orders Placed This Encounter  Procedures   EKG 12-Lead   Medication  Changes: Meds ordered this encounter  Medications   DISCONTD: atorvastatin (LIPITOR) 80 MG tablet    Sig: Take 1 tablet (80 mg total) by mouth every evening.    Dispense:  30 tablet    Refill:  11   DISCONTD: carvedilol (COREG) 6.25 MG tablet    Sig: Take 1 tablet (6.25 mg total) by mouth in the morning and at bedtime.    Dispense:  60 tablet    Refill:  11   DISCONTD: ezetimibe (ZETIA) 10 MG tablet    Sig: Take 1 tablet (10 mg total) by mouth daily.    Dispense:  30 tablet    Refill:  11   DISCONTD: sacubitril-valsartan (ENTRESTO) 97-103 MG    Sig: Take 1 tablet by mouth 2 (two) times daily.    Dispense:  60 tablet    Refill:  11    Dose increase   sacubitril-valsartan (ENTRESTO) 97-103 MG    Sig: Take 1 tablet by mouth 2 (two) times daily.    Dispense:  180 tablet    Refill:  3    Dose increase   ezetimibe (ZETIA) 10 MG tablet    Sig: Take 1 tablet (10 mg total) by mouth daily.    Dispense:  90 tablet    Refill:  3   carvedilol (COREG) 6.25 MG tablet    Sig: Take 1 tablet (6.25 mg total) by mouth in the morning and at bedtime.    Dispense:  180 tablet    Refill:  3   atorvastatin (LIPITOR) 80 MG tablet    Sig: Take 1 tablet (80 mg total) by mouth every evening.    Dispense:  90 tablet    Refill:  1  Signed, [name], PA-C  10/30/2020 1:02 PM    [facility] [address], [place], [place]  [address] Phone: [phone]; Fax: [phone]

## 2020-10-30 ENCOUNTER — Ambulatory Visit: Payer: 59 | Admitting: Physician Assistant

## 2020-10-30 ENCOUNTER — Other Ambulatory Visit: Payer: Self-pay

## 2020-10-30 ENCOUNTER — Encounter: Payer: Self-pay | Admitting: Physician Assistant

## 2020-10-30 VITALS — BP 140/80 | HR 72 | Ht 68.0 in | Wt 181.6 lb

## 2020-10-30 DIAGNOSIS — I10 Essential (primary) hypertension: Secondary | ICD-10-CM

## 2020-10-30 DIAGNOSIS — I5020 Unspecified systolic (congestive) heart failure: Secondary | ICD-10-CM

## 2020-10-30 DIAGNOSIS — I2510 Atherosclerotic heart disease of native coronary artery without angina pectoris: Secondary | ICD-10-CM | POA: Diagnosis not present

## 2020-10-30 DIAGNOSIS — E782 Mixed hyperlipidemia: Secondary | ICD-10-CM

## 2020-10-30 MED ORDER — CARVEDILOL 6.25 MG PO TABS: 6.2500 mg | ORAL_TABLET | Freq: Two times a day (BID) | ORAL | 11 refills | Status: DC

## 2020-10-30 MED ORDER — CARVEDILOL 6.25 MG PO TABS: 6.2500 mg | ORAL_TABLET | Freq: Two times a day (BID) | ORAL | 3 refills | Status: DC

## 2020-10-30 MED ORDER — EZETIMIBE 10 MG PO TABS: 10.0000 mg | ORAL_TABLET | Freq: Every day | ORAL | 3 refills | Status: DC

## 2020-10-30 MED ORDER — ATORVASTATIN CALCIUM 80 MG PO TABS: 80.0000 mg | ORAL_TABLET | Freq: Every evening | ORAL | 11 refills | Status: DC

## 2020-10-30 MED ORDER — ENTRESTO 97-103 MG PO TABS: 1.0000 | ORAL_TABLET | Freq: Two times a day (BID) | ORAL | 11 refills | Status: DC

## 2020-10-30 MED ORDER — ENTRESTO 97-103 MG PO TABS: 1.0000 | ORAL_TABLET | Freq: Two times a day (BID) | ORAL | 3 refills | Status: DC

## 2020-10-30 MED ORDER — ATORVASTATIN CALCIUM 80 MG PO TABS: 80.0000 mg | ORAL_TABLET | Freq: Every evening | ORAL | 1 refills | Status: DC

## 2020-10-30 MED ORDER — EZETIMIBE 10 MG PO TABS: 10.0000 mg | ORAL_TABLET | Freq: Every day | ORAL | 11 refills | Status: DC

## 2020-10-30 NOTE — Patient Instructions (Signed)
Medication Instructions:  Your physician recommends that you continue on your current medications as directed. Please refer to the Current Medication list given to you today.  *If you need a refill on your cardiac medications before your next appointment, please call your pharmacy*   Lab Work: -None  If you have labs (blood work) drawn today and your tests are completely normal, you will receive your results only by: Marland Kitchen MyChart Message (if you have MyChart) OR . A paper copy in the mail If you have any lab test that is abnormal or we need to change your treatment, we will call you to review the results.   Testing/Procedures: -None   Follow-Up: At [facility], you and your health needs are our priority.  As part of our continuing mission to provide you with exceptional heart care, we have created designated Provider Care Teams.  These Care Teams include your primary Cardiologist (physician) and Advanced Practice Providers (APPs -  Physician Assistants and Nurse Practitioners) who all work together to provide you with the care you need, when you need it.  We recommend signing up for the patient portal called "MyChart".  Sign up information is provided on this After Visit Summary.  MyChart is used to connect with patients for Virtual Visits (Telemedicine).  Patients are able to view lab/test results, encounter notes, upcoming appointments, etc.  Non-urgent messages can be sent to your provider as well.   To learn more about what you can do with MyChart, go to [email or web address].    Your next appointment:   6 week(s)  The format for your next appointment:   In Person  Provider:   You will see one of the following Advanced Practice Providers on your designated Care Team:    [name], PA-C      Other Instructions -None

## 2020-11-03 ENCOUNTER — Other Ambulatory Visit: Payer: Self-pay

## 2020-11-03 ENCOUNTER — Ambulatory Visit
Admission: RE | Admit: 2020-11-03 | Discharge: 2020-11-03 | Disposition: A | Discharge status: Home / Self Care | Payer: 59 | Source: Ambulatory Visit | Attending: Radiation Oncology | Admitting: Radiation Oncology

## 2020-11-03 ENCOUNTER — Telehealth: Payer: Self-pay | Admitting: Radiation Oncology

## 2020-11-03 ENCOUNTER — Encounter: Payer: Self-pay | Admitting: Radiation Oncology

## 2020-11-03 ENCOUNTER — Encounter: Payer: Self-pay | Admitting: Medical Oncology

## 2020-11-03 VITALS — BP 140/93 | HR 61 | Temp 96.8°F | Resp 18 | Ht 68.0 in | Wt 178.1 lb

## 2020-11-03 DIAGNOSIS — I5042 Chronic combined systolic (congestive) and diastolic (congestive) heart failure: Secondary | ICD-10-CM | POA: Insufficient documentation

## 2020-11-03 DIAGNOSIS — Z7982 Long term (current) use of aspirin: Secondary | ICD-10-CM | POA: Insufficient documentation

## 2020-11-03 DIAGNOSIS — I252 Old myocardial infarction: Secondary | ICD-10-CM | POA: Diagnosis not present

## 2020-11-03 DIAGNOSIS — I2510 Atherosclerotic heart disease of native coronary artery without angina pectoris: Secondary | ICD-10-CM | POA: Diagnosis not present

## 2020-11-03 DIAGNOSIS — C61 Malignant neoplasm of prostate: Secondary | ICD-10-CM | POA: Diagnosis present

## 2020-11-03 DIAGNOSIS — I110 Hypertensive heart disease with heart failure: Secondary | ICD-10-CM | POA: Insufficient documentation

## 2020-11-03 DIAGNOSIS — Z79899 Other long term (current) drug therapy: Secondary | ICD-10-CM | POA: Insufficient documentation

## 2020-11-03 DIAGNOSIS — E785 Hyperlipidemia, unspecified: Secondary | ICD-10-CM | POA: Insufficient documentation

## 2020-11-03 NOTE — Progress Notes (Signed)
CANCELLED CONSULT WITH DR. MANNING A SECOND TIME. THIS IS THE THIRD ATTEMPT TO CONSULT PATIENT. PATIENT EXPLAINS HE HAS BEEN WITHOUT HEALTH INSURANCE  GU Location of Tumor / Histology: prostatic adenocarcinoma  If Prostate Cancer, Gleason Score is (3 + 4) and PSA is (6.49).  Prostate volume: 25.67 grams  Randy Moore presented as a referral from his PCP for further evaluation of an elevated PSA of 4.1 in August 2021.  Biopsies of prostate (if applicable) revealed:   Past/Anticipated interventions by urology, if any: prostate biopsy, discussion reference treatment options, patient not interest in radical prostatectomy, referred to Dr. Tammi Klippel to discuss radiation options  Past/Anticipated interventions by medical oncology, if any: no  Weight changes, if any:   Bowel/Bladder complaints, if any: IPSS 13. SHIM 14. Denies dysuria or hematuria. Reports occasional urinary leakage. Reports urinary frequency and nocturia x 2. Started on Tamsulosin by Dr. Claudia Desanctis.   Nausea/Vomiting, if any: no  Pain issues, if any:  Reports new onset pain base of neck.   SAFETY ISSUES:  Prior radiation? denies  Pacemaker/ICD? Denies pacemaker but has stents  Possible current pregnancy? no, male patient  Is the patient on methotrexate? denies  Current Complaints / other details:  55 year old male. Married.

## 2020-11-03 NOTE — Telephone Encounter (Signed)
Patient hasn't shown for appointment. Phoned to inquire. Patient reports he is in the building. Will send staff to bring him down for his consult.

## 2020-11-03 NOTE — Progress Notes (Signed)
Radiation Oncology         [phone]) [phone] ________________________________  Initial outpatient Consultation  Name: Randy Moore MRN: [id number]  Date: 11/03/2020  DOB: [birth year]  YC:XKGY, [name], PA-C  Pace, Johny Chess, MD   REFERRING PHYSICIAN: Robley Fries, MD  DIAGNOSIS: 55 y.o. gentleman with Stage T1c adenocarcinoma of the prostate with Gleason score of 3+4, and PSA of 6.49.    ICD-10-CM   1. Malignant neoplasm of prostate ([place])  C61 Ambulatory referral to Social Work    HISTORY OF PRESENT ILLNESS: Randy Moore is a 55 y.o. male with a diagnosis of prostate cancer. He was noted to have an elevated PSA of 4.1 by his primary care provider, [employer], PA-C.  Accordingly, he was referred for evaluation in urology with Dr. Claudia Desanctis on 04/03/20,  digital rectal examination was performed at that time revealing no nodules. Repeat PSA performed that same day showed further elevation to 6.49. The patient proceeded to transrectal ultrasound with 12 biopsies of the prostate on 05/01/20.  The prostate volume measured 25.67 cc.  Out of 12 core biopsies, 5 were positive.  The maximum Gleason score was 3+4, and this was seen in the left apex (with PNI). Additionally, Gleason 3+3 was seen in the left base lateral (small foci), left apex lateral (two small foci), right apex (two small foci), and right apex lateral.  Of note, he was scheduled for consultation two times prior in November 2021 and in February 2022 but had to cancel for various reasons.  The patient reviewed the biopsy results with his urologist and he has kindly been referred today for discussion of potential radiation treatment options.   PREVIOUS RADIATION THERAPY: No  PAST MEDICAL HISTORY:  Past Medical History:  Diagnosis Date  . Acute right ventricular myocardial infarction ([place]) 09/21/2016  . Cardiogenic shock ([place]) 09/21/2016  . Chronic combined systolic and diastolic heart failure ([place]) 09/21/2016  . Coronary artery disease  involving native coronary artery of native heart without angina pectoris 09/21/2016   a - Inf and RV infarct (STEMI) 1/18: LHC - pLAD, oD1 80, pRI 70, oLCx 30, mLCx 70, RCA 100, EF 45-50 >> PCI:  2.5 x 20 mm Promus DES to pRCA // Staged PCI 09/22/16: 3 x 38 mm Promus DES to mLAD; Attempted POBA to oD2 (80 >> 80); 3 x 28 mm Promus DES to RI  . Enlarged prostate   . History of ST elevation myocardial infarction (STEMI) 09/21/2016  . Hyperlipidemia 09/29/2016  . Hyperlipidemia 09/29/2016  . Hypertension   . Ischemic cardiomyopathy 09/23/2016   a - s/p Inf and RV infarct (STEMI) 1/18 // b - Echo 09/23/16: EF 35-40, inf-septal HK, apical septal and apical AK, ant and inf AK, ant-septal AK, Gr 1 diastolic dysfunction // c -Echo 5/18: mild conc LVH, EF 35-40, Gr 1 DD, inf-septal, ant-septal, ant, apical AK  . Noncompliance with medication regimen   . Presence of drug coated stent in right coronary artery 09/21/2016  . Prostate cancer ([place])   . S/P angioplasty with stent, LAD and Ramus with DES 09/22/16 09/23/2016  . STEMI involving oth coronary artery of inferior wall ([place]) 09/21/2016      PAST SURGICAL HISTORY: Past Surgical History:  Procedure Laterality Date  . CARDIAC CATHETERIZATION N/A 09/21/2016   Procedure: Left Heart Cath and Coronary Angiography;  Surgeon: Sherren Mocha, MD;  Location: [facility];  Service: Cardiovascular;  Laterality: N/A;  . CARDIAC CATHETERIZATION N/A 09/21/2016   Procedure: Coronary Stent Intervention;  Surgeon: Sherren Mocha, MD;  Location: [facility];  Service: Cardiovascular;  Laterality: N/A;  Prox RCA  . CARDIAC CATHETERIZATION N/A 09/22/2016   Procedure: Coronary CTO Intervention;  Surgeon: Burnell Blanks, MD;  Location: [facility];  Service: Cardiovascular;  Laterality: N/A;  . COLONOSCOPY WITH PROPOFOL N/A 12/16/2019   Procedure: COLONOSCOPY WITH PROPOFOL;  Surgeon: Mauri Pole, MD;  Location: [facility];  Service: Endoscopy;   Laterality: N/A;  . POLYPECTOMY  12/16/2019   Procedure: POLYPECTOMY;  Surgeon: Mauri Pole, MD;  Location: [facility];  Service: Endoscopy;;  . PROSTATE BIOPSY      FAMILY HISTORY:  Family History  Problem Relation Age of Onset  . Other Mother        chest pains  . Heart attack Father   . Colon cancer Neg Hx   . Liver cancer Neg Hx   . Stomach cancer Neg Hx   . Rectal cancer Neg Hx   . Esophageal cancer Neg Hx     SOCIAL HISTORY: The patient is married but is living separately from his wife who is currently living in [place], their home country. Social History   Socioeconomic History  . Marital status: Married    Spouse name: Not on file  . Number of children: 4  . Years of education: Not on file  . Highest education level: Not on file  Occupational History  . Occupation:  [employer]  Tobacco Use  . Smoking status: Never Smoker  . Smokeless tobacco: Never Used  Vaping Use  . Vaping Use: Never used  Substance and Sexual Activity  . Alcohol use: No  . Drug use: No  . Sexual activity: Yes  Other Topics Concern  . Not on file  Social History Narrative  . Not on file   Social Determinants of Health   Financial Resource Strain: Not on file  Food Insecurity: Not on file  Transportation Needs: Not on file  Physical Activity: Not on file  Stress: Not on file  Social Connections: Not on file  Intimate Partner Violence: Not on file    ALLERGIES: Patient has no known allergies.  MEDICATIONS:  Current Outpatient Medications  Medication Sig Dispense Refill  . aspirin EC 81 MG EC tablet Take 1 tablet (81 mg total) by mouth daily.    Marland Kitchen atorvastatin (LIPITOR) 80 MG tablet Take 1 tablet (80 mg total) by mouth every evening. 90 tablet 1  . carvedilol (COREG) 6.25 MG tablet Take 1 tablet (6.25 mg total) by mouth in the morning and at bedtime. 180 tablet 3  . ezetimibe (ZETIA) 10 MG tablet Take 1 tablet (10 mg total) by mouth daily. 90 tablet 3  .  famotidine (PEPCID) 20 MG tablet TAKE 1 TABLET(20 MG) BY MOUTH TWICE DAILY 60 tablet 5  . meloxicam (MOBIC) 7.5 MG tablet TAKE 1 TABLET(7.5 MG) BY MOUTH DAILY 30 tablet 0  . sacubitril-valsartan (ENTRESTO) 97-103 MG Take 1 tablet by mouth 2 (two) times daily. 180 tablet 3  . tamsulosin (FLOMAX) 0.4 MG CAPS capsule Take 0.4 mg by mouth at bedtime.    . nitroGLYCERIN (NITROSTAT) 0.4 MG SL tablet PLACE 1 TABLET UNDER TONGUE EVERY 5 MINUTES FOR 3 DOSES AS NEEDED FOR CHEST PAIN (Patient not taking: Reported on 11/03/2020) 25 tablet 4   No current facility-administered medications for this encounter.    REVIEW OF SYSTEMS:  On review of systems, the patient reports that he is doing well overall. He denies any  chest pain, shortness of breath, cough, fevers, chills, night sweats, unintended weight changes. He denies any bowel disturbances, and denies abdominal pain, nausea or vomiting. He denies any new musculoskeletal or joint aches or pains. His IPSS was 13, indicating moderate urinary symptoms. He reports occasional urinary leakage, urinary frequency, and nocturia x2. He endorses taking tamsulosin. His SHIM was 14, indicating he has moderate erectile dysfunction. A complete review of systems is obtained and is otherwise negative.    PHYSICAL EXAM:  Wt Readings from Last 3 Encounters:  11/03/20 178 lb 2 oz (80.8 kg)  10/30/20 181 lb 9.6 oz (82.4 kg)  06/08/20 178 lb 14.4 oz (81.1 kg)   Temp Readings from Last 3 Encounters:  11/03/20 (!) 96.8 F (36 C) (Temporal)  01/24/20 (!) 97.4 F (36.3 C)  12/16/19 (!) 97.5 F (36.4 C) (Axillary)   BP Readings from Last 3 Encounters:  11/03/20 (!) 140/93  10/30/20 140/80  06/08/20 122/70   Pulse Readings from Last 3 Encounters:  11/03/20 61  10/30/20 72  06/08/20 71   Pain Assessment Pain Score: 0-No pain/10  In general this is a well appearing [place] male in no acute distress. He's alert and oriented x4 and appropriate throughout the  examination. Cardiopulmonary assessment is negative for acute distress, and he exhibits normal effort.     KPS = 100  100 - Normal; no complaints; no evidence of disease. 90   - Able to carry on normal activity; minor signs or symptoms of disease. 80   - Normal activity with effort; some signs or symptoms of disease. 45   - Cares for self; unable to carry on normal activity or to do active work. 60   - Requires occasional assistance, but is able to care for most of his personal needs. 50   - Requires considerable assistance and frequent medical care. 69   - Disabled; requires special care and assistance. 35   - Severely disabled; hospital admission is indicated although death not imminent. 8   - Very sick; hospital admission necessary; active supportive treatment necessary. 10   - Moribund; fatal processes progressing rapidly. 0     - Dead  Karnofsky DA, Abelmann [place], Craver LS and Burchenal [facility] [phone]) The use of the nitrogen mustards in the palliative treatment of carcinoma: with particular reference to bronchogenic carcinoma Cancer 1 634-56  LABORATORY DATA:  Lab Results  Component Value Date   WBC 4.2 12/13/2017   HGB 14.5 12/13/2017   HCT 42.7 12/13/2017   MCV 81.8 12/13/2017   PLT 163 12/13/2017   Lab Results  Component Value Date   NA 141 06/08/2020   K 4.0 06/08/2020   CL 102 06/08/2020   CO2 29 06/08/2020   Lab Results  Component Value Date   ALT 25 04/10/2020   AST 25 04/10/2020   ALKPHOS 72 04/10/2020   BILITOT 0.8 04/10/2020     RADIOGRAPHY: No results found.    IMPRESSION/PLAN: 1. 55 y.o. gentleman with Stage T1c adenocarcinoma of the prostate with Gleason Score of 3+4, and PSA of 6.49. We discussed the patient's workup and outlined the nature of prostate cancer in this setting. The patient's T stage, Gleason's score, and PSA put him into the favorable intermediate risk group. Accordingly, he is eligible for a variety of potential treatment options including  brachytherapy, 5.5 weeks of external radiation, or prostatectomy.  The patient reports that he is adamantly not interested in prostatectomy.  Therefore, we discussed the available radiation techniques, and  focused on the details and logistics of delivery. We discussed and outlined the risks, benefits, short and long-term effects associated with radiotherapy and compared and contrasted these with prostatectomy. We discussed the role of SpaceOAR gel in reducing the rectal toxicity associated with radiotherapy.  He appears to have a good understanding of his disease and our treatment recommendations which are of curative intent.  He was encouraged to ask questions that were answered to his stated satisfaction.  At the conclusion of our conversation, the patient is interested in moving forward with brachytherapy and use of SpaceOAR gel to reduce rectal toxicity from radiotherapy.  We will share our discussion with Dr. Claudia Desanctis and move forward with scheduling his [facility] planning appointment in the near future.  The patient met briefly with Romie Jumper in our office who will be working closely with him to coordinate OR scheduling and pre and post procedure appointments.  We will contact the pharmaceutical rep to ensure that [employer] is available at the time of procedure.  We enjoyed meeting him today and look forward to continuing to participate in his care.    Nicholos Johns, PA-C    Tyler Pita, MD  [facility] Direct Dial: [phone]  Fax: [phone] [email or web address]  Skype  LinkedIn   This document serves as a record of services personally performed by Tyler Pita, MD and Freeman Caldron, PA-C. It was created on their behalf by Wilburn Mylar, a trained medical scribe. The creation of this record is based on the scribe's personal observations and the provider's statements to them. This document has been checked and approved by the attending provider.

## 2020-11-04 ENCOUNTER — Encounter: Payer: Self-pay | Admitting: Licensed Clinical Social Worker

## 2020-11-04 NOTE — Progress Notes (Signed)
[facility] Psychosocial Distress Screening Clinical Social Work  Clinical Social Work was referred by distress screening protocol.  The patient scored a 8 on the Psychosocial Distress Thermometer which indicates severe distress. Clinical Social Worker contacted patient by phone to assess for distress and other psychosocial needs.   Patient expressed having good support from family and church community. He is working and has income from employment. Main stressor right now is an issue with utility company stating they didn't receive a payment. He is working on this with them. CSW also gave information on [employer] emergency assistance program as well as contact information in case patient needs to access it.  ONCBCN DISTRESS SCREENING 11/03/2020  Screening Type Initial Screening  Distress experienced in past week (1-10) 8  Practical problem type Work/school  Family Problem type Children  Emotional problem type Boredom  Spiritual/Religous concerns type Loss of sense of purpose  Information Concerns Type Lack of info about treatment  Physician notified of physical symptoms Yes  Referral to clinical psychology No  Referral to clinical social work Yes  Referral to dietition No  Referral to financial advocate No  Referral to support programs Yes  Referral to palliative care No    Clinical Social Worker follow up needed: No. Patient will call as needed  If yes, follow up plan:  MICHELLE E ZAVALA, LCSW

## 2020-11-06 ENCOUNTER — Telehealth: Payer: Self-pay | Admitting: *Deleted

## 2020-11-06 NOTE — Telephone Encounter (Signed)
CALLED PATIENT TO INFORM OF PRE-SEED APPTS. FOR 11-24-20 AND HIS IMPLANT ON 12-29-20, SPOKE WITH PATIENT AND HE IS AWARE OF THESE APPTS.

## 2020-11-09 ENCOUNTER — Other Ambulatory Visit: Payer: Self-pay | Admitting: Urology

## 2020-11-23 ENCOUNTER — Telehealth: Payer: Self-pay | Admitting: *Deleted

## 2020-11-23 NOTE — Telephone Encounter (Signed)
CALLED PATIENT TO REMIND OF PRE-SEED APPTS. FOR 11-24-20, SPOKE WITH PATIENT AND HE IS AWARE OF THESE APPTS.

## 2020-11-24 ENCOUNTER — Ambulatory Visit: Payer: 59 | Admitting: Urology

## 2020-11-24 ENCOUNTER — Encounter (HOSPITAL_COMMUNITY)
Admission: RE | Admit: 2020-11-24 | Discharge: 2020-11-24 | Disposition: A | Discharge status: Home / Self Care | Payer: 59 | Source: Ambulatory Visit | Attending: Urology | Admitting: Urology

## 2020-11-24 ENCOUNTER — Ambulatory Visit: Payer: 59 | Admitting: Radiation Oncology

## 2020-11-24 ENCOUNTER — Other Ambulatory Visit: Payer: Self-pay

## 2020-11-24 ENCOUNTER — Ambulatory Visit (HOSPITAL_COMMUNITY)
Admission: RE | Admit: 2020-11-24 | Discharge: 2020-11-24 | Disposition: A | Discharge status: Home / Self Care | Payer: 59 | Source: Ambulatory Visit | Attending: Urology | Admitting: Urology

## 2020-11-24 ENCOUNTER — Ambulatory Visit
Admission: RE | Admit: 2020-11-24 | Discharge: 2020-11-24 | Disposition: A | Discharge status: Home / Self Care | Payer: 59 | Source: Ambulatory Visit | Attending: Radiation Oncology | Admitting: Radiation Oncology

## 2020-11-24 DIAGNOSIS — Z01818 Encounter for other preprocedural examination: Secondary | ICD-10-CM

## 2020-11-24 DIAGNOSIS — C61 Malignant neoplasm of prostate: Secondary | ICD-10-CM | POA: Insufficient documentation

## 2020-11-24 NOTE — Progress Notes (Signed)
  Radiation Oncology         [phone]) [phone] ________________________________  Name: Randy Moore MRN: [id number]  Date: 11/24/2020  DOB: [birth year]  SIMULATION AND TREATMENT PLANNING NOTE PUBIC ARCH STUDY  VU:YEBX, Scott, PA-C  Jacalyn Lefevre D, MD  DIAGNOSIS:  55 y.o. gentleman with Stage T1c adenocarcinoma of the prostate with Gleason score of 3+4, and PSA of 6.49.  Oncology History  Malignant neoplasm of prostate ([place])  05/01/2020 Cancer Staging   Staging form: Prostate, AJCC 8th Edition - Clinical stage from 05/01/2020: Stage IIB (cT1c, cN0, cM0, PSA: 6.5, Grade Group: 2) - Signed by Freeman Caldron, PA-C on 11/03/2020 Histopathologic type: Adenocarcinoma, NOS Stage prefix: Initial diagnosis Prostate specific antigen (PSA) range: Less than 10 Gleason primary pattern: 3 Gleason secondary pattern: 4 Gleason score: 7 Histologic grading system: 5 grade system Number of biopsy cores examined: 12 Number of biopsy cores positive: 5 Location of positive needle core biopsies: Both sides   11/03/2020 Initial Diagnosis   Malignant neoplasm of prostate ([place])     No diagnosis found.  COMPLEX SIMULATION:  The patient presented today for evaluation for possible prostate seed implant. He was brought to the radiation planning suite and placed supine on the CT couch. A 3-dimensional image study set was obtained in upload to the planning computer. There, on each axial slice, I contoured the prostate gland. Then, using three-dimensional radiation planning tools I reconstructed the prostate in view of the structures from the transperineal needle pathway to assess for possible pubic arch interference. In doing so, I did not appreciate any pubic arch interference. Also, the patient's prostate volume was estimated based on the drawn structure. The volume was 27 cc.  Given the pubic arch appearance and prostate volume, patient remains a good candidate to proceed with prostate seed implant. Today, he freely  provided informed written consent to proceed.    PLAN: The patient will undergo prostate seed implant.   ________________________________  Sheral Apley. Tammi Klippel, M.D.    This document serves as a record of services personally performed by Tyler Pita, MD. It was created on his behalf by Wilburn Mylar, a trained medical scribe. The creation of this record is based on the scribe's personal observations and the provider's statements to them. This document has been checked and approved by the attending provider.

## 2020-12-17 ENCOUNTER — Ambulatory Visit: Payer: 59 | Admitting: Radiation Oncology

## 2020-12-17 ENCOUNTER — Ambulatory Visit: Payer: Self-pay | Admitting: Urology

## 2020-12-22 ENCOUNTER — Telehealth: Payer: Self-pay | Admitting: *Deleted

## 2020-12-22 NOTE — Telephone Encounter (Signed)
CALLED PATIENT TO REMIND OF LAB AND COVID TESTING FOR 12-28-20, LVM FOR A RETURN CALL

## 2020-12-23 ENCOUNTER — Other Ambulatory Visit: Payer: Self-pay

## 2020-12-23 ENCOUNTER — Encounter (HOSPITAL_BASED_OUTPATIENT_CLINIC_OR_DEPARTMENT_OTHER): Payer: Self-pay | Admitting: Urology

## 2020-12-23 NOTE — Progress Notes (Signed)
Spoke w/ via phone for pre-op interview---pt Lab needs dos----    Has lab appt 12-28-2020 900 for cbc cmp pt ptt           Lab results------see below COVID test ------12-28-2020 1000 Arrive at -------1100 am 12-29-2020 NPO after MN NO Solid Food.  Clear liquids from MN until---1000 am then npo Med rec completed Medications to take morning of surgery ----carvedilol, entresto- Diabetic medication -----n/a Patient instructed to bring photo id and insurance card day of surgery Patient aware to have Driver (ride ) / caregiver   [employer] is church member [employer], pt is aware caregiver needed for 24 hours after surgery and will arrange   for 24 hours after surgery  Patient Special Instructions -----fleets enema am of surgery Pre-Op special Istructions -----none Patient verbalized understanding of instructions that were given at this phone interview. Patient denies shortness of breath, chest pain, fever, cough at this phone interview.pt denies any recent nitro use and denies any cardiac S & S at pre op call  Chest xray 11-24-2020 epic Echo 05-29-2020 epic Gated stress test 02-01-2019 epic ekg 10-30-2020 epic lov chf scott weaver pa 10-30-2020 epic

## 2020-12-28 ENCOUNTER — Other Ambulatory Visit: Payer: Self-pay

## 2020-12-28 ENCOUNTER — Telehealth: Payer: Self-pay | Admitting: *Deleted

## 2020-12-28 ENCOUNTER — Other Ambulatory Visit (HOSPITAL_COMMUNITY)
Admission: RE | Admit: 2020-12-28 | Discharge: 2020-12-28 | Disposition: A | Discharge status: Home / Self Care | Payer: 59 | Source: Ambulatory Visit | Attending: Urology | Admitting: Urology

## 2020-12-28 ENCOUNTER — Encounter (HOSPITAL_COMMUNITY)
Admission: RE | Admit: 2020-12-28 | Discharge: 2020-12-28 | Disposition: A | Discharge status: Home / Self Care | Payer: 59 | Source: Ambulatory Visit | Attending: Urology | Admitting: Urology

## 2020-12-28 DIAGNOSIS — Z01812 Encounter for preprocedural laboratory examination: Secondary | ICD-10-CM | POA: Insufficient documentation

## 2020-12-28 DIAGNOSIS — Z20822 Contact with and (suspected) exposure to covid-19: Secondary | ICD-10-CM | POA: Insufficient documentation

## 2020-12-28 LAB — CBC
HCT: 45.8 % (ref 39.0–52.0)
Hemoglobin: 15.4 g/dL (ref 13.0–17.0)
MCH: 28.4 pg (ref 26.0–34.0)
MCHC: 33.6 g/dL (ref 30.0–36.0)
MCV: 84.3 fL (ref 80.0–100.0)
Platelets: 178 10*3/uL (ref 150–400)
RBC: 5.43 MIL/uL (ref 4.22–5.81)
RDW: 12.9 % (ref 11.5–15.5)
WBC: 5 10*3/uL (ref 4.0–10.5)
nRBC: 0 % (ref 0.0–0.2)

## 2020-12-28 LAB — COMPREHENSIVE METABOLIC PANEL
ALT: 23 U/L (ref 0–44)
AST: 21 U/L (ref 15–41)
Albumin: 3.8 g/dL (ref 3.5–5.0)
Alkaline Phosphatase: 60 U/L (ref 38–126)
Anion gap: 7 (ref 5–15)
BUN: 13 mg/dL (ref 6–20)
CO2: 28 mmol/L (ref 22–32)
Calcium: 9 mg/dL (ref 8.9–10.3)
Chloride: 106 mmol/L (ref 98–111)
Creatinine, Ser: 0.98 mg/dL (ref 0.61–1.24)
GFR, Estimated: >60 mL/min (ref >60)
Glucose, Bld: 113 mg/dL — ABNORMAL HIGH (ref 70–99)
Potassium: 4.3 mmol/L (ref 3.5–5.1)
Sodium: 141 mmol/L (ref 135–145)
Total Bilirubin: 1.5 mg/dL — ABNORMAL HIGH (ref 0.3–1.2)
Total Protein: 6.7 g/dL (ref 6.5–8.1)

## 2020-12-28 LAB — PROTIME-INR
INR: 1.3 — ABNORMAL HIGH (ref 0.8–1.2)
Prothrombin Time: 16.5 seconds — ABNORMAL HIGH (ref 11.4–15.2)

## 2020-12-28 LAB — APTT: aPTT: 32 seconds (ref 24–36)

## 2020-12-28 LAB — SARS CORONAVIRUS 2 (TAT 6-24 HRS): SARS Coronavirus 2: NEGATIVE

## 2020-12-28 NOTE — Telephone Encounter (Signed)
CALLED PATIENT TO REMIND OF PROCEDURE FOR 12-29-20, SPOKE WITH PATIENT AND HE IS AWARE OF THIS PROCEDURE

## 2020-12-28 NOTE — Progress Notes (Signed)
  Radiation Oncology         ([phone]) [phone] ________________________________  Name: Randy Moore MRN: [id number]  Date: 12/28/2020  DOB: 10/29/1965       Prostate Seed Implant  YT:KPTW, Scott, PA-C  No ref. provider found  DIAGNOSIS:   55 y.o. gentleman with Stage T1c adenocarcinoma of the prostate with Gleason score of 3+4, and PSA of 6.49.  Oncology History  Malignant neoplasm of prostate ([place])  05/01/2020 Cancer Staging   Staging form: Prostate, AJCC 8th Edition - Clinical stage from 05/01/2020: Stage IIB (cT1c, cN0, cM0, PSA: 6.5, Grade Group: 2) - Signed by Freeman Caldron, PA-C on 11/03/2020 Histopathologic type: Adenocarcinoma, NOS Stage prefix: Initial diagnosis Prostate specific antigen (PSA) range: Less than 10 Gleason primary pattern: 3 Gleason secondary pattern: 4 Gleason score: 7 Histologic grading system: 5 grade system Number of biopsy cores examined: 12 Number of biopsy cores positive: 5 Location of positive needle core biopsies: Both sides   11/03/2020 Initial Diagnosis   Malignant neoplasm of prostate ([place])     No diagnosis found.  PROCEDURE: Insertion of radioactive I-125 seeds into the prostate gland.  RADIATION DOSE: 145 Gy, definitive therapy.  TECHNIQUE: Randy Moore was brought to the operating room with the urologist. He was placed in the dorsolithotomy position. He was catheterized and a rectal tube was inserted. The perineum was shaved, prepped and draped. The ultrasound probe was then introduced into the rectum to see the prostate gland.  TREATMENT DEVICE: A needle grid was attached to the ultrasound probe stand and anchor needles were placed.  3D PLANNING: The prostate was imaged in 3D using a sagittal sweep of the prostate probe. These images were transferred to the planning computer. There, the prostate, urethra and rectum were defined on each axial reconstructed image. Then, the software created an optimized 3D plan and a few seed positions were  adjusted. The quality of the plan was reviewed using [facility] information for the target and the following two organs at risk:  Urethra and Rectum.  Then the accepted plan was printed and handed off to the radiation therapist.  Under my supervision, the custom loading of the seeds and spacers was carried out and loaded into sealed vicryl sleeves.  These pre-loaded needles were then placed into the needle holder.Marland Kitchen  PROSTATE VOLUME STUDY:  Using transrectal ultrasound the volume of the prostate was verified to be 33.5 cc.  SPECIAL TREATMENT PROCEDURE/SUPERVISION AND HANDLING: The pre-loaded needles were then delivered under sagittal guidance. A total of 20 needles were used to deposit 77 seeds in the prostate gland. The individual seed activity was 0.360 mCi.  SpaceOAR:  Yes  COMPLEX SIMULATION: At the end of the procedure, an anterior radiograph of the pelvis was obtained to document seed positioning and count. Cystoscopy was performed to check the urethra and bladder.  MICRODOSIMETRY: At the end of the procedure, the patient was emitting 0.052 mR/hr at 1 meter. Accordingly, he was considered safe for hospital discharge.  PLAN: The patient will return to the radiation oncology clinic for post implant CT dosimetry in three weeks.   ________________________________  Sheral Apley Tammi Klippel, M.D.

## 2020-12-28 NOTE — Progress Notes (Signed)
Reviewed preop instructions with patient

## 2020-12-29 ENCOUNTER — Ambulatory Visit (HOSPITAL_BASED_OUTPATIENT_CLINIC_OR_DEPARTMENT_OTHER)
Admission: RE | Admit: 2020-12-29 | Discharge: 2020-12-29 | Disposition: A | Discharge status: Home / Self Care | Payer: 59 | Source: Other Acute Inpatient Hospital | Attending: Urology | Admitting: Urology

## 2020-12-29 ENCOUNTER — Other Ambulatory Visit: Payer: Self-pay

## 2020-12-29 ENCOUNTER — Ambulatory Visit (HOSPITAL_BASED_OUTPATIENT_CLINIC_OR_DEPARTMENT_OTHER): Payer: 59 | Admitting: Anesthesiology

## 2020-12-29 ENCOUNTER — Encounter (HOSPITAL_BASED_OUTPATIENT_CLINIC_OR_DEPARTMENT_OTHER)
Admission: RE | Disposition: A | Discharge status: Home / Self Care | Payer: Self-pay | Source: Other Acute Inpatient Hospital | Attending: Urology

## 2020-12-29 ENCOUNTER — Ambulatory Visit (HOSPITAL_COMMUNITY): Payer: 59

## 2020-12-29 ENCOUNTER — Encounter (HOSPITAL_BASED_OUTPATIENT_CLINIC_OR_DEPARTMENT_OTHER): Payer: Self-pay | Admitting: Urology

## 2020-12-29 DIAGNOSIS — C61 Malignant neoplasm of prostate: Secondary | ICD-10-CM | POA: Diagnosis present

## 2020-12-29 DIAGNOSIS — Z79899 Other long term (current) drug therapy: Secondary | ICD-10-CM | POA: Diagnosis not present

## 2020-12-29 DIAGNOSIS — Z7982 Long term (current) use of aspirin: Secondary | ICD-10-CM | POA: Diagnosis not present

## 2020-12-29 HISTORY — PX: SPACE OAR INSTILLATION: SHX6769

## 2020-12-29 HISTORY — PX: RADIOACTIVE SEED IMPLANT: SHX5150

## 2020-12-29 SURGERY — INSERTION, RADIATION SOURCE, PROSTATE
Anesthesia: General | Site: Prostate

## 2020-12-29 MED ORDER — DEXAMETHASONE SODIUM PHOSPHATE 10 MG/ML IJ SOLN
INTRAMUSCULAR | Status: DC | PRN
  Administered 2020-12-29: 6 mg via INTRAVENOUS

## 2020-12-29 MED ORDER — MIDAZOLAM HCL 2 MG/2ML IJ SOLN
INTRAMUSCULAR | Status: AC
  Filled 2020-12-29: qty 2

## 2020-12-29 MED ORDER — TRAMADOL HCL 50 MG PO TABS: 50.0000 mg | ORAL_TABLET | Freq: Four times a day (QID) | ORAL | 0 refills | Status: AC | PRN

## 2020-12-29 MED ORDER — CEFAZOLIN SODIUM-DEXTROSE 2-4 GM/100ML-% IV SOLN
INTRAVENOUS | Status: AC
  Filled 2020-12-29: qty 100

## 2020-12-29 MED ORDER — SODIUM CHLORIDE 0.9 % IV SOLN
INTRAVENOUS | Status: AC | PRN
  Administered 2020-12-29: 100 mL

## 2020-12-29 MED ORDER — PROMETHAZINE HCL 25 MG/ML IJ SOLN: 6.2500 mg | INTRAMUSCULAR | Status: DC | PRN

## 2020-12-29 MED ORDER — FENTANYL CITRATE (PF) 100 MCG/2ML IJ SOLN
INTRAMUSCULAR | Status: AC
  Filled 2020-12-29: qty 2

## 2020-12-29 MED ORDER — CEFAZOLIN SODIUM-DEXTROSE 2-4 GM/100ML-% IV SOLN
2.0000 g | Freq: Once | INTRAVENOUS | Status: AC
  Administered 2020-12-29: 2 g via INTRAVENOUS

## 2020-12-29 MED ORDER — DEXAMETHASONE SODIUM PHOSPHATE 10 MG/ML IJ SOLN
INTRAMUSCULAR | Status: AC
  Filled 2020-12-29: qty 1

## 2020-12-29 MED ORDER — LACTATED RINGERS IV SOLN: INTRAVENOUS | Status: DC

## 2020-12-29 MED ORDER — PHENYLEPHRINE HCL-NACL 10-0.9 MG/250ML-% IV SOLN
INTRAVENOUS | Status: DC | PRN
  Administered 2020-12-29: 25 ug/min via INTRAVENOUS

## 2020-12-29 MED ORDER — SODIUM CHLORIDE (PF) 0.9 % IJ SOLN
INTRAMUSCULAR | Status: DC | PRN
  Administered 2020-12-29: 10 mL

## 2020-12-29 MED ORDER — FENTANYL CITRATE (PF) 100 MCG/2ML IJ SOLN
INTRAMUSCULAR | Status: DC | PRN
  Administered 2020-12-29: 50 ug via INTRAVENOUS

## 2020-12-29 MED ORDER — OXYCODONE HCL 5 MG/5ML PO SOLN: 5.0000 mg | Freq: Once | ORAL | Status: DC | PRN

## 2020-12-29 MED ORDER — ACETAMINOPHEN 500 MG PO TABS
1000.0000 mg | ORAL_TABLET | Freq: Once | ORAL | Status: AC
  Administered 2020-12-29: 1000 mg via ORAL

## 2020-12-29 MED ORDER — PROPOFOL 10 MG/ML IV BOLUS
INTRAVENOUS | Status: AC
  Filled 2020-12-29: qty 20

## 2020-12-29 MED ORDER — OXYCODONE HCL 5 MG PO TABS: 5.0000 mg | ORAL_TABLET | Freq: Once | ORAL | Status: DC | PRN

## 2020-12-29 MED ORDER — PHENYLEPHRINE HCL (PRESSORS) 10 MG/ML IV SOLN
INTRAVENOUS | Status: AC
  Filled 2020-12-29: qty 1

## 2020-12-29 MED ORDER — MIDAZOLAM HCL 5 MG/5ML IJ SOLN
INTRAMUSCULAR | Status: DC | PRN
  Administered 2020-12-29: 2 mg via INTRAVENOUS

## 2020-12-29 MED ORDER — IOHEXOL 300 MG/ML  SOLN
INTRAMUSCULAR | Status: DC | PRN
  Administered 2020-12-29: 7 mL

## 2020-12-29 MED ORDER — LIDOCAINE 2% (20 MG/ML) 5 ML SYRINGE
INTRAMUSCULAR | Status: DC | PRN
  Administered 2020-12-29: 100 mg via INTRAVENOUS

## 2020-12-29 MED ORDER — ONDANSETRON HCL 4 MG/2ML IJ SOLN
INTRAMUSCULAR | Status: DC | PRN
  Administered 2020-12-29: 4 mg via INTRAVENOUS

## 2020-12-29 MED ORDER — FLEET ENEMA 7-19 GM/118ML RE ENEM
1.0000 | ENEMA | Freq: Once | RECTAL | Status: AC
  Administered 2020-12-29: 1 via RECTAL

## 2020-12-29 MED ORDER — FENTANYL CITRATE (PF) 100 MCG/2ML IJ SOLN: 25.0000 ug | INTRAMUSCULAR | Status: DC | PRN

## 2020-12-29 MED ORDER — ONDANSETRON HCL 4 MG/2ML IJ SOLN
INTRAMUSCULAR | Status: AC
  Filled 2020-12-29: qty 2

## 2020-12-29 MED ORDER — ACETAMINOPHEN 500 MG PO TABS
ORAL_TABLET | ORAL | Status: AC
  Filled 2020-12-29: qty 2

## 2020-12-29 MED ORDER — CARVEDILOL 6.25 MG PO TABS
6.2500 mg | ORAL_TABLET | Freq: Two times a day (BID) | ORAL | Status: DC
  Administered 2020-12-29: 6.25 mg via ORAL
  Filled 2020-12-29: qty 1

## 2020-12-29 MED ORDER — PROPOFOL 10 MG/ML IV BOLUS
INTRAVENOUS | Status: DC | PRN
  Administered 2020-12-29: 140 mg via INTRAVENOUS

## 2020-12-29 SURGICAL SUPPLY — 32 items
BAG DRN RND TRDRP ANRFLXCHMBR (UROLOGICAL SUPPLIES) ×2
BAG URINE DRAIN 2000ML AR STRL (UROLOGICAL SUPPLIES) ×3 IMPLANT
BLADE CLIPPER SENSICLIP SURGIC (BLADE) ×3 IMPLANT
CATH FOLEY 2WAY SLVR  5CC 16FR (CATHETERS) ×1
CATH FOLEY 2WAY SLVR 5CC 16FR (CATHETERS) ×2 IMPLANT
CATH ROBINSON RED A/P 20FR (CATHETERS) ×3 IMPLANT
CLOTH BEACON ORANGE TIMEOUT ST (SAFETY) ×3 IMPLANT
CNTNR URN SCR LID CUP LEK RST (MISCELLANEOUS) ×4 IMPLANT
CONT SPEC 4OZ STRL OR WHT (MISCELLANEOUS) ×6
COVER BACK TABLE 60X90IN (DRAPES) ×3 IMPLANT
COVER MAYO STAND STRL (DRAPES) ×3 IMPLANT
DRSG TEGADERM 4X4.75 (GAUZE/BANDAGES/DRESSINGS) ×3 IMPLANT
DRSG TEGADERM 8X12 (GAUZE/BANDAGES/DRESSINGS) ×3 IMPLANT
GAUZE SPONGE 4X4 12PLY STRL (GAUZE/BANDAGES/DRESSINGS) ×3 IMPLANT
GLOVE SURG ENC MOIS LTX SZ6.5 (GLOVE) ×9 IMPLANT
GLOVE SURG ORTHO LTX SZ8.5 (GLOVE) ×9 IMPLANT
GLOVE SURG POLYISO LF SZ6.5 (GLOVE) ×3 IMPLANT
GLOVE SURG UNDER LTX SZ6.5 (GLOVE) ×3 IMPLANT
GLOVE SURG UNDER POLY LF SZ7 (GLOVE) ×6 IMPLANT
GOWN STRL REUS W/TWL LRG LVL3 (GOWN DISPOSABLE) ×9 IMPLANT
I-Seed AgX100 ×231 IMPLANT
IMPL SPACEOAR VUE SYSTEM (Spacer) ×2 IMPLANT
IMPLANT SPACEOAR VUE SYSTEM (Spacer) ×3 IMPLANT
IV NS 1000ML (IV SOLUTION) ×3
IV NS 1000ML BAXH (IV SOLUTION) ×2 IMPLANT
KIT TURNOVER CYSTO (KITS) ×3 IMPLANT
MARKER SKIN DUAL TIP RULER LAB (MISCELLANEOUS) ×3 IMPLANT
PACK CYSTO (CUSTOM PROCEDURE TRAY) ×3 IMPLANT
SYR 10ML LL (SYRINGE) ×6 IMPLANT
TOWEL OR 17X26 10 PK STRL BLUE (TOWEL DISPOSABLE) ×3 IMPLANT
UNDERPAD 30X36 HEAVY ABSORB (UNDERPADS AND DIAPERS) ×6 IMPLANT
WATER STERILE IRR 500ML POUR (IV SOLUTION) ×3 IMPLANT

## 2020-12-29 NOTE — Interval H&P Note (Signed)
History and Physical Interval Note:  12/29/2020 1:06 PM  Randy Moore  has presented today for surgery, with the diagnosis of PROSTATE CANCER.  The various methods of treatment have been discussed with the patient and family. After consideration of risks, benefits and other options for treatment, the patient has consented to  Procedure(s) with comments: RADIOACTIVE SEED IMPLANT/BRACHYTHERAPY IMPLANT (N/A) - 90 MINS SPACE OAR INSTILLATION (N/A) as a surgical intervention.  The patient's history has been reviewed, patient examined, no change in status, stable for surgery.  I have reviewed the patient's chart and labs.  Questions were answered to the patient's satisfaction.     Trista Ciocca D Manasvini Whatley

## 2020-12-29 NOTE — Anesthesia Procedure Notes (Signed)
Procedure Name: LMA Insertion Date/Time: 12/29/2020 1:20 PM Performed by: Eulas Post, Cataleyah Colborn W, CRNA Pre-anesthesia Checklist: Patient identified, Emergency Drugs available, Suction available and Patient being monitored Patient Re-evaluated:Patient Re-evaluated prior to induction Oxygen Delivery Method: Circle system utilized Preoxygenation: Pre-oxygenation with 100% oxygen Induction Type: IV induction Ventilation: Mask ventilation without difficulty LMA: LMA inserted LMA Size: 5.0 Number of attempts: 1 Placement Confirmation: positive ETCO2 and breath sounds checked- equal and bilateral Tube secured with: Tape Dental Injury: Teeth and Oropharynx as per pre-operative assessment

## 2020-12-29 NOTE — H&P (Signed)
CC/HPI: cc: prostate biopsy results   04/03/20: 55 year old man referred by his PCP for an elevated PSA 4.1. Free PSA was low at 0.6. Patient has no family history of prostate cancer and denies any recent UTIs, catheter trauma to the area. He denies history of gross hematuria. He does have urinary frequency and nocturia x2. He said it is worse with drinking fluids.   IPSS: 3, 2, 0, 1, 0, 0, 2=8/35  4/6 mostly dissatisfied   05/18/20: 55 year old man returns for follow-up after TRUS prostate biopsy done for an elevated PSA of 6.49. Pathology report shows Gleason 3 + 4 prostate cancer and 5/12 cores. Patient is doing fine since the biopsy. He is taking Flomax which is helping his urinary symptoms.   12/14/20: 55 year old man with pre biopsy PSA 6.49 and prostate cancer Gleason 3+4=7 in 5/12. He is scheduled for brachytherapy and Space OAR next month. Patient has completed tamsulosin and noted that it did help his urinary symptoms. He is no longer taking it however states his symptoms are still manageable.   Favorable intermediate risk prostate cancer  cT1c  Pre-treatment PSA 6.49     ALLERGIES: No Known Drug Allergies    MEDICATIONS: Aspirin 81 mg tablet,chewable  Atorvastatin Calcium  Carvedilol  Famotidine  Losartan Potassium  Nitroglycerin     GU PSH: Prostate Needle Biopsy - 05/01/2020     NON-GU PSH: Surgical Pathology, Gross And Microscopic Examination For Prostate Needle - 05/01/2020     GU PMH: BPH w/LUTS, Continue Flomax - 05/18/2020, Patient is interested in trying tamsulosin 0.4 mg daily for his urinary symptoms. I discussed possible side effects and had a best take medication. Will follow-up in 4-6 weeks to see how it is doing., - 04/03/2020 Prostate Cancer, Discussed patient's prostate biopsy results which showed Gleason 3 + 4 in 5/12 cores. We discussed Gleason scoring, grade group and risk stratification (Gleason 3 + 4, grade group 2, favorable intermediate risk). I gave  patient a copy of localized prostate cancer patient guide come his pathology report, and pre radical prostatectomy MSK nomogram. We discussed management options for his prostate cancer including radical prostatectomy and radiation. Patient does not appear to be interested in radical prostatectomy and much more interested in proceeding with radiation. He will be referred to radiation oncology to discuss treatment options. - 05/18/2020 Urinary Frequency - 05/18/2020, - 04/03/2020 Elevated PSA - 05/01/2020, - 01/24/2020 Nocturia - 04/03/2020    NON-GU PMH: Hypercholesterolemia Hypertension Ischemic cardiomyopathy Myocardial Infarction Other heart failure Personal history of other diseases of the circulatory system    FAMILY HISTORY: Heart Attack - Father   SOCIAL HISTORY: Marital Status: Married Preferred Language: English; Ethnicity: Not Hispanic Or Latino; Race: Black or African American Current Smoking Status: Patient has never smoked.   Tobacco Use Assessment Completed: Used Tobacco in last 30 days? Does not drink anymore.     REVIEW OF SYSTEMS:    GU Review Male:   Patient denies frequent urination, hard to postpone urination, burning/ pain with urination, get up at night to urinate, leakage of urine, stream starts and stops, trouble starting your stream, have to strain to urinate , erection problems, and penile pain.  Gastrointestinal (Upper):   Patient denies nausea, vomiting, and indigestion/ heartburn.  Gastrointestinal (Lower):   Patient denies diarrhea and constipation.  Constitutional:   Patient denies fever, night sweats, weight loss, and fatigue.  Skin:   Patient denies skin rash/ lesion and itching.  Eyes:   Patient denies blurred vision and  double vision.  Ears/ Nose/ Throat:   Patient denies sore throat and sinus problems.  Hematologic/Lymphatic:   Patient denies swollen glands and easy bruising.  Cardiovascular:   Patient denies leg swelling and chest pains.  Respiratory:    Patient denies cough and shortness of breath.  Endocrine:   Patient denies excessive thirst.  Musculoskeletal:   Patient denies back pain and joint pain.  Neurological:   Patient denies headaches and dizziness.  Psychologic:   Patient denies anxiety and depression.   VITAL SIGNS:      12/14/2020 09:42 AM  Weight 170 lb / 77.11 kg  Height 68 in / 172.72 cm  BP 151/95 mmHg  Pulse 63 /min  Temperature 97.5 F / 36.3 C  BMI 25.8 kg/m   MULTI-SYSTEM PHYSICAL EXAMINATION:    Constitutional: Well-nourished. No physical deformities. Normally developed. Good grooming.  Neck: Neck symmetrical, not swollen. Normal tracheal position.  Respiratory: No labored breathing, no use of accessory muscles.   Skin: No paleness, no jaundice, no cyanosis. No lesion, no ulcer, no rash.  Neurologic / Psychiatric: Oriented to time, oriented to place, oriented to person. No depression, no anxiety, no agitation.  Gastrointestinal: No rigidity, non obese abdomen.   Eyes: Normal conjunctivae. Normal eyelids.  Ears, Nose, Mouth, and Throat: Left ear no scars, no lesions, no masses. Right ear no scars, no lesions, no masses. Nose no scars, no lesions, no masses. Normal hearing. Normal lips.  Musculoskeletal: Normal gait and station of head and neck.     Complexity of Data:  Records Review:   Previous Patient Records, POC Tool  Urine Test Review:   Urinalysis   04/03/20  PSA  Total PSA 6.49 ng/mL   Notes:                     06/08/2020: BUN 8, creatinine 1.01   PROCEDURES:          Urinalysis Dipstick Dipstick Cont'd  Color: Amber Bilirubin: Neg mg/dL  Appearance: Clear Ketones: Neg mg/dL  Specific Gravity: 1.025 Blood: Neg ery/uL  pH: 5.5 Protein: Trace mg/dL  Glucose: Neg mg/dL Urobilinogen: 0.2 mg/dL    Nitrites: Neg    Leukocyte Esterase: Neg leu/uL    ASSESSMENT:      ICD-10 Details  1 GU:   Prostate Cancer - C61 Chronic, Stable  2   BPH w/LUTS - N40.1 Chronic, Stable  3   Urinary Frequency -  R35.0 Chronic, Stable   PLAN:           Orders Labs PSA          Document Letter(s):  Created for Patient: Clinical Summary         Notes:   Will repeat PSA today as it has been approximately 8 months since last PSA. This will help [place] have a more accurate pretreatment PSA. He is scheduled for brachytherapy was space oar on 12/29/2020. All questions were answered about the procedure. In addition he would like to hold off on taking tamsulosin for now. If his symptoms worsened after the procedure I am happy to send a prescription over.

## 2020-12-29 NOTE — Anesthesia Preprocedure Evaluation (Addendum)
Anesthesia Evaluation  Patient identified by MRN, date of birth, ID band Patient awake    Reviewed: Allergy & Precautions, NPO status , Patient's Chart, lab work & pertinent test results, reviewed documented beta blocker date and time   History of Anesthesia Complications Negative for: history of anesthetic complications  Airway Mallampati: II  TM Distance: >3 FB Neck ROM: Full    Dental  (+) Missing,    Pulmonary neg pulmonary ROS,    Pulmonary exam normal        Cardiovascular hypertension, Pt. on medications and Pt. on home beta blockers + CAD, + Past MI (STEMI 2018) and + Cardiac Stents  Normal cardiovascular exam  TTE 05/2020: EF 40-45%, RWMAs, grade 1 DD, valves ok   Neuro/Psych negative neurological ROS  negative psych ROS   GI/Hepatic negative GI ROS, Neg liver ROS,   Endo/Other  negative endocrine ROS  Renal/GU negative Renal ROS  negative genitourinary   Musculoskeletal negative musculoskeletal ROS (+)   Abdominal   Peds  Hematology negative hematology ROS (+)   Anesthesia Other Findings Day of surgery medications reviewed with patient.  Reproductive/Obstetrics negative OB ROS                           Anesthesia Physical Anesthesia Plan  ASA: III  Anesthesia Plan: General   Post-op Pain Management:    Induction: Intravenous  PONV Risk Score and Plan: 2 and Treatment may vary due to age or medical condition, Ondansetron, Dexamethasone and Midazolam  Airway Management Planned: LMA  Additional Equipment: None  Intra-op Plan:   Post-operative Plan: Extubation in OR  Informed Consent: I have reviewed the patients History and Physical, chart, labs and discussed the procedure including the risks, benefits and alternatives for the proposed anesthesia with the patient or authorized representative who has indicated his/her understanding and acceptance.     Dental advisory  given  Plan Discussed with: CRNA  Anesthesia Plan Comments:        Anesthesia Quick Evaluation

## 2020-12-29 NOTE — Discharge Instructions (Signed)
Post Anesthesia Home Care Instructions  Activity: Get plenty of rest for the remainder of the day. A responsible adult should stay with you for 24 hours following the procedure.  For the next 24 hours, DO NOT: -Drive a car -Paediatric nurse -Drink alcoholic beverages -Take any medication unless instructed by your physician -Make any legal decisions or sign important papers.  Meals: Start with liquid foods such as gelatin or soup. Progress to regular foods as tolerated. Avoid greasy, spicy, heavy foods. If nausea and/or vomiting occur, drink only clear liquids until the nausea and/or vomiting subsides. Call your physician if vomiting continues.  Special Instructions/Symptoms: Your throat may feel dry or sore from the anesthesia or the breathing tube placed in your throat during surgery. If this causes discomfort, gargle with warm salt water. The discomfort should disappear within 24 hours.        Antibiotics You may be given a prescription for an antibiotic to take when you arrive home. If so, be sure to take every tablet in the bottle, even if you are feeling better before the prescription is finished. If you begin itching, notice a rash or start to swell on your trunk, arms, legs and/or throat, immediately stop taking the antibiotic and call your Urologist. Diet Resume your usual diet when you return home. To keep your bowels moving easily and softly, drink prune, apple and cranberry juice at room temperature. You may also take a stool softener, such as Colace, which is available without prescription at local pharmacies. Daily activities ? No driving or heavy lifting for at least two days after the implant. ? No bike riding, horseback riding or riding lawn mowers for the first month after the implant. ? Any strenuous physical activity should be approved by your doctor before you resume it. Sexual relations You may resume sexual relations two weeks after the procedure. A condom  should be used for the first two weeks. Your semen may be dark brown or black; this is normal and is related bleeding that may have occurred during the implant. Postoperative swelling Expect swelling and bruising of the scrotum and perineum (the area between the scrotum and anus). Both the swelling and the bruising should resolve in l or 2 weeks. Ice packs and over- the-counter medications such as Tylenol, Advil or Aleve may lessen your discomfort. Postoperative urination Most men experience burning on urination and/or urinary frequency. If this becomes bothersome, contact your Urologist.  Medication can be prescribed to relieve these problems.  It is normal to have some blood in your urine for a few days after the implant. Special instructions related to the seeds It is unlikely that you will pass an Iodine-125 seed in your urine. The seeds are silver in color and are about as large as a grain of rice. If you pass a seed, do not handle it with your fingers. Use a spoon to place it in an envelope or jar in place this in base occluded area such as the garage or basement for return to the radiation clinic at your convenience.  Contact your doctor for ? Temperature greater than 101 F ? Increasing pain ? Inability to urinate Follow-up  You should have follow up with your urologist and radiation oncologist about 3 weeks after the procedure. General information regarding Iodine seeds ? Iodine-125 is a low energy radioactive material. It is not deeply penetrating and loses energy at short distances. Your prostate will absorb the radiation. Objects that are touched or used by the  patient do not become radioactive. ? Body wastes (urine and stool) or body fluids (saliva, tears, semen or blood) are not radioactive. ? The Nuclear Regulatory Commission [facility]) has determined that no radiation precautions are needed for patients undergoing Iodine-125 seed implantation. The [facility] states that such patients do not  present a risk to the people around them, including young children and pregnant women. However, in keeping with the general principle that radiation exposure should be kept as low reasonably possible, we suggest the following: ? Children and pets should not sit on the patient's lap for the first two (2) weeks after the implant. ? Pregnant (or possibly pregnant) women should avoid prolonged, close contact with the patient for the first two (2) weeks after the implant. ? A distance of three (3) feet is acceptable. ? At a distance of three (3) feet, there is no limit to the length of time anyone can be with the patient.

## 2020-12-29 NOTE — Transfer of Care (Signed)
Immediate Anesthesia Transfer of Care Note  Patient: Randy Moore  Procedure(s) Performed: RADIOACTIVE SEED IMPLANT/BRACHYTHERAPY IMPLANT (N/A Prostate) SPACE OAR INSTILLATION (N/A )  Patient Location: PACU  Anesthesia Type:General  Level of Consciousness: drowsy  Airway & Oxygen Therapy: Patient Spontanous Breathing and Patient connected to face mask oxygen  Post-op Assessment: Report given to RN and Post -op Vital signs reviewed and stable  Post vital signs: Reviewed and stable  Last Vitals:  Vitals Value Taken Time  BP 133/87 12/29/20 1451  Temp    Pulse 69 12/29/20 1452  Resp 17 12/29/20 1452  SpO2 99 % 12/29/20 1452  Vitals shown include unvalidated device data.  Last Pain:  Vitals:   12/29/20 1137  TempSrc:   PainSc: 0-No pain      Patients Stated Pain Goal: 5 (16/96/78 [id number])  Complications: No complications documented.

## 2020-12-29 NOTE — Op Note (Signed)
PATIENT:  Randy Moore  PRE-OPERATIVE DIAGNOSIS:  Adenocarcinoma of the prostate  POST-OPERATIVE DIAGNOSIS:  Same  PROCEDURE:  1. I-125 radioactive seed implantation 2. Cystoscopy  3. Placement of SpaceOAR  SURGEON:  Jacalyn Lefevre, MD  Radiation oncologist: Dr. Tyler Pita  ANESTHESIA:  General  EBL:  Minimal  DRAINS: none  INDICATION: Randy Moore  55 year old man with pre biopsy PSA 6.49 and prostate cancer Gleason 3+4=7 in 5/12. He is scheduled for brachytherapy and Space OAR.  Description of procedure: After informed consent the patient was brought to the major OR, placed on the table and administered general anesthesia. He was then moved to the modified lithotomy position with his perineum perpendicular to the floor. His perineum and genitalia were then sterilely prepped. An official timeout was then performed. A 16 French Foley catheter was then placed in the bladder and filled with dilute contrast, a rectal tube was placed in the rectum and the transrectal ultrasound probe was placed in the rectum and affixed to the stand. He was then sterilely draped.  Real time ultrasonography was used along with the seed planning software On[employer] Prostate. This was used to develop the seed plan including the number of needles as well as number of seeds required for complete and adequate coverage. Real-time ultrasonography was then used along with the previously developed plan and the Nucletron device to implant a total of 77 seeds using 20 needles. This proceeded without difficulty or complication.   I then proceeded with placement of SpaceOARby introducing a needle with the bevel angled inferiorly approximately 2 cm superior to the anus. This was angled downward and under direct ultrasound was placed within the space between the prostatic capsule and rectum. This was confirmed with a small amount of sterile saline injected and this was performed under direct ultrasound. I then attached  the SpaceOARto the needle and injected this in the space between the prostate and rectum with good placement noted.  A Foley catheter was then removed as well as the transrectal ultrasound probe and rectal probe. Flexible cystoscopy was then performed using the 17 French flexible scope which revealed a normal urethra throughout its length down to the sphincter which appeared intact. The prostatic urethra revealed bilobar hypertrophy but no evidence of obstruction, seeds, spacers or lesions. The bladder was then entered and fully and systematically inspected. The ureteral orifices were noted to be of normal configuration and position. The mucosa revealed no evidence of tumors. There were also no stones identified within the bladder. I noted no seeds or spacers on the floor of the bladder and retroflexion of the scope revealed no seeds protruding from the base of the prostate.  The cystoscope was then removed and the patient was awakened and taken to recovery room in stable and satisfactory condition. He tolerated procedure well and there were no intraoperative complications.

## 2020-12-29 NOTE — Anesthesia Postprocedure Evaluation (Signed)
Anesthesia Post Note  Patient: Randy Moore  Procedure(s) Performed: RADIOACTIVE SEED IMPLANT/BRACHYTHERAPY IMPLANT (N/A Prostate) SPACE OAR INSTILLATION (N/A )     Patient location during evaluation: PACU Anesthesia Type: General Level of consciousness: sedated and patient cooperative Pain management: pain level controlled Vital Signs Assessment: post-procedure vital signs reviewed and stable Respiratory status: spontaneous breathing Cardiovascular status: stable Anesthetic complications: no   No complications documented.  Last Vitals:  Vitals:   12/29/20 1600 12/29/20 1608  BP: 121/85   Pulse: (!) 59 65  Resp: 11 15  Temp:  36.6 C  SpO2: 100% 100%    Last Pain:  Vitals:   12/29/20 1600  TempSrc:   PainSc: 0-No pain                 Nolon Nations

## 2020-12-30 ENCOUNTER — Encounter (HOSPITAL_BASED_OUTPATIENT_CLINIC_OR_DEPARTMENT_OTHER): Payer: Self-pay | Admitting: Urology

## 2020-12-30 NOTE — Addendum Note (Signed)
Addendum  created 12/30/20 0810 by Rogers Blocker, CRNA   Charge Capture section accepted

## 2021-01-21 ENCOUNTER — Telehealth: Payer: Self-pay | Admitting: *Deleted

## 2021-01-21 NOTE — Telephone Encounter (Signed)
CALLED PATIENT TO REMIND OF HIS POST SEED APPTS. FOR 01-22-21, SPOKE WITH PATIENT AND HE IS AWARE OF THESE APPTS.

## 2021-01-21 NOTE — Progress Notes (Signed)
Radiation Oncology         [phone] ________________________________  Name: Randy Moore MRN: [id number]  Date: 01/22/2021  DOB: 14-Sep-1965  Post-Seed Follow-Up Visit Note  CC: Long, [name], PA-C  Robley Fries, MD  Diagnosis:   55 y.o. gentleman with Stage T1c adenocarcinoma of the prostate with Gleason score of 3+4, and PSA of 6.49.    ICD-10-CM   1. Malignant neoplasm of prostate ([place])  C61     Interval Since Last Radiation:  3.5 weeks 12/29/20:  Insertion of radioactive I-125 seeds into the prostate gland; 145 Gy, definitive therapy with placement of SpaceOAR Vue gel.  Narrative:  The patient returns today for routine follow-up.  He is complaining of increased urinary frequency and urinary hesitation symptoms. He filled out a questionnaire regarding urinary function today providing and overall IPSS score of 14 characterizing his symptoms as moderate with mild increased frequency, urgency, intermittency and nocturia x2.  He specifically denies dysuria, gross hematuria, straining to void, incomplete bladder emptying or incontinence.  He is no longer taking Flomax and feels that his LUTS are pretty much back to baseline at this point.  His pre-implant score was 13. He denies any abdominal pain or bowel symptoms.  He reports a healthy appetite and is maintaining his weight.  Overall, he is quite pleased with his progress to date.  ALLERGIES:  has No Known Allergies.  Meds: Current Outpatient Medications  Medication Sig Dispense Refill  . aspirin EC 81 MG EC tablet Take 1 tablet (81 mg total) by mouth daily.    Marland Kitchen atorvastatin (LIPITOR) 80 MG tablet Take 1 tablet (80 mg total) by mouth every evening. 90 tablet 1  . carvedilol (COREG) 6.25 MG tablet Take 1 tablet (6.25 mg total) by mouth in the morning and at bedtime. 180 tablet 3  . famotidine (PEPCID) 20 MG tablet TAKE 1 TABLET(20 MG) BY MOUTH TWICE DAILY (Patient not taking: Reported on 12/23/2020) 60 tablet 5  . meloxicam (MOBIC)  7.5 MG tablet TAKE 1 TABLET(7.5 MG) BY MOUTH DAILY 30 tablet 0  . nitroGLYCERIN (NITROSTAT) 0.4 MG SL tablet PLACE 1 TABLET UNDER TONGUE EVERY 5 MINUTES FOR 3 DOSES AS NEEDED FOR CHEST PAIN (Patient taking differently: Place under the tongue every 5 (five) minutes as needed.) 25 tablet 4  . sacubitril-valsartan (ENTRESTO) 97-103 MG Take 1 tablet by mouth 2 (two) times daily. 180 tablet 3  . tamsulosin (FLOMAX) 0.4 MG CAPS capsule Take 0.4 mg by mouth at bedtime.    . traMADol (ULTRAM) 50 MG tablet Take 1 tablet (50 mg total) by mouth every 6 (six) hours as needed. 10 tablet 0   No current facility-administered medications for this visit.    Physical Findings: In general this is a well appearing African American male in no acute distress. He's alert and oriented x4 and appropriate throughout the examination. Cardiopulmonary assessment is negative for acute distress and he exhibits normal effort.   Lab Findings: Lab Results  Component Value Date   WBC 5.0 12/28/2020   HGB 15.4 12/28/2020   HCT 45.8 12/28/2020   MCV 84.3 12/28/2020   PLT 178 12/28/2020    Radiographic Findings:  Patient underwent CT imaging in our clinic for post implant dosimetry. The CT will be reviewed by Dr. Tammi Klippel to confirm there is an adequate distribution of radioactive seeds throughout the prostate gland and ensure that there are no seeds in or near the rectum.  We suspect the final radiation plan and dosimetry  will show appropriate coverage of the prostate gland. He understands that we will call and inform him of any unexpected findings on further review of his imaging and dosimetry.  Impression/Plan: 55 y.o. gentleman with Stage T1c adenocarcinoma of the prostate with Gleason score of 3+4, and PSA of 6.49. The patient is recovering from the effects of radiation. His urinary symptoms should gradually improve over the next 4-6 months. We talked about this today. He is encouraged by his improvement already and is  otherwise pleased with his outcome. We also talked about long-term follow-up for prostate cancer following seed implant. He understands that ongoing PSA determinations and digital rectal exams will help perform surveillance to rule out disease recurrence. He saw Jiles Crocker on 01/11/2021 and will have a follow up appointment scheduled with Dr. Claudia Desanctis in August 2022 with a posttreatment PSA prior to that visit. He understands what to expect with his PSA measures. Patient was also educated today about some of the long-term effects from radiation including a small risk for rectal bleeding and possibly erectile dysfunction. We talked about some of the general management approaches to these potential complications. However, I did encourage the patient to contact our office or return at any point if he has questions or concerns related to his previous radiation and prostate cancer.    Nicholos Johns, PA-C

## 2021-01-21 NOTE — Progress Notes (Signed)
  Radiation Oncology         [phone]) [phone] ________________________________  Name: Randy Moore MRN: [id number]  Date: 01/22/2021  DOB: 1965-10-09  COMPLEX SIMULATION NOTE  NARRATIVE:  The patient was brought to the [facility] today following prostate seed implantation approximately one month ago.  Identity was confirmed.  All relevant records and images related to the planned course of therapy were reviewed.  Then, the patient was set-up supine.  CT images were obtained.  The CT images were loaded into the planning software.  Then the prostate and rectum were contoured.  Treatment planning then occurred.  The implanted iodine 125 seeds were identified by the physics staff for projection of radiation distribution  I have requested : 3D Simulation  I have requested a DVH of the following structures: Prostate and rectum.    ________________________________  Sheral Apley Tammi Klippel, M.D.

## 2021-01-22 ENCOUNTER — Other Ambulatory Visit: Payer: Self-pay

## 2021-01-22 ENCOUNTER — Ambulatory Visit
Admission: RE | Admit: 2021-01-22 | Discharge: 2021-01-22 | Disposition: A | Discharge status: Home / Self Care | Payer: 59 | Source: Ambulatory Visit | Attending: Urology | Admitting: Urology

## 2021-01-22 ENCOUNTER — Encounter: Payer: Self-pay | Admitting: Medical Oncology

## 2021-01-22 ENCOUNTER — Ambulatory Visit
Admission: RE | Admit: 2021-01-22 | Discharge: 2021-01-22 | Disposition: A | Discharge status: Home / Self Care | Payer: 59 | Source: Ambulatory Visit | Attending: Radiation Oncology | Admitting: Radiation Oncology

## 2021-01-22 ENCOUNTER — Encounter: Payer: Self-pay | Admitting: Urology

## 2021-01-22 DIAGNOSIS — C61 Malignant neoplasm of prostate: Secondary | ICD-10-CM | POA: Insufficient documentation

## 2021-01-22 NOTE — Progress Notes (Signed)
Patient in for post-seed appointment. AUA is 14 he is on flomax one time per day. Denies any pain. CT complete. No appointment with urology that he is aware of. No MRI needed.

## 2021-02-04 NOTE — Progress Notes (Signed)
Cardiology Office Note:    Date:  02/05/2021   ID:  Randy Moore, DOB Sep 19, 1965, MRN [id number]  PCP:  Randy Rosser, PA-C   [facility] Providers Cardiologist:  Sherren Mocha, MD Cardiology APP:  Sharmon Revere     Referring MD: Randy Rosser, PA-C   Chief Complaint:  Follow-up (CAD, CHF)    Patient Profile:    Randy Moore is a 55 y.o. male with:  Coronary artery disease S/p inferior MI (RV infarct) 08/2016 c/b hypotension/bradycardia >> PCI:  DES to RCA Staged PCI 08/2016:  DES to mid LAD and DES to RI; POBA to D2 unsuccessful Myoview 01/2019: EF 26, large anterior scar, no ischemia Ischemic CM HFmrEF (heart failure with mildly reduced ejection fraction)  Echocardiogram 2/19: EF 35-40 Echo 01/2019: EF 30-35, GR 1 DD Echocardiogram 10/21: EF 40-45, Gr 1 DD Hyperlipidemia  Hypertension  Medication nonadherence Prostate CA Radiation Rx started 5/22   Prior CV studies: Echocardiogram 05/29/20 EF 40-45, Gr 1 DD, normal RVSF   Echocardiogram 02/18/2019 EF 30-35, Gr 1 DD, ant-sept and apical AK   Myoview 02/01/2019 EF 26, no ischemia, Lg scar (ant, ant-sept, inf, inf-sept, apical); High Risk   Echo 09/26/17 EF 35-40, ant-sept, ant, apical AK, Gr 1 DD   Echo 12/23/16 Mild conc LVH, EF 35-40, Gr 1 DD, inf-sept, ant-sept, ant, apical sept and ant AK   Echo 09/23/16 EF 35-40, inf-septal HK, apical septal and apical AK, ant and inf AK, ant-septal AK, Gr 1 diastolic dysfunction   PCI 09/22/16 3 x 38 mm Promus DES to mLAD Attempted POBA to oD2 (80 >> 80) 3 x 28 mm Promus DES to RI   LHC 09/21/16 LAD prox 99, ostial D1 80 RI prox 70 LCx ost 30, mid 70 RCA 100 EF 45-50 PCI: 2.5 x 20 mm Promus DES to pRCA   History of Present Illness: Randy Moore was last seen in 3/22.  His BP was high but he was off of his Entresto and Carvedilol.  I resumed these.  He returns for f/u.  Since last seen, he was diagnosed with prostate cancer.  He started radiation treatment.  Over the  past couple weeks, he has had fairly constant right-sided chest discomfort.  He also notes that in his right scapular area.  He continues to exercise without symptoms.  He has not had any symptoms reminiscent of his previous angina.  He has not had pleuritic symptoms or symptoms with lying supine.  He has not had any radiating symptoms.  He has not had fever, cough, hemoptysis.  He has not had syncope, orthopnea, leg edema.  He does feel somewhat tender to the touch over his right chest.  His PCP sent him in for a thoracic spine CT that was unremarkable.       Past Medical History:  Diagnosis Date   Acute right ventricular myocardial infarction ([place]) 09/21/2016   Cardiogenic shock ([place]) 09/21/2016   Chronic combined systolic and diastolic heart failure ([place]) 09/21/2016   Coronary artery disease involving native coronary artery of native heart without angina pectoris 09/21/2016   a - Inf and RV infarct (STEMI) 1/18: LHC - pLAD, oD1 80, pRI 70, oLCx 30, mLCx 70, RCA 100, EF 45-50 >> PCI:  2.5 x 20 mm Promus DES to pRCA // Staged PCI 09/22/16: 3 x 38 mm Promus DES to mLAD; Attempted POBA to oD2 (80 >> 80); 3 x 28 mm Promus DES to RI   Enlarged prostate  Hyperlipidemia 09/29/2016   Hypertension    Ischemic cardiomyopathy 09/23/2016   a - s/p Inf and RV infarct (STEMI) 1/18 // b - Echo 09/23/16: EF 35-40, inf-septal HK, apical septal and apical AK, ant and inf AK, ant-septal AK, Gr 1 diastolic dysfunction // c -Echo 5/18: mild conc LVH, EF 35-40, Gr 1 DD, inf-septal, ant-septal, ant, apical AK   Noncompliance with medication regimen    Presence of drug coated stent in right coronary artery 09/21/2016   Prostate cancer ([place])    S/P angioplasty with stent, LAD and Ramus with DES 09/22/16 09/23/2016   STEMI involving oth coronary artery of inferior wall ([place]) 09/21/2016    Current Medications: Current Meds  Medication Sig   aspirin EC 81 MG EC tablet Take 1 tablet (81 mg total) by mouth daily.   atorvastatin  (LIPITOR) 80 MG tablet Take 1 tablet (80 mg total) by mouth every evening.   carvedilol (COREG) 6.25 MG tablet Take 1 tablet (6.25 mg total) by mouth in the morning and at bedtime.   famotidine (PEPCID) 20 MG tablet TAKE 1 TABLET(20 MG) BY MOUTH TWICE DAILY   meloxicam (MOBIC) 7.5 MG tablet TAKE 1 TABLET(7.5 MG) BY MOUTH DAILY   nitroGLYCERIN (NITROSTAT) 0.4 MG SL tablet PLACE 1 TABLET UNDER TONGUE EVERY 5 MINUTES FOR 3 DOSES AS NEEDED FOR CHEST PAIN   sacubitril-valsartan (ENTRESTO) 97-103 MG Take 1 tablet by mouth 2 (two) times daily.   tamsulosin (FLOMAX) 0.4 MG CAPS capsule Take 0.4 mg by mouth at bedtime.   traMADol (ULTRAM) 50 MG tablet Take 1 tablet (50 mg total) by mouth every 6 (six) hours as needed.     Allergies:   Patient has no known allergies.   Social History   Tobacco Use   Smoking status: Never   Smokeless tobacco: Never  Vaping Use   Vaping Use: Never used  Substance Use Topics   Alcohol use: No   Drug use: No     Family Hx: The patient's family history includes Heart attack in his father; Other in his mother. There is no history of Colon cancer, Liver cancer, Stomach cancer, Rectal cancer, or Esophageal cancer.  Review of Systems  Constitutional: Negative for fever, night sweats and weight loss.  Respiratory:  Negative for cough and hemoptysis.   Gastrointestinal:  Negative for melena.  Genitourinary:  Negative for hematuria.    EKGs/Labs/Other Test Reviewed:    EKG:  EKG is  ordered today.  The ekg ordered today demonstrates HR 66, normal axis, septal Q waves, J-point elevation V1-V2, T wave inversions V2-V4, QTC 423, similar to prior tracings  Recent Labs: 12/28/2020: ALT 23; BUN 13; Creatinine, Ser 0.98; Hemoglobin 15.4; Platelets 178; Potassium 4.3; Sodium 141   Recent Lipid Panel Lab Results  Component Value Date/Time   CHOL 130 04/10/2020 09:25 AM   TRIG 52 04/10/2020 09:25 AM   HDL 54 04/10/2020 09:25 AM   LDLCALC 64 04/10/2020 09:25 AM       Risk Assessment/Calculations:      Physical Exam:    VS:  BP 110/70 (BP Location: Left Arm, Patient Position: Sitting, Moore Size: Normal)   Pulse 78   Ht 5\' 8"  (1.727 m)   Wt 182 lb (82.6 kg)   SpO2 97%   BMI 27.67 kg/m     Wt Readings from Last 3 Encounters:  02/05/21 182 lb (82.6 kg)  12/29/20 179 lb 3.2 oz (81.3 kg)  12/28/20 160 lb (72.6 kg)  Constitutional:      Appearance: Healthy appearance. Not in distress.  Neck:     Vascular: No JVR. JVD normal.  Pulmonary:     Effort: Pulmonary effort is normal.     Breath sounds: No wheezing. No rales.  Chest:     Chest wall: Tender to palpatation (over R pectoralis).  Cardiovascular:     Normal rate. Regular rhythm. Normal S1. Normal S2.      Murmurs: There is no murmur.     No rub.  Edema:    Peripheral edema absent.  Abdominal:     Palpations: Abdomen is soft.  Skin:    General: Skin is warm and dry.  Neurological:     General: No focal deficit present.     Mental Status: Alert and oriented to person, place and time.     Cranial Nerves: Cranial nerves are intact.         ASSESSMENT & PLAN:    1. Other chest pain His chest symptoms sound noncardiac.  He is very active and continues to exercise without exertional symptoms.  He has not had shortness of breath or decreased exercise tolerance.  He had a recent thoracic spine CT that was unremarkable.  However, he does have a history of prostate cancer.  Given his history, I have recommended that we proceed with a chest CT with and without contrast to rule out metastatic disease, chest pathology, pulmonary embolism, etc.  As his symptoms do sound somewhat musculoskeletal in origin, I have asked him to take ibuprofen for 2 to 3 days and use warm and cold compresses.  If his symptoms continue and his CT is unremarkable, I have asked him to follow-up with primary care.  2. Coronary artery disease involving native coronary artery of native heart without angina  pectoris History of inferior MI in 1/18 treated with a DES to the RCA and staged PCI with DES to LAD and DES to the RI.  Attempted angioplasty to D2 was unsuccessful.  Myoview in 6/20 without ischemia.  As noted, his chest symptoms do not sound cardiac.  His EKG is unchanged.  Proceed with CT as outlined above.  I have asked him to contact me if his symptoms should become more reminiscent of his previous angina or become exertional.  Continue current dose of aspirin, atorvastatin, carvedilol.  Follow-up in 3 months.  3. HFmrEF (heart failure with mildly reduced EF) EF 40-45.  Ischemic cardiomyopathy.  NYHA I-II.  Volume status stable.  Continue current dose of Entresto, carvedilol.  4. Essential hypertension The patient's blood pressure is controlled on his current regimen.  Continue current therapy.       Dispo:  Return in about 3 months (around 05/08/2021) for Routine Follow Up, w/ Dr. Burt Knack, or [name], PA-C.   Medication Adjustments/Labs and Tests Ordered: Current medicines are reviewed at length with the patient today.  Concerns regarding medicines are outlined above.  Tests Ordered: Orders Placed This Encounter  Procedures   CT Angio Chest Pulmonary Embolism (PE) W or WO Contrast   Basic Metabolic Panel (BMET)   EKG 12-Lead   Medication Changes: No orders of the defined types were placed in this encounter.   Signed, [name], PA-C  02/05/2021 12:16 PM    [facility] [address], [place], [place]  [address] Phone: [phone]; Fax: [phone]

## 2021-02-05 ENCOUNTER — Ambulatory Visit (INDEPENDENT_AMBULATORY_CARE_PROVIDER_SITE_OTHER)
Admission: RE | Admit: 2021-02-05 | Discharge: 2021-02-05 | Disposition: A | Discharge status: Home / Self Care | Payer: 59 | Source: Ambulatory Visit | Attending: Physician Assistant | Admitting: Physician Assistant

## 2021-02-05 ENCOUNTER — Encounter: Payer: Self-pay | Admitting: Physician Assistant

## 2021-02-05 ENCOUNTER — Ambulatory Visit: Payer: 59 | Admitting: Physician Assistant

## 2021-02-05 ENCOUNTER — Other Ambulatory Visit: Payer: Self-pay

## 2021-02-05 VITALS — BP 110/70 | HR 78 | Ht 68.0 in | Wt 182.0 lb

## 2021-02-05 DIAGNOSIS — R0789 Other chest pain: Secondary | ICD-10-CM | POA: Diagnosis not present

## 2021-02-05 DIAGNOSIS — I2510 Atherosclerotic heart disease of native coronary artery without angina pectoris: Secondary | ICD-10-CM | POA: Diagnosis not present

## 2021-02-05 DIAGNOSIS — I5020 Unspecified systolic (congestive) heart failure: Secondary | ICD-10-CM | POA: Diagnosis not present

## 2021-02-05 DIAGNOSIS — I10 Essential (primary) hypertension: Secondary | ICD-10-CM

## 2021-02-05 LAB — BASIC METABOLIC PANEL
BUN/Creatinine Ratio: 11 (ref 9–20)
BUN: 11 mg/dL (ref 6–24)
CO2: 24 mmol/L (ref 20–29)
Calcium: 9.5 mg/dL (ref 8.7–10.2)
Chloride: 105 mmol/L (ref 96–106)
Creatinine, Ser: 1 mg/dL (ref 0.76–1.27)
Glucose: 101 mg/dL — ABNORMAL HIGH (ref 65–99)
Potassium: 3.8 mmol/L (ref 3.5–5.2)
Sodium: 143 mmol/L (ref 134–144)
eGFR: 88 mL/min/{1.73_m2} (ref >59)

## 2021-02-05 MED ORDER — IOHEXOL 350 MG/ML SOLN
80.0000 mL | Freq: Once | INTRAVENOUS | Status: AC | PRN
  Administered 2021-02-05: 80 mL via INTRAVENOUS

## 2021-02-05 NOTE — Patient Instructions (Signed)
Medication Instructions:   Ok to take ibuprofen (400-600 mg) twice or three times daily X 2-3 days.  Do Not take more than 3 days and take with food.    *If you need a refill on your cardiac medications before your next appointment, please call your pharmacy*   Lab Work:  TODAY!!!!  BMET  If you have labs (blood work) drawn today and your tests are completely normal, you will receive your results only by: [place] (if you have MyChart) OR A paper copy in the mail If you have any lab test that is abnormal or we need to change your treatment, we will call you to review the results.   Testing/Procedures: Non-Cardiac CT Angiography (CTA), is a special type of CT scan that uses a computer to produce multi-dimensional views of major blood vessels throughout the body. In CT angiography, a contrast material is injected through an IV to help visualize the blood vessels    Follow-Up: At [facility], you and your health needs are our priority.  As part of our continuing mission to provide you with exceptional heart care, we have created designated Provider Care Teams.  These Care Teams include your primary Cardiologist (physician) and Advanced Practice Providers (APPs -  Physician Assistants and Nurse Practitioners) who all work together to provide you with the care you need, when you need it.  We recommend signing up for the patient portal called "MyChart".  Sign up information is provided on this After Visit Summary.  MyChart is used to connect with patients for Virtual Visits (Telemedicine).  Patients are able to view lab/test results, encounter notes, upcoming appointments, etc.  Non-urgent messages can be sent to your provider as well.   To learn more about what you can do with MyChart, go to [email or web address].    Your next appointment:   3 month(s)  The format for your next appointment:   In Person  Provider:   [name], PA-C   Other Instructions Use warm with  cold compresses on right chest and right back.

## 2021-02-17 ENCOUNTER — Telehealth: Payer: Self-pay

## 2021-02-17 NOTE — Telephone Encounter (Signed)
Patient called complaining of occasional hematuria for about the past month. Stated it only occurs when he has urinary hesitancy/delay in voiding. States it doesn't appear to be a gross amount of blood, but it does concern him. Patient denied contacting his urologist with concern. Patient stated he did not have [facility]'s number and asked that I call on his behalf  to see if he could get an appointment this week  Spoke with staff member at [facility] who stated Dr. Keane Scrape only availability was tomorrow 02/18/21 at 07:45. Asked staff member to hold appointment so I could call patient back and confirm he would be able to make that appointment.   Spoke with patient and he confirmed ability to make tomorrow morning's appointment. Denied any other needs/concerns at this time, but knows to call clinic back should something arise in the future. [facility] and spoke with same staff member to confirm patient's appointment with Dr. Claudia Desanctis

## 2021-03-29 ENCOUNTER — Encounter: Payer: Self-pay | Admitting: Radiation Oncology

## 2021-03-29 ENCOUNTER — Ambulatory Visit
Admission: RE | Admit: 2021-03-29 | Discharge: 2021-03-29 | Disposition: A | Discharge status: Home / Self Care | Payer: 59 | Source: Ambulatory Visit | Attending: Radiation Oncology | Admitting: Radiation Oncology

## 2021-03-29 DIAGNOSIS — C61 Malignant neoplasm of prostate: Secondary | ICD-10-CM | POA: Diagnosis not present

## 2021-04-12 NOTE — Progress Notes (Signed)
  Radiation Oncology         [phone]) [phone] ________________________________  Name: IVOR BLOYD MRN: [id number]  Date: 03/29/2021  DOB: April 16, 1966  3D Planning Note   Prostate Brachytherapy Post-Implant Dosimetry  Diagnosis: 55 y.o. gentleman with Stage T1c adenocarcinoma of the prostate with Gleason score of 3+4, and PSA of 6.49.  Narrative: On a previous date, ANDREAS SOUVA returned following prostate seed implantation for post implant planning. He underwent CT scan complex simulation to delineate the three-dimensional structures of the pelvis and demonstrate the radiation distribution.  Since that time, the seed localization, and complex isodose planning with dose volume histograms have now been completed.  Results:   Prostate Coverage - The dose of radiation delivered to the 90% or more of the prostate gland (D90) was 120.74% of the prescription dose. This exceeds our goal of greater than 90%. Rectal Sparing - The volume of rectal tissue receiving the prescription dose or higher was 0.0 cc. This falls under our thresholds tolerance of 1.0 cc.  Impression: The prostate seed implant appears to show adequate target coverage and appropriate rectal sparing.  Plan:  The patient will continue to follow with urology for ongoing PSA determinations. I would anticipate a high likelihood for local tumor control with minimal risk for rectal morbidity.  ________________________________  Sheral Apley Tammi Klippel, M.D.

## 2021-04-29 NOTE — Progress Notes (Signed)
Cardiology Office Note:    Date:  04/30/2021   ID:  Randy Moore, DOB 1965-09-18, MRN [id number]  PCP:  Shanon Rosser, PA-C   [facility] Providers Cardiologist:  Sherren Mocha, MD Cardiology APP:  Sharmon Revere      Referring MD: Shanon Rosser, PA-C   Chief Complaint:  F/u for CAD    Patient Profile:    Randy Moore is a 55 y.o. male with:  Coronary artery disease S/p inferior MI (RV infarct) 08/2016 c/b hypotension/bradycardia >> PCI:  DES to RCA Staged PCI 08/2016:  DES to mid LAD and DES to RI; POBA to D2 unsuccessful Myoview 01/2019: EF 26, large anterior scar, no ischemia Ischemic CM HFmrEF (heart failure with mildly reduced ejection fraction)  Echocardiogram 2/19: EF 35-40 Echo 01/2019: EF 30-35, GR 1 DD Echocardiogram 10/21: EF 40-45, Gr 1 DD Hyperlipidemia  Hypertension  Hx of medication nonadherence Prostate CA Radiation Rx started 5/22   Prior CV studies: Echocardiogram 05/29/20 EF 40-45, Gr 1 DD, normal RVSF   Echocardiogram 02/18/2019 EF 30-35, Gr 1 DD, ant-sept and apical AK   Myoview 02/01/2019 EF 26, no ischemia, Lg scar (ant, ant-sept, inf, inf-sept, apical); High Risk   Echo 09/26/17 EF 35-40, ant-sept, ant, apical AK, Gr 1 DD   Echo 12/23/16 Mild conc LVH, EF 35-40, Gr 1 DD, inf-sept, ant-sept, ant, apical sept and ant AK   Echo 09/23/16 EF 35-40, inf-septal HK, apical septal and apical AK, ant and inf AK, ant-septal AK, Gr 1 diastolic dysfunction   PCI 09/22/16 3 x 38 mm Promus DES to mLAD Attempted POBA to oD2 (80 >> 80) 3 x 28 mm Promus DES to RI   LHC 09/21/16 LAD prox 99, ostial D1 80 RI prox 70 LCx ost 30, mid 70 RCA 100 EF 45-50 PCI: 2.5 x 20 mm Promus DES to pRCA   History of Present Illness: Randy Moore was last seen in 5/22.  He was having some chest pain that sounded non-cardiac.  I obtained a Chest CT that was neg for PE or other acute abnormality.  He returns for f/u.  He is here alone.  He continues to undergo radiation for  prostate CA.  He continues to play soccer and basketball on a regular basis.  He has not had chest pain, significant shortness of breath, syncope, orthopnea, leg edema.        Past Medical History:  Diagnosis Date   Acute right ventricular myocardial infarction ([place]) 09/21/2016   Cardiogenic shock ([place]) 09/21/2016   Chronic combined systolic and diastolic heart failure ([place]) 09/21/2016   Coronary artery disease involving native coronary artery of native heart without angina pectoris 09/21/2016   a - Inf and RV infarct (STEMI) 1/18: LHC - pLAD, oD1 80, pRI 70, oLCx 30, mLCx 70, RCA 100, EF 45-50 >> PCI:  2.5 x 20 mm Promus DES to pRCA // Staged PCI 09/22/16: 3 x 38 mm Promus DES to mLAD; Attempted POBA to oD2 (80 >> 80); 3 x 28 mm Promus DES to RI   Enlarged prostate    Hyperlipidemia 09/29/2016   Hypertension    Ischemic cardiomyopathy 09/23/2016   a - s/p Inf and RV infarct (STEMI) 1/18 // b - Echo 09/23/16: EF 35-40, inf-septal HK, apical septal and apical AK, ant and inf AK, ant-septal AK, Gr 1 diastolic dysfunction // c -Echo 5/18: mild conc LVH, EF 35-40, Gr 1 DD, inf-septal, ant-septal, ant, apical AK   Noncompliance with medication  regimen    Presence of drug coated stent in right coronary artery 09/21/2016   Prostate cancer [facility])    S/P angioplasty with stent, LAD and Ramus with DES 09/22/16 09/23/2016   STEMI involving oth coronary artery of inferior wall ([place]) 09/21/2016    Current Medications: Current Meds  Medication Sig   aspirin EC 81 MG EC tablet Take 1 tablet (81 mg total) by mouth daily.   atorvastatin (LIPITOR) 80 MG tablet Take 1 tablet (80 mg total) by mouth every evening.   carvedilol (COREG) 6.25 MG tablet Take 1 tablet (6.25 mg total) by mouth in the morning and at bedtime.   famotidine (PEPCID) 20 MG tablet TAKE 1 TABLET(20 MG) BY MOUTH TWICE DAILY   levothyroxine (SYNTHROID) 50 MCG tablet Take 50 mcg by mouth daily.   meloxicam (MOBIC) 7.5 MG tablet TAKE 1 TABLET(7.5 MG) BY  MOUTH DAILY   nitroGLYCERIN (NITROSTAT) 0.4 MG SL tablet PLACE 1 TABLET UNDER TONGUE EVERY 5 MINUTES FOR 3 DOSES AS NEEDED FOR CHEST PAIN   sacubitril-valsartan (ENTRESTO) 97-103 MG Take 1 tablet by mouth 2 (two) times daily.   tamsulosin (FLOMAX) 0.4 MG CAPS capsule Take 0.4 mg by mouth at bedtime.   traMADol (ULTRAM) 50 MG tablet Take 1 tablet (50 mg total) by mouth every 6 (six) hours as needed.     Allergies:   Patient has no known allergies.   Social History   Tobacco Use   Smoking status: Never   Smokeless tobacco: Never  Vaping Use   Vaping Use: Never used  Substance Use Topics   Alcohol use: No   Drug use: No     Family Hx: The patient's family history includes Heart attack in his father; Other in his mother. There is no history of Colon cancer, Liver cancer, Stomach cancer, Rectal cancer, or Esophageal cancer.  Review of Systems  Genitourinary:  Positive for frequency.    EKGs/Labs/Other Test Reviewed:    EKG:  EKG is not ordered today.  The ekg ordered today demonstrates n/a  Recent Labs: 12/28/2020: ALT 23; Hemoglobin 15.4; Platelets 178 02/05/2021: BUN 11; Creatinine, Ser 1.00; Potassium 3.8; Sodium 143   Recent Lipid Panel Lab Results  Component Value Date/Time   CHOL 130 04/10/2020 09:25 AM   TRIG 52 04/10/2020 09:25 AM   HDL 54 04/10/2020 09:25 AM   LDLCALC 64 04/10/2020 09:25 AM    Risk Assessment/Calculations:          Physical Exam:    VS:  BP 110/78   Pulse 68   Ht '5\' 8"'  (1.727 m)   Wt 186 lb (84.4 kg)   SpO2 98%   BMI 28.28 kg/m     Wt Readings from Last 3 Encounters:  04/30/21 186 lb (84.4 kg)  02/05/21 182 lb (82.6 kg)  12/29/20 179 lb 3.2 oz (81.3 kg)     Constitutional:      Appearance: Healthy appearance. Not in distress.  Neck:     Thyroid: No thyromegaly.     Vascular: JVD normal.  Pulmonary:     Effort: Pulmonary effort is normal.     Breath sounds: No wheezing. No rales.  Cardiovascular:     Normal rate. Regular  rhythm. Normal S1. Normal S2.      Murmurs: There is no murmur.  Edema:    Peripheral edema absent.  Abdominal:     Palpations: Abdomen is soft. There is no hepatomegaly.  Skin:    General: Skin is warm and dry.  Neurological:     General: No focal deficit present.     Mental Status: Alert and oriented to person, place and time.     Cranial Nerves: Cranial nerves are intact.         ASSESSMENT & PLAN:    1. Coronary artery disease involving native coronary artery of native heart without angina pectoris History of inferior MI in 1/18 treated with a DES to the RCA and staged PCI with DES to LAD and DES to the RI.  Attempted angioplasty to D2 was unsuccessful.  Myoview in 6/20 without ischemia.  He remains very active and is doing well without chest discomfort or significant shortness of breath.  He remains on a combination of aspirin, atorvastatin, carvedilol and Entresto.  Follow-up in 6 months.  2. HFmrEF (heart failure with mildly reduced EF) EF was as low as 26 on nuclear study in 2020.  Most recent echocardiogram in 10/21 demonstrated EF 40-45.  He is NYHA I.  Volume status is stable.  At this point, I do not think we should place him on spironolactone or SGLT2 inhibitor.  I would be concerned about dehydration given his active lifestyle.  He remains on carvedilol, Entresto.  Follow-up in 6 months.  3. Essential hypertension Blood pressure is well controlled on a combination of carvedilol, Entresto.  4. Mixed hyperlipidemia He remains on high intensity statin therapy with atorvastatin.  He is fasting today and follow-up CMET, lipids will be obtained.   Dispo:  Return in about 6 months (around 10/28/2021) for Routine follow up in 6 months with [facility]. .   Medication Adjustments/Labs and Tests Ordered: Current medicines are reviewed at length with the patient today.  Concerns regarding medicines are outlined above.  Tests Ordered: Orders Placed This Encounter  Procedures    Comp Met (CMET)   Lipid Profile    Medication Changes: No orders of the defined types were placed in this encounter.   Signed, [name], PA-C  04/30/2021 9:19 AM    [facility] [place], [place], [place]  [address] Phone: [phone]; Fax: [phone]

## 2021-04-30 ENCOUNTER — Ambulatory Visit (INDEPENDENT_AMBULATORY_CARE_PROVIDER_SITE_OTHER): Payer: 59 | Admitting: Physician Assistant

## 2021-04-30 ENCOUNTER — Other Ambulatory Visit: Payer: Self-pay

## 2021-04-30 ENCOUNTER — Encounter: Payer: Self-pay | Admitting: Physician Assistant

## 2021-04-30 VITALS — BP 110/78 | HR 68 | Ht 68.0 in | Wt 186.0 lb

## 2021-04-30 DIAGNOSIS — E782 Mixed hyperlipidemia: Secondary | ICD-10-CM

## 2021-04-30 DIAGNOSIS — I10 Essential (primary) hypertension: Secondary | ICD-10-CM

## 2021-04-30 DIAGNOSIS — I2510 Atherosclerotic heart disease of native coronary artery without angina pectoris: Secondary | ICD-10-CM | POA: Diagnosis not present

## 2021-04-30 DIAGNOSIS — I5020 Unspecified systolic (congestive) heart failure: Secondary | ICD-10-CM | POA: Diagnosis not present

## 2021-04-30 LAB — COMPREHENSIVE METABOLIC PANEL
ALT: 26 IU/L (ref 0–44)
AST: 24 IU/L (ref 0–40)
Albumin/Globulin Ratio: 1.9 (ref 1.2–2.2)
Albumin: 4.4 g/dL (ref 3.8–4.9)
Alkaline Phosphatase: 81 IU/L (ref 44–121)
BUN/Creatinine Ratio: 7 — ABNORMAL LOW (ref 9–20)
BUN: 8 mg/dL (ref 6–24)
Bilirubin Total: 1 mg/dL (ref 0.0–1.2)
CO2: 22 mmol/L (ref 20–29)
Calcium: 9.3 mg/dL (ref 8.7–10.2)
Chloride: 101 mmol/L (ref 96–106)
Creatinine, Ser: 1.12 mg/dL (ref 0.76–1.27)
Globulin, Total: 2.3 g/dL (ref 1.5–4.5)
Glucose: 102 mg/dL — ABNORMAL HIGH (ref 65–99)
Potassium: 3.8 mmol/L (ref 3.5–5.2)
Sodium: 138 mmol/L (ref 134–144)
Total Protein: 6.7 g/dL (ref 6.0–8.5)
eGFR: 77 mL/min/{1.73_m2} (ref >59)

## 2021-04-30 LAB — LIPID PANEL
Chol/HDL Ratio: 2.5 ratio (ref 0.0–5.0)
Cholesterol, Total: 125 mg/dL (ref 100–199)
HDL: 50 mg/dL (ref >39)
LDL Chol Calc (NIH): 63 mg/dL (ref 0–99)
Triglycerides: 55 mg/dL (ref 0–149)
VLDL Cholesterol Cal: 12 mg/dL (ref 5–40)

## 2021-04-30 NOTE — Patient Instructions (Signed)
Medication Instructions:  Your physician recommends that you continue on your current medications as directed. Please refer to the Current Medication list given to you today.  *If you need a refill on your cardiac medications before your next appointment, please call your pharmacy*  Lab Work: TODAY!!!!CMET/LIPID  If you have labs (blood work) drawn today and your tests are completely normal, you will receive your results only by: [place] (if you have MyChart) OR A paper copy in the mail If you have any lab test that is abnormal or we need to change your treatment, we will call you to review the results.  Testing/Procedures:  -NONE  Follow-Up: At [facility], you and your health needs are our priority.  As part of our continuing mission to provide you with exceptional heart care, we have created designated Provider Care Teams.  These Care Teams include your primary Cardiologist (physician) and Advanced Practice Providers (APPs -  Physician Assistants and Nurse Practitioners) who all work together to provide you with the care you need, when you need it.  We recommend signing up for the patient portal called "MyChart".  Sign up information is provided on this After Visit Summary.  MyChart is used to connect with patients for Virtual Visits (Telemedicine).  Patients are able to view lab/test results, encounter notes, upcoming appointments, etc.  Non-urgent messages can be sent to your provider as well.   To learn more about what you can do with MyChart, go to [email or web address].    Your next appointment:   6 month(s)  The format for your next appointment:   In Person  Provider:   [name], PA-C   Other Instructions  -NONE

## 2021-07-31 ENCOUNTER — Other Ambulatory Visit: Payer: Self-pay | Admitting: Physician Assistant

## 2021-09-08 ENCOUNTER — Telehealth: Payer: Self-pay | Admitting: Cardiovascular Disease

## 2021-09-08 NOTE — Telephone Encounter (Signed)
If chest pain is persistent/severe, would go to ED. Otherwise, would try to get him in to be seen tomorrow with anyone (DOD, APP). [name], PA-C    09/08/2021 4:42 PM

## 2021-09-08 NOTE — Telephone Encounter (Signed)
Late Entry: Took call directly when pt called in. Pt complaining of chest discomfort 5-6 out of 10.   Reports that he thinks he pulled something playing soccer but not sure. Denies radiating pain. Pain is upper left side. Pt has not taken nitro. Advised that is sounds musculoskeletal, but to take nitro to see if relieves pain. Advised that he should go to ED for evaluation if worsens/changes in nature and/or nitro relieves pain. Aware will forward to MD for advisement, thinking pt needs f/u appt in our office for further evaluation......Marland Kitchen

## 2021-09-08 NOTE — Telephone Encounter (Signed)
Pt added to DOD scheduled tomorrow as recommended. Patient verbalized understanding and agreeable to plan.

## 2021-09-08 NOTE — Telephone Encounter (Signed)
Pt c/o of Chest Pain: STAT if CP now or developed within 24 hours  1. Are you having CP right now?  Yes   2. Are you experiencing any other symptoms (ex. SOB, nausea, vomiting, sweating)?  No   3. How long have you been experiencing CP?  For the past month  4. Is your CP continuous or coming and going?  Coming and going  5. Have you taken Nitroglycerin?   Patient states he has nitro, but he doesn't think the CP has gotten bad enough to take any

## 2021-09-09 ENCOUNTER — Ambulatory Visit: Payer: 59 | Admitting: Cardiovascular Disease

## 2021-09-09 ENCOUNTER — Encounter: Payer: Self-pay | Admitting: Cardiovascular Disease

## 2021-09-09 ENCOUNTER — Other Ambulatory Visit: Payer: Self-pay

## 2021-09-09 VITALS — BP 140/84 | HR 83 | Ht 68.0 in | Wt 184.0 lb

## 2021-09-09 DIAGNOSIS — R0789 Other chest pain: Secondary | ICD-10-CM

## 2021-09-09 DIAGNOSIS — I5020 Unspecified systolic (congestive) heart failure: Secondary | ICD-10-CM

## 2021-09-09 DIAGNOSIS — E782 Mixed hyperlipidemia: Secondary | ICD-10-CM | POA: Diagnosis not present

## 2021-09-09 DIAGNOSIS — I2510 Atherosclerotic heart disease of native coronary artery without angina pectoris: Secondary | ICD-10-CM

## 2021-09-09 DIAGNOSIS — I10 Essential (primary) hypertension: Secondary | ICD-10-CM

## 2021-09-09 NOTE — Patient Instructions (Signed)
Medication Instructions:  Your physician recommends that you continue on your current medications as directed. Please refer to the Current Medication list given to you today.  *If you need a refill on your cardiac medications before your next appointment, please call your pharmacy*  Lab Work: If you have labs (blood work) drawn today and your tests are completely normal, you will receive your results only by: [place] (if you have MyChart) OR A paper copy in the mail If you have any lab test that is abnormal or we need to change your treatment, we will call you to review the results.  Testing/Procedures: None ordered today.  Follow-Up: At [facility], you and your health needs are our priority.  As part of our continuing mission to provide you with exceptional heart care, we have created designated Provider Care Teams.  These Care Teams include your primary Cardiologist (physician) and Advanced Practice Providers (APPs -  Physician Assistants and Nurse Practitioners) who all work together to provide you with the care you need, when you need it.  We recommend signing up for the patient portal called "MyChart".  Sign up information is provided on this After Visit Summary.  MyChart is used to connect with patients for Virtual Visits (Telemedicine).  Patients are able to view lab/test results, encounter notes, upcoming appointments, etc.  Non-urgent messages can be sent to your provider as well.   To learn more about what you can do with MyChart, go to [email or web address].    Your next appointment:   6 month(s)  The format for your next appointment:   In Person  Provider:   Sherren Mocha, MD {

## 2021-09-09 NOTE — Progress Notes (Signed)
Cardiology Office Note:    Date:  09/09/2021   ID:  Jamal Maes, DOB 19-Nov-1965, MRN [id number]  PCP:  Shanon Rosser, PA-C   [place] Providers Cardiologist:  Sherren Mocha, MD Cardiology APP:  Sh[place]on [place]       Referring MD: Shanon Rosser, [place]   Chief Complaint:  No chief complaint on file.    Patient Profile:    Randy Moore is a 56 y.o. male with:  Coronary artery disease S/p inferior MI (RV infarct) 08/2016 c/b hypotension/bradycardia >> PCI:  DES to RCA Staged PCI 08/2016:  DES to mid LAD and DES to RI; POBA to D2 unsuccessful Myoview 01/2019: EF 26, large anterior scar, no ischemia Ischemic CM HFmrEF (heart failure with mildly reduced ejection fraction)  Echocardiogram 2/19: EF 35-40 Echo 01/2019: EF 30-35, GR 1 DD Echocardiogram 10/21: EF 40-45, Gr 1 DD Hyperlipidemia  Hypertension  Hx of medication nonadherence Prostate CA Radiation Rx started 5/22   Prior CV studies: Echocardiogram 05/29/20 EF 40-45, Gr 1 DD, normal RVSF   Echocardiogram 02/18/2019 EF 30-35, Gr 1 DD, ant-sept and apical AK   Myoview 02/01/2019 EF 26, no ischemia, Lg scar (ant, ant-sept, inf, inf-sept, apical); High Risk   Echo 09/26/17 EF 35-40, ant-sept, ant, apical AK, Gr 1 DD   Echo 12/23/16 Mild conc LVH, EF 35-40, Gr 1 DD, inf-sept, ant-sept, ant, apical sept and ant AK   Echo 09/23/16 EF 35-40, inf-septal HK, apical septal and apical AK, ant and inf AK, ant-septal AK, Gr 1 diastolic dysfunction   PCI 09/22/16 3 x 38 mm Promus DES to mLAD Attempted POBA to oD2 (80 >> 80) 3 x 28 mm Promus DES to RI   LHC 09/21/16 LAD prox 99, ostial D1 80 RI prox 70 LCx ost 30, mid 70 RCA 100 EF 45-50 PCI: 2.5 x 20 mm Promus DES to pRCA   History of Present Illness: 56 y.o. patient of Dr Burt Knack added on to DOD schedule for chest pain   Mr. Vosler was last seen by PA [name] Weave in May He was having some chest    He continues to undergo radiation for prostate CA.  He continues to  play soccer and basketball on a regular basis.  Called office yesterday indicating SSCP Thinks he pulled a muscle in his chest playing soccer a few weeks ago and is still getting some lett sided chest pain  Last myovue 02/01/19 non ischemic infarct in inferior and anterior territories EF 26% F/U echo 6/29 suggested 30-35% but TTE 05/30/20 was 40-45% and has not been referred for AICD QRS only 108 msec   His pain is intermittent in the same area he was injured. Ibuprofen helps and he has not taken nitro for it He can play soccer with no pain but then has some soreness afterwards     Past Medical History:  Diagnosis Date   Acute right ventricular myocardial infarction ([place]) 09/21/2016   Cardiogenic shock ([place]) 09/21/2016   Chronic combined systolic and diastolic heart failure ([place]) 09/21/2016   Coronary artery disease involving native coronary artery of native heart without angina pectoris 09/21/2016   a - Inf and RV infarct (STEMI) 1/18: LHC - pLAD, oD1 80, pRI 70, oLCx 30, mLCx 70, RCA 100, EF 45-50 >> PCI:  2.5 x 20 mm Promus DES to pRCA // Staged PCI 09/22/16: 3 x 38 mm Promus DES to mLAD; Attempted POBA to oD2 (80 >> 80); 3 x 28 mm Promus DES  to RI   Enlarged prostate    Hyperlipidemia 09/29/2016   Hypertension    Ischemic cardiomyopathy 09/23/2016   a - s/p Inf and RV infarct (STEMI) 1/18 // b - Echo 09/23/16: EF 35-40, inf-septal HK, apical septal and apical AK, ant and inf AK, ant-septal AK, Gr 1 diastolic dysfunction // c -Echo 5/18: mild conc LVH, EF 35-40, Gr 1 DD, inf-septal, ant-septal, ant, apical AK   Noncompliance with medication regimen    Presence of drug coated stent in right coronary artery 09/21/2016   Prostate cancer ([place])    S/P angioplasty with stent, LAD and Ramus with DES 09/22/16 09/23/2016   STEMI involving oth coronary artery of inferior wall ([place]) 09/21/2016    Current Medications: No outpatient medications have been marked as taking for the 09/09/21 encounter (Appointment) with  Josue Hector, MD.     Allergies:   Patient has no known allergies.   Social History   Tobacco Use   Smoking status: Never   Smokeless tobacco: Never  Vaping Use   Vaping Use: Never used  Substance Use Topics   Alcohol use: No   Drug use: No     Family Hx: The patient's family history includes Heart attack in his father; Other in his mother. There is no history of Colon cancer, Liver cancer, Stomach cancer, Rectal cancer, or Esophageal cancer.  Review of Systems  Genitourinary:  Positive for frequency.    EKGs/Labs/Other Test Reviewed:    EKG:  SR anterolateral T Wave changes chronic 02/05/21   Recent Labs: 12/28/2020: Hemoglobin 15.4; Platelets 178 04/30/2021: ALT 26; BUN 8; Creatinine, Ser 1.12; Potassium 3.8; Sodium 138   Recent Lipid Panel Lab Results  Component Value Date/Time   CHOL 125 04/30/2021 09:21 AM   TRIG 55 04/30/2021 09:21 AM   HDL 50 04/30/2021 09:21 AM   LDLCALC 63 04/30/2021 09:21 AM    Risk Assessment/Calculations:          Physical Exam:    VS:  There were no vitals taken for this visit.    Wt Readings from Last 3 Encounters:  04/30/21 186 lb (84.4 kg)  02/05/21 182 lb (82.6 kg)  12/29/20 179 lb 3.2 oz (81.3 kg)     Affect appropriate Healthy:  appears stated age HEENT: normal Neck supple with no adenopathy JVP normal no bruits no thyromegaly Lungs clear with no wheezing and good diaphragmatic motion Heart:  S1/S2 no murmur, no rub, gallop or click PMI normal Abdomen: benighn, BS positve, no tenderness, no AAA no bruit.  No HSM or HJR Distal pulses intact with no bruits No edema Neuro non-focal Skin warm and dry No muscular weakness        ASSESSMENT & PLAN:    1. Coronary artery disease involving native coronary artery of native heart without angina pectoris History of inferior MI in 1/18 treated with a DES to the RCA and staged PCI with DES to LAD and DES to the RI.  Attempted angioplasty to D2 was unsuccessful.  Myoview  in 6/20 without ischemia.  He remains very active with atypical muscular pain  in area of previous injury from elbow impact. Pain is not cardiac Can continue to use Ibuprofen as needed No indicatio nfor cath or f/u stress testing   2. HFmrEF (heart failure with mildly reduced EF) EF was as low as 26 on nuclear study in 2020.  Most recent echocardiogram in 10/21 demonstrated EF 40-45.  He is NYHA I.  Volume status is  stable.  At this point, I do not think we should place him on spironolactone or SGLT2 inhibitor.  I would be concerned about dehydration given his active lifestyle.  He remains on carvedilol, Entresto.  Follow-up in 6 months. Consider MRI to better quantify EF but he is functional class one   3. Essential hypertension Blood pressure is well controlled on a combination of carvedilol, Entresto.  4. Mixed hyperlipidemia He remains on high intensity statin therapy with atorvastatin.  LDL at goal 63 on 04/30/21    Dispo:   F/U Dr Burt Knack in 6 months   Medication Adjustments/Labs and Tests Ordered: Current medicines are reviewed at length with the patient today.  Concerns regarding medicines are outlined above.  Tests Ordered: No orders of the defined types were placed in this encounter.   Medication Changes: No orders of the defined types were placed in this encounter.   Signed, Jenkins Rouge, MD  09/09/2021 11:09 AM    [facility] [address], [place], [place]  [address] Phone: [phone]; Fax: [phone]

## 2021-10-03 IMAGING — CT CT ANGIO CHEST
2 of 8 series · 19 of 46 positions shown · IV contrast (OMNIPAQUE 350)
Comparison: None.

CLINICAL DATA: Chest pain

EXAM:
CT ANGIOGRAPHY CHEST WITH CONTRAST
TECHNIQUE: Multidetector CT imaging of the chest was performed using the
standard protocol during bolus administration of intravenous
contrast. Multiplanar CT image reconstructions and MIPs were
obtained to evaluate the vascular anatomy.
CONTRAST:  80mL OMNIPAQUE IOHEXOL 350 MG/ML SOLN

[Series 5: thins · axial · 0.67mm/px · z∈[-304,-44]mm · 16 of 295 slices shown]
[im 17/295  lung]
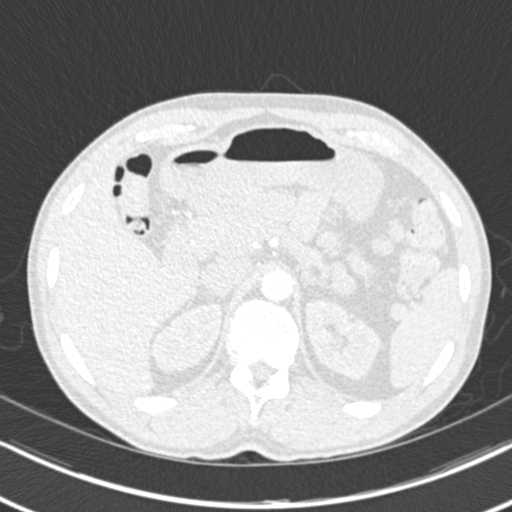
[im 33/295  soft-tissue]
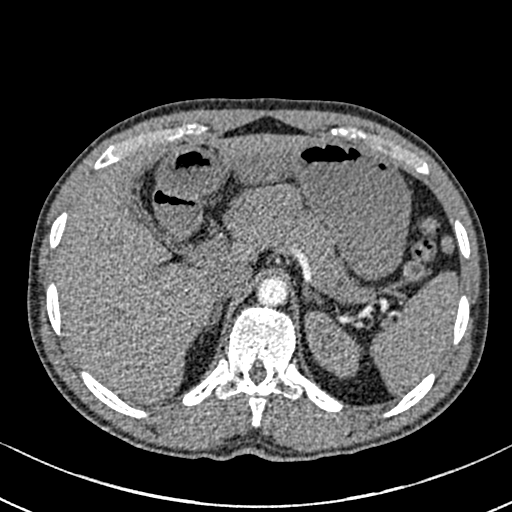
[im 50/295  lung]
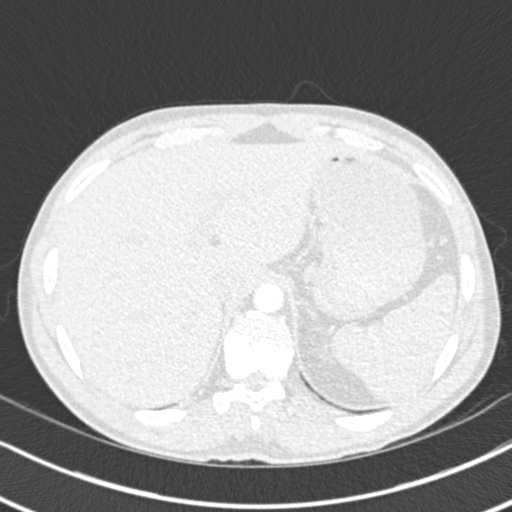
[im 66/295  soft-tissue]
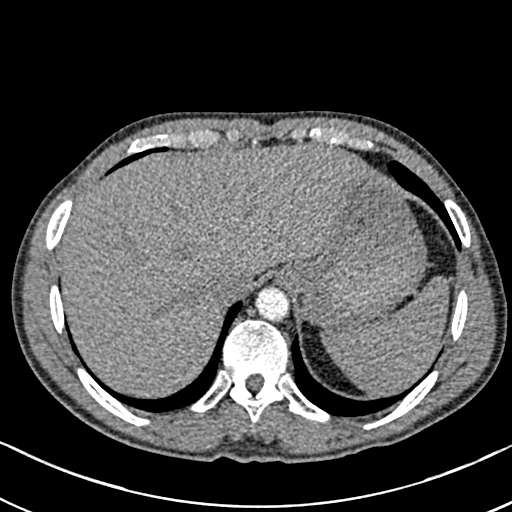
[im 82/295  lung]
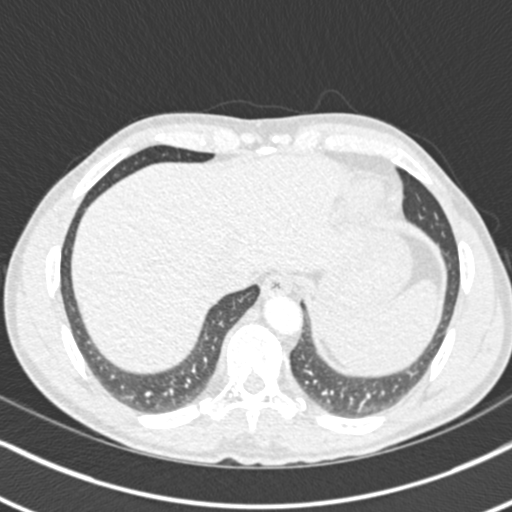
[im 99/295  soft-tissue]
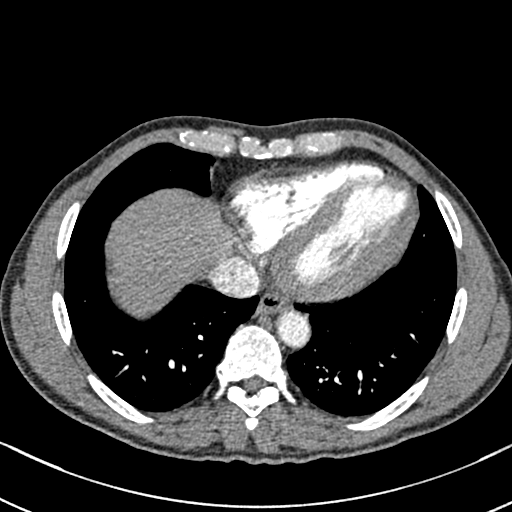
[im 115/295  lung]
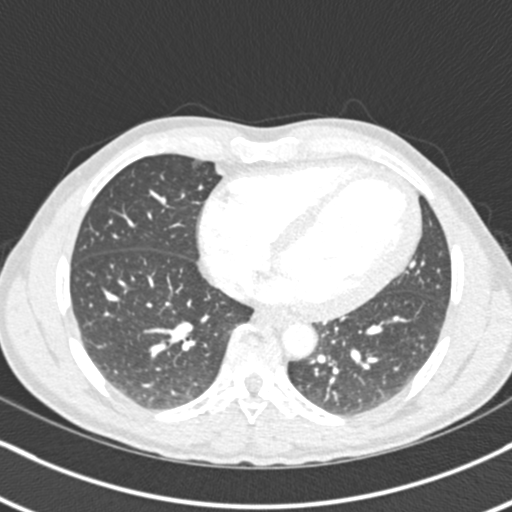
[im 131/295  soft-tissue]
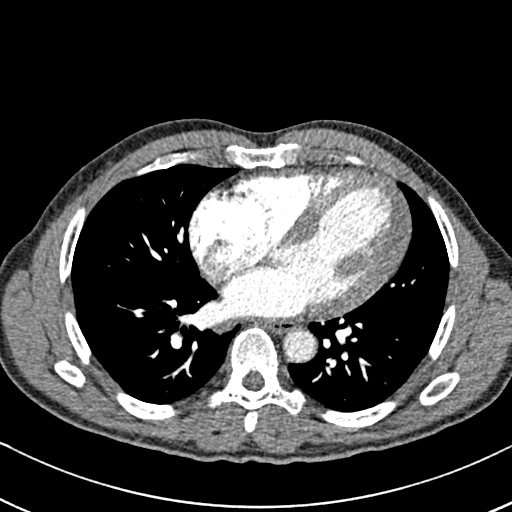
[im 164/295  lung]
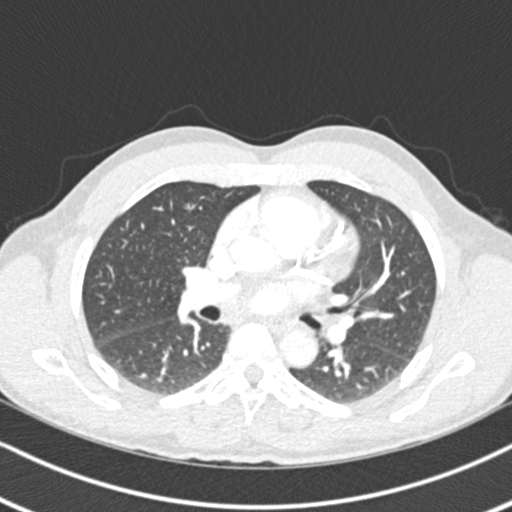
[im 180/295  soft-tissue]
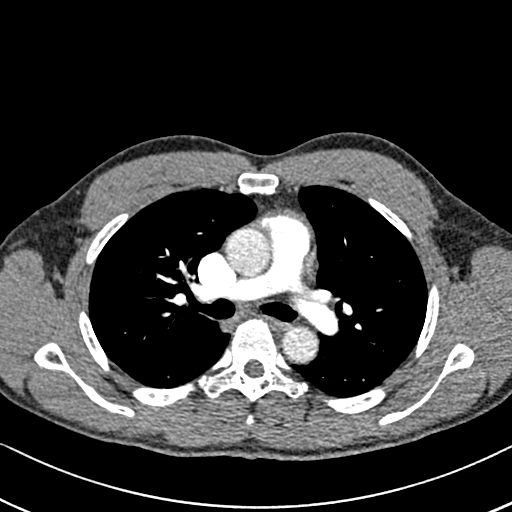
[im 197/295  lung]
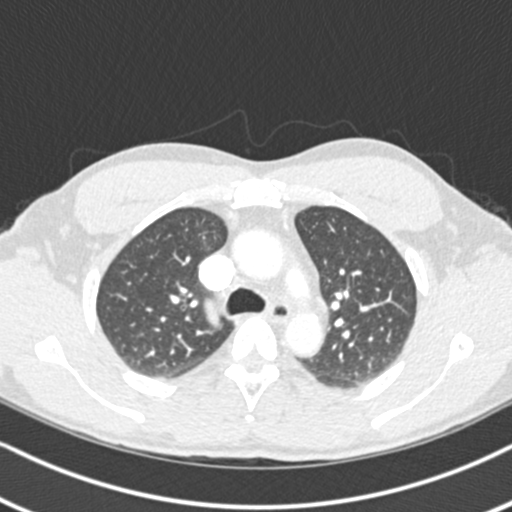
[im 213/295  soft-tissue]
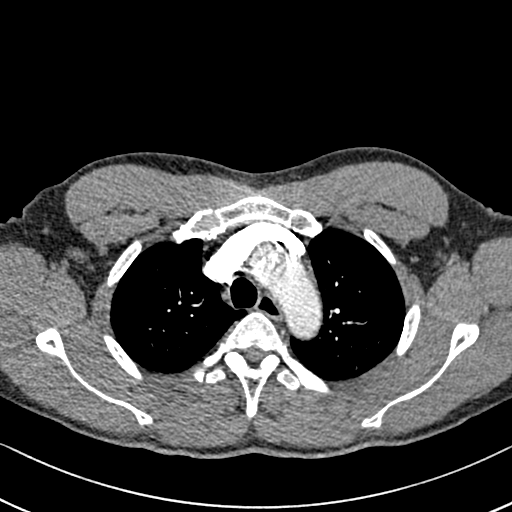
[im 229/295  lung]
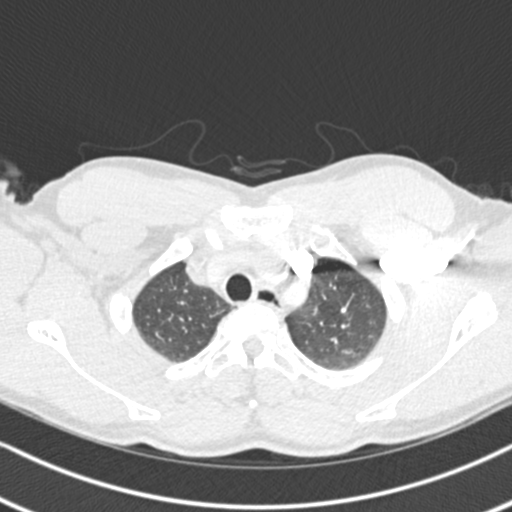
[im 245/295  soft-tissue]
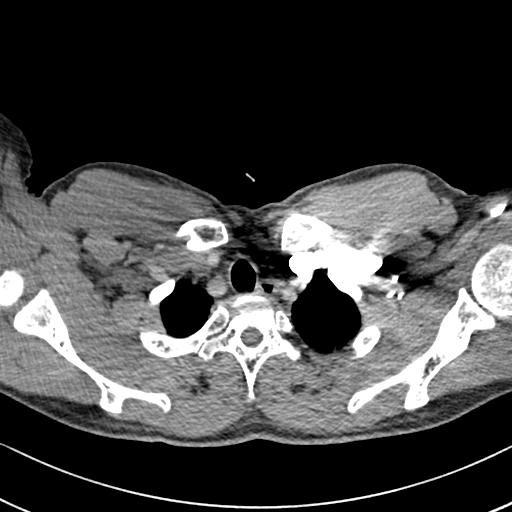
[im 262/295  lung]
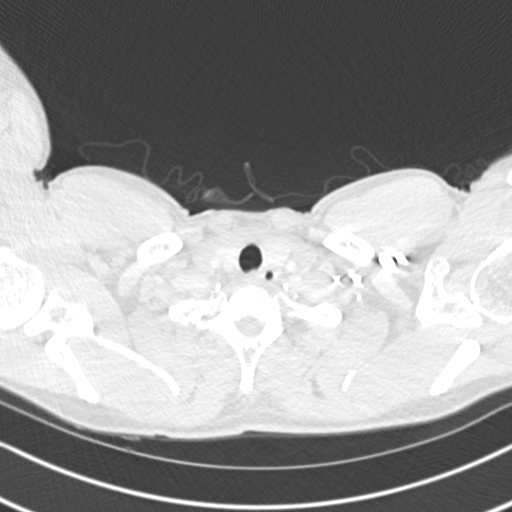
[im 278/295  soft-tissue]
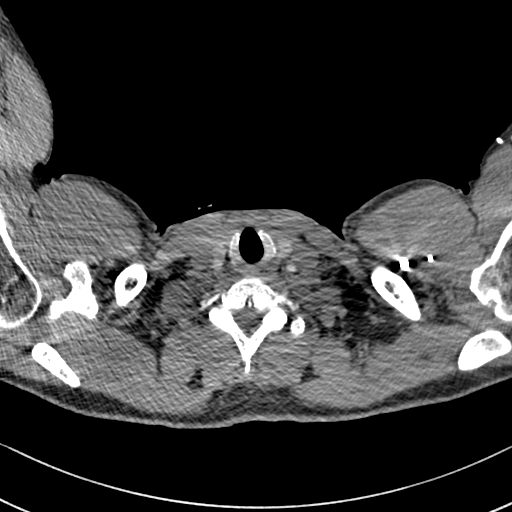

[Series 7: coronal mpr · coronal · 0.59mm/px · 3 of 111 slices shown]
[im 28/111  soft-tissue]
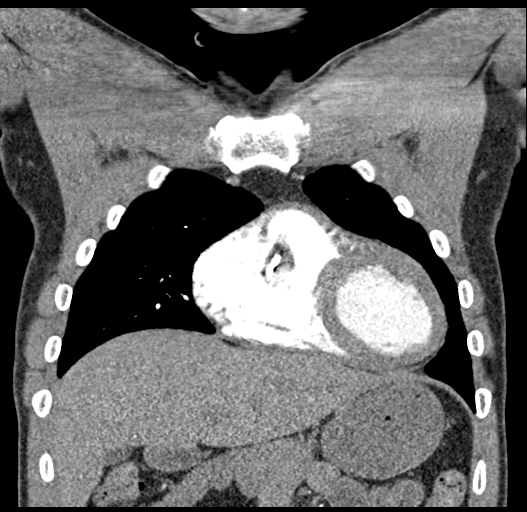
[im 56/111  soft-tissue]
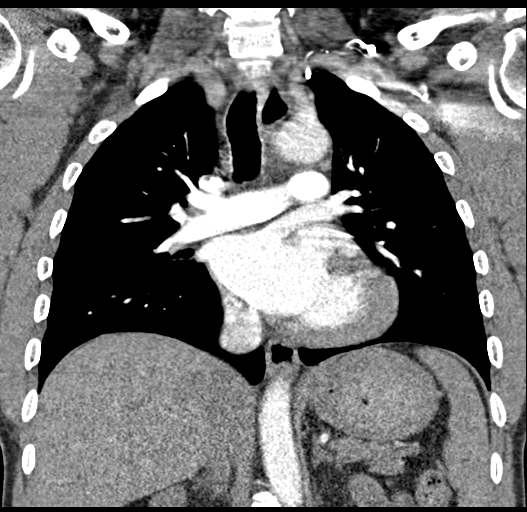
[im 83/111  soft-tissue]
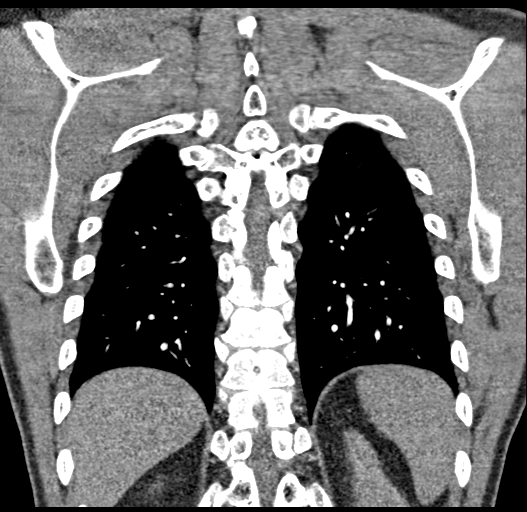

[19 of 46 positions shown; findings below may reference images not displayed]

FINDINGS: Cardiovascular: Satisfactory opacification of the pulmonary arteries
to the segmental level. No evidence of pulmonary embolism.
Top-normal heart size. Three-vessel coronary artery calcification.
No pericardial effusion.

Mediastinum/Nodes: No enlarged lymph nodes. Thyroid and esophagus
are unremarkable.

Lungs/Pleura: No consolidation or mass. No pleural effusion. No
pneumothorax.

Upper Abdomen: No acute abnormality.

Musculoskeletal: No acute osseous abnormality.

Review of the MIP images confirms the above findings.
IMPRESSION: No evidence of acute pulmonary embolism or other acute abnormality.

Coronary artery calcification.

## 2021-10-21 ENCOUNTER — Telehealth: Payer: Self-pay | Admitting: *Deleted

## 2021-10-21 NOTE — Telephone Encounter (Signed)
Left detailed vm moved pts appt to March 22 @ 8:50 am due to provider bereavement.  ?

## 2021-10-22 ENCOUNTER — Ambulatory Visit: Payer: 59 | Admitting: Physician Assistant

## 2021-11-09 ENCOUNTER — Ambulatory Visit: Payer: 59 | Admitting: Cardiology

## 2021-11-09 ENCOUNTER — Encounter: Payer: Self-pay | Admitting: Cardiology

## 2021-11-09 ENCOUNTER — Other Ambulatory Visit: Payer: Self-pay

## 2021-11-09 VITALS — BP 126/64 | HR 60 | Ht 68.0 in | Wt 184.0 lb

## 2021-11-09 DIAGNOSIS — I10 Essential (primary) hypertension: Secondary | ICD-10-CM | POA: Diagnosis not present

## 2021-11-09 DIAGNOSIS — I5042 Chronic combined systolic (congestive) and diastolic (congestive) heart failure: Secondary | ICD-10-CM

## 2021-11-09 DIAGNOSIS — Z5181 Encounter for therapeutic drug level monitoring: Secondary | ICD-10-CM | POA: Diagnosis not present

## 2021-11-09 DIAGNOSIS — I2510 Atherosclerotic heart disease of native coronary artery without angina pectoris: Secondary | ICD-10-CM

## 2021-11-09 DIAGNOSIS — E782 Mixed hyperlipidemia: Secondary | ICD-10-CM | POA: Diagnosis not present

## 2021-11-09 DIAGNOSIS — I255 Ischemic cardiomyopathy: Secondary | ICD-10-CM

## 2021-11-09 MED ORDER — ENTRESTO 97-103 MG PO TABS: 1.0000 | ORAL_TABLET | Freq: Two times a day (BID) | ORAL | 3 refills | Status: DC

## 2021-11-09 MED ORDER — FAMOTIDINE 20 MG PO TABS: ORAL_TABLET | ORAL | 3 refills | Status: DC

## 2021-11-09 NOTE — Patient Instructions (Signed)
Medication Instructions:  ?Your physician recommends that you continue on your current medications as directed. Please refer to the Current Medication list given to you today. ?REFILLED: Entresto and Pepcid ? ?*If you need a refill on your cardiac medications before your next appointment, please call your pharmacy* ? ? ?Lab Work: ?TODAY: CBC, BMP ?If you have labs (blood work) drawn today and your tests are completely normal, you will receive your results only by: ?MyChart Message (if you have MyChart) OR ?A paper copy in the mail ?If you have any lab test that is abnormal or we need to change your treatment, we will call you to review the results. ? ? ?Testing/Procedures: ?NONE ? ? ?Follow-Up: ?At [facility], you and your health needs are our priority.  As part of our continuing mission to provide you with exceptional heart care, we have created designated Provider Care Teams.  These Care Teams include your primary Cardiologist (physician) and Advanced Practice Providers (APPs -  Physician Assistants and Nurse Practitioners) who all work together to provide you with the care you need, when you need it. ?  ? ?Your next appointment:   ?1 year(s) ? ?The format for your next appointment:   ?In Person ? ?Provider:   ?Sherren Mocha, MD   ? ?

## 2021-11-09 NOTE — Progress Notes (Signed)
?  ?Cardiology Office Note ? ? ?Date:  11/09/2021  ? ?ID:  Randy Moore, DOB 07/19/1966, MRN [id number] ? ?PCP:  Shanon Rosser, PA-C  ?Cardiologist:  Dr. Burt Knack, MD  ? ?Chief Complaint  ?Patient presents with  ? Follow-up  ? ?History of Present Illness: ?Randy Moore is a 56 y.o. male who presents for CAD follow up, seen for Dr. Burt Knack.  ? ?Randy Moore has a hx of CAD s/p inferior MI (RV infarct) 08/2016 c/b hypotension/bradycardia with PCI/DES to RCA and staged PCI/DES to mid LAD and DES to RI; POBA to D2 unsuccessful. Follow up Myoview 01/2019 with EF at 26% and large anterior scar, but no ischemia. Also a hx of ischemic CM, HLD, HTN, and prostate CA s/p radiation 5/22.  ? ?He was recently seen 08/2021 as an add-on patient to DOD schedule for chest pain. At that time, he was still very active with playing soccer and basketball. He called the office with new SSCP. He felt that it may have been related to a recent chest injury. Ibuprofen helped. It was not felt to be cardiac in etiology and he was encouraged to continue Ibuprofen as needed for muscle strain.  ? ?Today he reports that the chest pain has improved and will only occur occasionally. The pain is not exertional and has no associated symptoms. It is located on his left mid axillary line. He otherwise has no complaints. He continues to be very active with sports without complication. He is tolerating medications well with no side effects. He has no SOB, LE edema, orthopnea, dizziness, or syncope.  ? ?Past Medical History:  ?Diagnosis Date  ? Acute right ventricular myocardial infarction ([place]) 09/21/2016  ? Cardiogenic shock ([place]) 09/21/2016  ? Chronic combined systolic and diastolic heart failure ([place]) 09/21/2016  ? Coronary artery disease involving native coronary artery of native heart without angina pectoris 09/21/2016  ? a - Inf and RV infarct (STEMI) 1/18: LHC - pLAD, oD1 80, pRI 70, oLCx 30, mLCx 70, RCA 100, EF 45-50 >> PCI:  2.5 x 20 mm Promus DES to pRCA //  Staged PCI 09/22/16: 3 x 38 mm Promus DES to mLAD; Attempted POBA to oD2 (80 >> 80); 3 x 28 mm Promus DES to RI  ? Enlarged prostate   ? Hyperlipidemia 09/29/2016  ? Hypertension   ? Ischemic cardiomyopathy 09/23/2016  ? a - s/p Inf and RV infarct (STEMI) 1/18 // b - Echo 09/23/16: EF 35-40, inf-septal HK, apical septal and apical AK, ant and inf AK, ant-septal AK, Gr 1 diastolic dysfunction // c -Echo 5/18: mild conc LVH, EF 35-40, Gr 1 DD, inf-septal, ant-septal, ant, apical AK  ? Noncompliance with medication regimen   ? Presence of drug coated stent in right coronary artery 09/21/2016  ? Prostate cancer ([place])   ? S/P angioplasty with stent, LAD and Ramus with DES 09/22/16 09/23/2016  ? STEMI involving oth coronary artery of inferior wall ([place]) 09/21/2016  ? ? ?Past Surgical History:  ?Procedure Laterality Date  ? CARDIAC CATHETERIZATION N/A 09/21/2016  ? Procedure: Left Heart Cath and Coronary Angiography;  Surgeon: Sherren Mocha, MD;  Location: [place] [facility];  Service: Cardiovascular;  Laterality: N/A;  ? CARDIAC CATHETERIZATION N/A 09/21/2016  ? Procedure: Coronary Stent Intervention;  Surgeon: Sherren Mocha, MD;  Location: [facility];  Service: Cardiovascular;  Laterality: N/A;  Prox RCA  ? CARDIAC CATHETERIZATION N/A 09/22/2016  ? Procedure: Coronary CTO Intervention;  Surgeon: Burnell Blanks, MD;  Location: [place] [facility];  Service: Cardiovascular;  Laterality: N/A;  ? COLONOSCOPY WITH PROPOFOL N/A 12/16/2019  ? Procedure: COLONOSCOPY WITH PROPOFOL;  Surgeon: Mauri Pole, MD;  Location: [facility];  Service: Endoscopy;  Laterality: N/A;  ? POLYPECTOMY  12/16/2019  ? Procedure: POLYPECTOMY;  Surgeon: Mauri Pole, MD;  Location: [facility];  Service: Endoscopy;;  ? PROSTATE BIOPSY    ? RADIOACTIVE SEED IMPLANT N/A 12/29/2020  ? Procedure: RADIOACTIVE SEED IMPLANT/BRACHYTHERAPY IMPLANT;  Surgeon: Robley Fries, MD;  Location: [facility];  Service: Urology;   Laterality: N/A;  ? SPACE OAR INSTILLATION N/A 12/29/2020  ? Procedure: SPACE OAR INSTILLATION;  Surgeon: Robley Fries, MD;  Location: [facility];  Service: Urology;  Laterality: N/A;  ? ? ? ?Current Outpatient Medications  ?Medication Sig Dispense Refill  ? aspirin EC 81 MG EC tablet Take 1 tablet (81 mg total) by mouth daily.    ? atorvastatin (LIPITOR) 80 MG tablet TAKE 1 TABLET(80 MG) BY MOUTH EVERY EVENING 90 tablet 1  ? carvedilol (COREG) 6.25 MG tablet Take 1 tablet (6.25 mg total) by mouth in the morning and at bedtime. 180 tablet 3  ? levothyroxine (SYNTHROID) 50 MCG tablet Take 50 mcg by mouth daily.    ? meloxicam (MOBIC) 7.5 MG tablet TAKE 1 TABLET(7.5 MG) BY MOUTH DAILY 30 tablet 0  ? nitroGLYCERIN (NITROSTAT) 0.4 MG SL tablet PLACE 1 TABLET UNDER TONGUE EVERY 5 MINUTES FOR 3 DOSES AS NEEDED FOR CHEST PAIN 25 tablet 4  ? tamsulosin (FLOMAX) 0.4 MG CAPS capsule Take 0.4 mg by mouth at bedtime.    ? traMADol (ULTRAM) 50 MG tablet Take 1 tablet (50 mg total) by mouth every 6 (six) hours as needed. 10 tablet 0  ? famotidine (PEPCID) 20 MG tablet TAKE 1 TABLET(20 MG) BY MOUTH TWICE DAILY 180 tablet 3  ? sacubitril-valsartan (ENTRESTO) 97-103 MG Take 1 tablet by mouth 2 (two) times daily. 180 tablet 3  ? ?No current facility-administered medications for this visit.  ? ? ?Allergies:   Patient has no known allergies.  ? ? ?Social History:  The patient  reports that he has never smoked. He has never used smokeless tobacco. He reports that he does not drink alcohol and does not use drugs.  ? ?Family History:  The patient's family history includes Heart attack in his father; Other in his mother.  ? ?ROS:  Please see the history of present illness.    ? ?PHYSICAL EXAM: ?VS:  BP 126/64   Pulse 60   Ht '5\' 8"'$  (1.727 m)   Wt 184 lb (83.5 kg)   SpO2 99%   BMI 27.98 kg/m?  , BMI Body mass index is 27.98 kg/m?. ? ?General: Well developed, well nourished, NAD ?Lungs:Clear to ausculation  bilaterally. No wheezes, rales, or rhonchi. Breathing is unlabored. ?Cardiovascular: RRR with S1 S2. No murmurs ?Extremities: No edema.  ?Neuro: Alert and oriented. No focal deficits. No facial asymmetry. MAE spontaneously. ?Psych: Responds to questions appropriately with normal affect.   ? ?EKG:  EKG is not ordered today. ? ?Recent Labs: ?12/28/2020: Hemoglobin 15.4; Platelets 178 ?04/30/2021: ALT 26; BUN 8; Creatinine, Ser 1.12; Potassium 3.8; Sodium 138  ? ? ?Lipid Panel ?   ?Component Value Date/Time  ? CHOL 125 04/30/2021 0921  ? TRIG 55 04/30/2021 0921  ? HDL 50 04/30/2021 0921  ? CHOLHDL 2.5 04/30/2021 0921  ? CHOLHDL 3.6 09/21/2016 0346  ? VLDL 6 09/21/2016 0346  ?  [place] 63 04/30/2021 0921  ? ?  ?Wt Readings from Last 3 Encounters:  ?11/09/21 184 lb (83.5 kg)  ?09/09/21 184 lb (83.5 kg)  ?04/30/21 186 lb (84.4 kg)  ?  ?Other studies Reviewed: ?Additional studies/ records that were reviewed today include:  ? ?Echocardiogram 05/29/20 ?EF 40-45, Gr 1 DD, normal RVSF ?  ?Echocardiogram 02/18/2019 ?EF 30-35, Gr 1 DD, ant-sept and apical AK ?  ?Myoview 02/01/2019 ?EF 26, no ischemia, Lg scar (ant, ant-sept, inf, inf-sept, apical); High Risk ?  ?Echo 09/26/17 ?EF 35-40, ant-sept, ant, apical AK, Gr 1 DD ?  ?Echo 12/23/16 ?Mild conc LVH, EF 35-40, Gr 1 DD, inf-sept, ant-sept, ant, apical sept and ant AK ?  ?Echo 09/23/16 ?EF 35-40, inf-septal HK, apical septal and apical AK, ant and inf AK, ant-septal AK, Gr 1 diastolic dysfunction ?  ?PCI 09/22/16 ?3 x 38 mm Promus DES to mLAD ?Attempted POBA to oD2 (80 >> 80) ?3 x 28 mm Promus DES to RI ?  ?LHC 09/21/16 ?LAD prox 99, ostial D1 80 ?RI prox 70 ?LCx ost 30, mid 38 ?RCA 100 ?EF 45-50 ?PCI: 2.5 x 20 mm Promus DES to pRCA ? ?ASSESSMENT AND PLAN: ? ?CAD: Hx of inferior MI 2018 treated with a DES to the RCA and staged PCI with DES to LAD and DES to the RI. Attempted angioplasty to D2 was unsuccessful. Myoview in 6/20 without ischemia. Pt remains very active with atypical mid axillary  muscular pain, responsive to Tylenol. Continue current regimen with ASA, statin, BB ?  ?Ischemic cardiomyopathy: LVEF at 40-45% on most recent echocardiogram. Appears euvolemic on exam today. Continue Entresto 97-103. Prev

## 2021-11-10 ENCOUNTER — Ambulatory Visit: Payer: 59 | Admitting: Physician Assistant

## 2021-11-10 LAB — CBC
Hematocrit: 42.3 % (ref 37.5–51.0)
Hemoglobin: 14.3 g/dL (ref 13.0–17.7)
MCH: 28 pg (ref 26.6–33.0)
MCHC: 33.8 g/dL (ref 31.5–35.7)
MCV: 83 fL (ref 79–97)
Platelets: 184 10*3/uL (ref 150–450)
RBC: 5.11 x10E6/uL (ref 4.14–5.80)
RDW: 13.1 % (ref 11.6–15.4)
WBC: 5 10*3/uL (ref 3.4–10.8)

## 2021-11-10 LAB — BASIC METABOLIC PANEL
BUN/Creatinine Ratio: 10 (ref 9–20)
BUN: 10 mg/dL (ref 6–24)
CO2: 26 mmol/L (ref 20–29)
Calcium: 9.5 mg/dL (ref 8.7–10.2)
Chloride: 103 mmol/L (ref 96–106)
Creatinine, Ser: 1 mg/dL (ref 0.76–1.27)
Glucose: 108 mg/dL — ABNORMAL HIGH (ref 70–99)
Potassium: 4.4 mmol/L (ref 3.5–5.2)
Sodium: 140 mmol/L (ref 134–144)
eGFR: 87 mL/min/{1.73_m2} (ref >59)

## 2021-11-17 ENCOUNTER — Telehealth: Payer: Self-pay | Admitting: Physician Assistant

## 2021-11-17 NOTE — Telephone Encounter (Signed)
New Message: ? ? ?Patient would like his lab results from last wek please. ?

## 2021-11-17 NOTE — Telephone Encounter (Signed)
The patient has been notified of the result and verbalized understanding.  All questions (if any) were answered. ?Antonieta Iba, RN 11/17/2021 3:57 PM   ?

## 2021-11-18 ENCOUNTER — Ambulatory Visit: Payer: Self-pay | Admitting: General Surgery

## 2021-11-18 ENCOUNTER — Telehealth: Payer: Self-pay | Admitting: *Deleted

## 2021-11-18 NOTE — Telephone Encounter (Signed)
? ?  Pre-operative Risk Assessment  ?  ?Patient Name: Randy Moore  ?DOB: 02/07/1966 ?MRN: [id number]  ? ?  ? ?Request for Surgical ClearanceLAP    ? ?Procedure:   LAP INGUINAL HERNIA SURGERY REPAIR WITH MESH ? ?Date of Surgery:  Clearance TBD                              ?   ?Surgeon:  DR Carlyle Basques ?Surgeon's Group or Practice Name:  [facility] ?Phone number:  [phone] ?Fax number:  [phone] ?  ?Type of Clearance Requested:   ?- Medical  ?- Pharmacy:  Hold Aspirin   ?  ?Type of Anesthesia:  General  ?{ ?Signed, ?Devra Dopp   ?11/18/2021, 3:32 PM   ?

## 2021-11-18 NOTE — H&P (Signed)
Chief Complaint: Inguinal Hernia       History of Present Illness: Randy Moore is a 56 y.o. male who is seen today as an office consultation at the request of Dr. Pace for evaluation of Inguinal Hernia .   Patient is a 56-year-old male who comes in today secondary to a right inguinal hernia.  Patient states this been there for approximate 14 years.  He states is gotten larger over time.  He states that he does have some discomfort of the right inguinal area.  He states he had no signs or symptoms of incarceration or strangulation.   He has had no previous abdominal surgery.   He did have brachytherapy for prostate cancer.       Review of Systems: A complete review of systems was obtained from the patient.  I have reviewed this information and discussed as appropriate with the patient.  See HPI as well for other ROS.   Review of Systems  Constitutional: Negative for fever.  HENT: Negative for congestion.   Eyes: Negative for blurred vision.  Respiratory: Negative for cough, shortness of breath and wheezing.   Cardiovascular: Negative for chest pain and palpitations.  Gastrointestinal: Negative for heartburn.  Genitourinary: Negative for dysuria.  Musculoskeletal: Negative for myalgias.  Skin: Negative for rash.  Neurological: Negative for dizziness and headaches.  Psychiatric/Behavioral: Negative for depression and suicidal ideas.  All other systems reviewed and are negative.       Medical History: Past Medical History History reviewed. No pertinent past medical history.     There is no problem list on file for this patient.     Past Surgical History History reviewed. No pertinent surgical history.     Allergies No Known Allergies     Current Outpatient Medications on File Prior to Visit Medication       Sig       Dispense         Refill            atorvastatin (LIPITOR) 80 MG tablet  daily                               carvediloL (COREG) 6.25 MG tablet   carvedilol 6.25 mg tablet  Take 6.25 mg twice a day by oral route for 90 days.                                      famotidine (PEPCID) 20 MG tablet    famotidine 20 mg tablet                                    levothyroxine (SYNTHROID) 50 MCG tablet levothyroxine 50 mcg tablet                              meloxicam (MOBIC) 7.5 MG tablet    TAKE 1 TABLET(7.5 MG) BY MOUTH DAILY                           sacubitriL-valsartan (ENTRESTO) 97-103 mg tablet Entresto 97 mg-103 mg tablet  TAKE 1 TABLET BY MOUTH TWICE DAILY                                       tamsulosin (FLOMAX) 0.4 mg capsule          daily                               traMADoL (ULTRAM) 50 mg tablet    Take 50 mg by mouth every 6 (six) hours as needed                                     aspirin 81 MG EC tablet         Take 81 mg by mouth once daily                                 nitroGLYcerin (NITROSTAT) 0.4 MG SL tablet         Place 0.4 mg under the tongue every 5 (five) minutes as needed for Chest pain May take up to 3 doses.                   No current facility-administered medications on file prior to visit.     Family History Family History Problem           Relation           Age of Onset            Coronary Artery Disease (Blocked arteries around heart)     Father          Social History   Tobacco Use Smoking Status           Never Smokeless Tobacco   Never     Social History Social History     Socioeconomic History            Marital status:  Unknown Tobacco Use            Smoking status:          Never            Smokeless tobacco:    Never Substance and Sexual Activity            Alcohol use:    Never            Drug use:        Never       Objective:     Vitals:              11/18/21 1100 BP:      128/84 Pulse:  63 Temp:  36.2 C (97.2 F) SpO2:  99% Weight:            84.8 kg (187 lb) Height: 172.7 cm (5' 8")   Body mass index is 28.43 kg/m. Physical Exam Constitutional:       Appearance: Normal appearance.  HENT:     Head: Normocephalic and atraumatic.     Nose: Nose normal. No congestion.     Mouth/Throat:     Mouth: Mucous membranes are moist.     Pharynx: Oropharynx is clear.  Eyes:     Pupils: Pupils are equal, round, and reactive to light.  Cardiovascular:     Rate and Rhythm: Normal rate and regular rhythm.     Pulses: Normal pulses.     Heart sounds: Normal heart sounds. No murmur heard.   No friction rub. No gallop.  

## 2021-11-18 NOTE — Telephone Encounter (Signed)
Patient Name: Randy Moore  ?DOB: 1966/07/07 ?MRN: [id number]  ?  ?  ?  ?Request for Surgical ClearanceLAP    ?  ?Procedure:   LAP INGUINAL HERNIA SURGERY REPAIR WITH MESH ?  ?Date of Surgery:  Clearance TBD                              ?   ?Surgeon:  DR Carlyle Basques ?Surgeon's Group or Practice Name:  [facility] ?Phone number:  [phone] ?Fax number:  [phone] ?  ?Type of Clearance Requested:   ?- Medical  ?- Pharmacy:  Hold Aspirin   ?  ?Type of Anesthesia:  General  ?{ ?Signed, ?Devra Dopp   ?11/18/2021, 3:32 PM   ?

## 2021-11-18 NOTE — Telephone Encounter (Signed)
Preoperative cardiac clearance form is blank/not showing up in encounter.  Please complete the encounter/refresh form.  Thank you. ? ?Jossie Ng. Niko Jakel NP-C ? ?  ?11/18/2021, 3:48 PM ?[place] ?[address] ?Office [phone] Fax [phone] ? ?

## 2021-11-18 NOTE — Telephone Encounter (Addendum)
? ?  Primary Cardiologist: Sherren Mocha, MD ? ?Chart reviewed as part of pre-operative protocol coverage. Given past medical history and time since last visit, based on ACC/AHA guidelines, Randy Moore would be at acceptable risk for the planned procedure without further cardiovascular testing.  He was seen in follow-up by cardiology on 11/09/2021.  He remained stable from a cardiac standpoint at that time.   ? ?His RCRI is a class III risk, 6.6% risk of major cardiac event. ? ?His aspirin may be held for 5-7 days prior to his surgery.  Please resume as soon as hemostasis is achieved. ? ?I will route this recommendation to the requesting party via Epic fax function and remove from pre-op pool. ? ?Please call with questions. ? ?Jossie Ng. Tomoya Ringwald NP-C ? ?  ?11/18/2021, 4:08 PM ?[place] ?[address] ?Office [phone] Fax [phone] ? ? ? ? ?

## 2022-02-08 ENCOUNTER — Ambulatory Visit: Payer: Self-pay | Admitting: General Surgery

## 2022-03-07 ENCOUNTER — Other Ambulatory Visit: Payer: Self-pay | Admitting: Physician Assistant

## 2022-04-04 NOTE — Pre-Procedure Instructions (Signed)
Surgical Instructions    Your procedure is scheduled on April 12, 2022.  Report to [facility] Main Entrance "A" at 5:30 A.M., then check in with the Admitting office.  Call this number if you have problems the morning of surgery:  [phone]   If you have any questions prior to your surgery date call [phone]: Open Monday-Friday 8am-4pm    Remember:  Do not eat after midnight the night before your surgery  You may drink clear liquids until 4:30 AM the morning of your surgery.   Clear liquids allowed are: Water, Non-Citrus Juices (without pulp), Carbonated Beverages, Clear Tea, Black Coffee Only (NO MILK, CREAM OR POWDERED CREAMER of any kind), and Gatorade.  Patient Instructions  The night before surgery:  No food after midnight. ONLY clear liquids after midnight  The day of surgery (if you do NOT have diabetes):  Drink ONE (1) Pre-Surgery Clear Ensure by 4:30 AM the morning of surgery. Drink in one sitting. Do not sip.  This drink was given to you during your hospital  pre-op appointment visit.  Nothing else to drink after completing the  Pre-Surgery Clear Ensure.         If you have questions, please contact your surgeon's office.     Take these medicines the morning of surgery with A SIP OF WATER:  carvedilol (COREG)   famotidine (PEPCID)  levothyroxine (SYNTHROID)   nitroGLYCERIN (NITROSTAT) - take as needed.    Follow your surgeon's instructions on when to stop Aspirin.  If no instructions were given by your surgeon then you will need to call the office to get those instructions.     As of today, STOP taking any Aleve, Naproxen, Ibuprofen, Motrin, Advil, Goody's, BC's, all herbal medications, fish oil, and all vitamins. This includes your medication: meloxicam (MOBIC).                     Do NOT Smoke (Tobacco/Vaping) for 24 hours prior to your procedure.  If you use a CPAP at night, you may bring your mask/headgear for your overnight stay.   Contacts,  glasses, piercing's, hearing aid's, dentures or partials may not be worn into surgery, please bring cases for these belongings.    For patients admitted to the hospital, discharge time will be determined by your treatment team.   Patients discharged the day of surgery will not be allowed to drive home, and someone needs to stay with them for 24 hours.  SURGICAL WAITING ROOM VISITATION Patients having surgery or a procedure may have no more than 2 support people in the waiting area - these visitors may rotate.   Children under the age of 52 must have an adult with them who is not the patient. If the patient needs to stay at the hospital during part of their recovery, the visitor guidelines for inpatient rooms apply. Pre-op nurse will coordinate an appropriate time for 1 support person to accompany patient in pre-op.  This support person may not rotate.   Please refer to the [facility] website for the visitor guidelines for Inpatients (after your surgery is over and you are in a regular room).    Special instructions:   [place]- Preparing For Surgery  Before surgery, you can play an important role. Because skin is not sterile, your skin needs to be as free of germs as possible. You can reduce the number of germs on your skin by washing with CHG (chlorahexidine gluconate) Soap before surgery.  CHG  is an antiseptic cleaner which kills germs and bonds with the skin to continue killing germs even after washing.    Oral Hygiene is also important to reduce your risk of infection.  Remember - BRUSH YOUR TEETH THE MORNING OF SURGERY WITH YOUR REGULAR TOOTHPASTE  Please do not use if you have an allergy to CHG or antibacterial soaps. If your skin becomes reddened/irritated stop using the CHG.  Do not shave (including legs and underarms) for at least 48 hours prior to first CHG shower. It is OK to shave your face.  Please follow these instructions carefully.   Shower the NIGHT BEFORE SURGERY and  the MORNING OF SURGERY  If you chose to wash your hair, wash your hair first as usual with your normal shampoo.  After you shampoo, rinse your hair and body thoroughly to remove the shampoo.  Use CHG Soap as you would any other liquid soap. You can apply CHG directly to the skin and wash gently with a scrungie or a clean washcloth.   Apply the CHG Soap to your body ONLY FROM THE NECK DOWN.  Do not use on open wounds or open sores. Avoid contact with your eyes, ears, mouth and genitals (private parts). Wash Face and genitals (private parts)  with your normal soap.   Wash thoroughly, paying special attention to the area where your surgery will be performed.  Thoroughly rinse your body with warm water from the neck down.  DO NOT shower/wash with your normal soap after using and rinsing off the CHG Soap.  Pat yourself dry with a CLEAN TOWEL.  Wear CLEAN PAJAMAS to bed the night before surgery  Place CLEAN SHEETS on your bed the night before your surgery  DO NOT SLEEP WITH PETS.   Day of Surgery: Take a shower with CHG soap. Do not wear jewelry or makeup Do not wear lotions, powders, perfumes/colognes, or deodorant. Do not shave 48 hours prior to surgery.  Men may shave face and neck. Do not bring valuables to the hospital.  [facility] is not responsible for any belongings or valuables. Do not wear nail polish, gel polish, artificial nails, or any other type of covering on natural nails (fingers and toes) If you have artificial nails or gel coating that need to be removed by a nail salon, please have this removed prior to surgery. Artificial nails or gel coating may interfere with anesthesia's ability to adequately monitor your vital signs.  Wear Clean/Comfortable clothing the morning of surgery Remember to brush your teeth WITH YOUR REGULAR TOOTHPASTE.   Please read over the following fact sheets that you were given.    If you received a COVID test during your pre-op visit  it  is requested that you wear a mask when out in public, stay away from anyone that may not be feeling well and notify your surgeon if you develop symptoms. If you have been in contact with anyone that has tested positive in the last 10 days please notify you surgeon.

## 2022-04-05 ENCOUNTER — Encounter (HOSPITAL_COMMUNITY): Payer: Self-pay

## 2022-04-05 ENCOUNTER — Encounter (HOSPITAL_COMMUNITY)
Admission: RE | Admit: 2022-04-05 | Discharge: 2022-04-05 | Disposition: A | Discharge status: Home / Self Care | Payer: 59 | Source: Ambulatory Visit | Attending: General Surgery | Admitting: General Surgery

## 2022-04-05 ENCOUNTER — Other Ambulatory Visit: Payer: Self-pay

## 2022-04-05 VITALS — BP 134/82 | HR 72 | Temp 97.7°F | Resp 18 | Ht 68.0 in | Wt 195.0 lb

## 2022-04-05 DIAGNOSIS — Z01818 Encounter for other preprocedural examination: Secondary | ICD-10-CM | POA: Insufficient documentation

## 2022-04-05 DIAGNOSIS — I252 Old myocardial infarction: Secondary | ICD-10-CM | POA: Insufficient documentation

## 2022-04-05 DIAGNOSIS — Z87898 Personal history of other specified conditions: Secondary | ICD-10-CM | POA: Diagnosis not present

## 2022-04-05 HISTORY — DX: Hypothyroidism, unspecified: E03.9

## 2022-04-05 LAB — BASIC METABOLIC PANEL
Anion gap: 8 (ref 5–15)
BUN: 11 mg/dL (ref 6–20)
CO2: 26 mmol/L (ref 22–32)
Calcium: 9.2 mg/dL (ref 8.9–10.3)
Chloride: 107 mmol/L (ref 98–111)
Creatinine, Ser: 1.03 mg/dL (ref 0.61–1.24)
GFR, Estimated: >60 mL/min (ref >60)
Glucose, Bld: 113 mg/dL — ABNORMAL HIGH (ref 70–99)
Potassium: 3.7 mmol/L (ref 3.5–5.1)
Sodium: 141 mmol/L (ref 135–145)

## 2022-04-05 LAB — CBC
HCT: 41.4 % (ref 39.0–52.0)
Hemoglobin: 14.1 g/dL (ref 13.0–17.0)
MCH: 28.5 pg (ref 26.0–34.0)
MCHC: 34.1 g/dL (ref 30.0–36.0)
MCV: 83.8 fL (ref 80.0–100.0)
Platelets: 172 10*3/uL (ref 150–400)
RBC: 4.94 MIL/uL (ref 4.22–5.81)
RDW: 12.9 % (ref 11.5–15.5)
WBC: 5.8 10*3/uL (ref 4.0–10.5)
nRBC: 0 % (ref 0.0–0.2)

## 2022-04-05 NOTE — Progress Notes (Signed)
PCP - Shanon Rosser, PA-C Cardiologist - Dr. Sherren Mocha  PPM/ICD - denies   Chest x-ray - 11/24/20 EKG - 04/05/22 Stress Test - 02/01/19 ECHO - 05/29/20 Cardiac Cath - 09/22/16  Sleep Study - denies   DM- denies  Blood Thinner Instructions: n/a Aspirin Instructions: Pt instructed to hold 5-7 days prior to surgery  ERAS Protcol - yes PRE-SURGERY Ensure given at PAT  COVID TEST-  n/a   Anesthesia review: no  Patient denies shortness of breath, fever, cough and chest pain at PAT appointment   All instructions explained to the patient, with a verbal understanding of the material. Patient agrees to go over the instructions while at home for a better understanding.  The opportunity to ask questions was provided.

## 2022-04-11 NOTE — Anesthesia Preprocedure Evaluation (Signed)
Anesthesia Evaluation  Patient identified by MRN, date of birth, ID band Patient awake    Reviewed: Allergy & Precautions, NPO status , Patient's Chart, lab work & pertinent test results, reviewed documented beta blocker date and time   History of Anesthesia Complications Negative for: history of anesthetic complications  Airway Mallampati: III  TM Distance: >3 FB Neck ROM: Full    Dental  (+) Dental Advisory Given, Partial Upper, Partial Lower   Pulmonary neg pulmonary ROS,    Pulmonary exam normal        Cardiovascular hypertension, Pt. on medications and Pt. on home beta blockers + CAD, + Past MI, + Cardiac Stents and +CHF  Normal cardiovascular exam   '21 TTE - EF 40 to 45%. Grade I diastolic dysfunction (impaired relaxation). No valvulopathy.     Neuro/Psych negative neurological ROS  negative psych ROS   GI/Hepatic negative GI ROS, Neg liver ROS,   Endo/Other  Hypothyroidism   Renal/GU negative Renal ROS    Prostate cancer     Musculoskeletal negative musculoskeletal ROS (+)   Abdominal   Peds  Hematology negative hematology ROS (+)   Anesthesia Other Findings Hx noncompliance   Reproductive/Obstetrics                            Anesthesia Physical Anesthesia Plan  ASA: 3  Anesthesia Plan: General   Post-op Pain Management: Tylenol PO (pre-op)* and Celebrex PO (pre-op)*   Induction: Intravenous  PONV Risk Score and Plan: 2 and Treatment may vary due to age or medical condition, Ondansetron, Dexamethasone and Midazolam  Airway Management Planned: Oral ETT  Additional Equipment: None  Intra-op Plan:   Post-operative Plan: Extubation in OR  Informed Consent: I have reviewed the patients History and Physical, chart, labs and discussed the procedure including the risks, benefits and alternatives for the proposed anesthesia with the patient or authorized representative  who has indicated his/her understanding and acceptance.     Dental advisory given  Plan Discussed with: CRNA and Anesthesiologist  Anesthesia Plan Comments:        Anesthesia Quick Evaluation

## 2022-04-12 ENCOUNTER — Ambulatory Visit (HOSPITAL_COMMUNITY)
Admission: RE | Admit: 2022-04-12 | Discharge: 2022-04-12 | Disposition: A | Discharge status: Home / Self Care | Payer: 59 | Source: Ambulatory Visit | Attending: General Surgery | Admitting: General Surgery

## 2022-04-12 ENCOUNTER — Ambulatory Visit (HOSPITAL_BASED_OUTPATIENT_CLINIC_OR_DEPARTMENT_OTHER): Payer: 59 | Admitting: Certified Registered"

## 2022-04-12 ENCOUNTER — Other Ambulatory Visit: Payer: Self-pay

## 2022-04-12 ENCOUNTER — Ambulatory Visit (HOSPITAL_COMMUNITY): Payer: 59 | Admitting: Certified Registered"

## 2022-04-12 ENCOUNTER — Encounter (HOSPITAL_COMMUNITY)
Admission: RE | Disposition: A | Discharge status: Home / Self Care | Payer: Self-pay | Source: Ambulatory Visit | Attending: General Surgery

## 2022-04-12 ENCOUNTER — Encounter (HOSPITAL_COMMUNITY): Payer: Self-pay | Admitting: General Surgery

## 2022-04-12 DIAGNOSIS — I509 Heart failure, unspecified: Secondary | ICD-10-CM

## 2022-04-12 DIAGNOSIS — Z8546 Personal history of malignant neoplasm of prostate: Secondary | ICD-10-CM | POA: Insufficient documentation

## 2022-04-12 DIAGNOSIS — Z955 Presence of coronary angioplasty implant and graft: Secondary | ICD-10-CM | POA: Insufficient documentation

## 2022-04-12 DIAGNOSIS — I110 Hypertensive heart disease with heart failure: Secondary | ICD-10-CM | POA: Diagnosis not present

## 2022-04-12 DIAGNOSIS — I2510 Atherosclerotic heart disease of native coronary artery without angina pectoris: Secondary | ICD-10-CM | POA: Insufficient documentation

## 2022-04-12 DIAGNOSIS — E039 Hypothyroidism, unspecified: Secondary | ICD-10-CM | POA: Diagnosis not present

## 2022-04-12 DIAGNOSIS — I252 Old myocardial infarction: Secondary | ICD-10-CM | POA: Insufficient documentation

## 2022-04-12 DIAGNOSIS — K4090 Unilateral inguinal hernia, without obstruction or gangrene, not specified as recurrent: Secondary | ICD-10-CM | POA: Diagnosis present

## 2022-04-12 DIAGNOSIS — K4030 Unilateral inguinal hernia, with obstruction, without gangrene, not specified as recurrent: Secondary | ICD-10-CM

## 2022-04-12 HISTORY — PX: XI ROBOTIC ASSISTED INGUINAL HERNIA REPAIR WITH MESH: SHX6706

## 2022-04-12 HISTORY — PX: INSERTION OF MESH: SHX5868

## 2022-04-12 SURGERY — REPAIR, HERNIA, INGUINAL, ROBOT-ASSISTED, LAPAROSCOPIC, USING MESH
Anesthesia: General | Site: Groin | Laterality: Right

## 2022-04-12 MED ORDER — PROPOFOL 10 MG/ML IV BOLUS
INTRAVENOUS | Status: DC | PRN
  Administered 2022-04-12: 180 mg via INTRAVENOUS

## 2022-04-12 MED ORDER — LIDOCAINE 2% (20 MG/ML) 5 ML SYRINGE
INTRAMUSCULAR | Status: DC | PRN
  Administered 2022-04-12: 60 mg via INTRAVENOUS

## 2022-04-12 MED ORDER — PROPOFOL 10 MG/ML IV BOLUS
INTRAVENOUS | Status: AC
  Filled 2022-04-12: qty 20

## 2022-04-12 MED ORDER — CELECOXIB 200 MG PO CAPS
200.0000 mg | ORAL_CAPSULE | Freq: Once | ORAL | Status: AC
Start: 2022-04-12 — End: 2022-04-12
  Administered 2022-04-12: 200 mg via ORAL
  Filled 2022-04-12: qty 1

## 2022-04-12 MED ORDER — MIDAZOLAM HCL 2 MG/2ML IJ SOLN
INTRAMUSCULAR | Status: DC | PRN
  Administered 2022-04-12: 2 mg via INTRAVENOUS

## 2022-04-12 MED ORDER — SUCCINYLCHOLINE CHLORIDE 200 MG/10ML IV SOSY
PREFILLED_SYRINGE | INTRAVENOUS | Status: AC
  Filled 2022-04-12: qty 10

## 2022-04-12 MED ORDER — EPHEDRINE SULFATE-NACL 50-0.9 MG/10ML-% IV SOSY
PREFILLED_SYRINGE | INTRAVENOUS | Status: DC | PRN
  Administered 2022-04-12 (×2): 5 mg via INTRAVENOUS

## 2022-04-12 MED ORDER — MIDAZOLAM HCL 2 MG/2ML IJ SOLN
INTRAMUSCULAR | Status: AC
  Filled 2022-04-12: qty 2

## 2022-04-12 MED ORDER — CEFAZOLIN SODIUM-DEXTROSE 2-4 GM/100ML-% IV SOLN
2.0000 g | INTRAVENOUS | Status: AC
  Administered 2022-04-12: 2 g via INTRAVENOUS
  Filled 2022-04-12: qty 100

## 2022-04-12 MED ORDER — BUPIVACAINE HCL (PF) 0.25 % IJ SOLN
INTRAMUSCULAR | Status: AC
  Filled 2022-04-12: qty 30

## 2022-04-12 MED ORDER — OXYCODONE HCL 5 MG/5ML PO SOLN: 5.0000 mg | Freq: Once | ORAL | Status: DC | PRN

## 2022-04-12 MED ORDER — ACETAMINOPHEN 500 MG PO TABS
1000.0000 mg | ORAL_TABLET | ORAL | Status: AC
  Administered 2022-04-12: 1000 mg via ORAL
  Filled 2022-04-12: qty 2

## 2022-04-12 MED ORDER — SUGAMMADEX SODIUM 200 MG/2ML IV SOLN
INTRAVENOUS | Status: DC | PRN
  Administered 2022-04-12: 200 mg via INTRAVENOUS

## 2022-04-12 MED ORDER — FENTANYL CITRATE (PF) 100 MCG/2ML IJ SOLN: 25.0000 ug | INTRAMUSCULAR | Status: DC | PRN

## 2022-04-12 MED ORDER — ROCURONIUM BROMIDE 10 MG/ML (PF) SYRINGE
PREFILLED_SYRINGE | INTRAVENOUS | Status: DC | PRN
  Administered 2022-04-12: 50 mg via INTRAVENOUS
  Administered 2022-04-12: 10 mg via INTRAVENOUS

## 2022-04-12 MED ORDER — ONDANSETRON HCL 4 MG/2ML IJ SOLN
INTRAMUSCULAR | Status: DC | PRN
  Administered 2022-04-12: 4 mg via INTRAVENOUS

## 2022-04-12 MED ORDER — FENTANYL CITRATE (PF) 100 MCG/2ML IJ SOLN
INTRAMUSCULAR | Status: DC | PRN
  Administered 2022-04-12: 50 ug via INTRAVENOUS
  Administered 2022-04-12: 100 ug via INTRAVENOUS

## 2022-04-12 MED ORDER — PHENYLEPHRINE 80 MCG/ML (10ML) SYRINGE FOR IV PUSH (FOR BLOOD PRESSURE SUPPORT)
PREFILLED_SYRINGE | INTRAVENOUS | Status: AC
  Filled 2022-04-12: qty 10

## 2022-04-12 MED ORDER — GLYCOPYRROLATE PF 0.2 MG/ML IJ SOSY
PREFILLED_SYRINGE | INTRAMUSCULAR | Status: DC | PRN
  Administered 2022-04-12: .2 mg via INTRAVENOUS

## 2022-04-12 MED ORDER — FENTANYL CITRATE (PF) 250 MCG/5ML IJ SOLN
INTRAMUSCULAR | Status: AC
  Filled 2022-04-12: qty 5

## 2022-04-12 MED ORDER — 0.9 % SODIUM CHLORIDE (POUR BTL) OPTIME
TOPICAL | Status: DC | PRN
  Administered 2022-04-12: 1000 mL

## 2022-04-12 MED ORDER — ENSURE PRE-SURGERY PO LIQD: 296.0000 mL | Freq: Once | ORAL | Status: DC

## 2022-04-12 MED ORDER — OXYCODONE HCL 5 MG PO TABS: 5.0000 mg | ORAL_TABLET | Freq: Once | ORAL | Status: DC | PRN

## 2022-04-12 MED ORDER — LACTATED RINGERS IV SOLN: INTRAVENOUS | Status: DC

## 2022-04-12 MED ORDER — SODIUM CHLORIDE (PF) 0.9 % IJ SOLN
INTRAMUSCULAR | Status: DC | PRN
  Administered 2022-04-12: 40 mL

## 2022-04-12 MED ORDER — SUCCINYLCHOLINE CHLORIDE 200 MG/10ML IV SOSY
PREFILLED_SYRINGE | INTRAVENOUS | Status: DC | PRN
  Administered 2022-04-12: 120 mg via INTRAVENOUS

## 2022-04-12 MED ORDER — CHLORHEXIDINE GLUCONATE CLOTH 2 % EX PADS: 6.0000 | MEDICATED_PAD | Freq: Once | CUTANEOUS | Status: DC

## 2022-04-12 MED ORDER — PHENYLEPHRINE 80 MCG/ML (10ML) SYRINGE FOR IV PUSH (FOR BLOOD PRESSURE SUPPORT)
PREFILLED_SYRINGE | INTRAVENOUS | Status: DC | PRN
  Administered 2022-04-12: 80 ug via INTRAVENOUS
  Administered 2022-04-12: 160 ug via INTRAVENOUS

## 2022-04-12 MED ORDER — BUPIVACAINE LIPOSOME 1.3 % IJ SUSP
INTRAMUSCULAR | Status: AC
  Filled 2022-04-12: qty 20

## 2022-04-12 MED ORDER — ONDANSETRON HCL 4 MG/2ML IJ SOLN: 4.0000 mg | Freq: Once | INTRAMUSCULAR | Status: DC | PRN

## 2022-04-12 MED ORDER — ROCURONIUM BROMIDE 10 MG/ML (PF) SYRINGE
PREFILLED_SYRINGE | INTRAVENOUS | Status: AC
  Filled 2022-04-12: qty 10

## 2022-04-12 MED ORDER — ORAL CARE MOUTH RINSE: 15.0000 mL | Freq: Once | OROMUCOSAL | Status: AC

## 2022-04-12 MED ORDER — EPHEDRINE 5 MG/ML INJ
INTRAVENOUS | Status: AC
  Filled 2022-04-12: qty 5

## 2022-04-12 MED ORDER — CHLORHEXIDINE GLUCONATE 0.12 % MT SOLN
15.0000 mL | Freq: Once | OROMUCOSAL | Status: AC
  Administered 2022-04-12: 15 mL via OROMUCOSAL
  Filled 2022-04-12: qty 15

## 2022-04-12 MED ORDER — BUPIVACAINE HCL 0.25 % IJ SOLN
INTRAMUSCULAR | Status: DC | PRN
  Administered 2022-04-12: 8 mL

## 2022-04-12 MED ORDER — ACETAMINOPHEN 500 MG PO TABS: 1000.0000 mg | ORAL_TABLET | Freq: Once | ORAL | Status: DC

## 2022-04-12 MED ORDER — LIDOCAINE 2% (20 MG/ML) 5 ML SYRINGE
INTRAMUSCULAR | Status: AC
Start: 2022-04-12 — End: ?
  Filled 2022-04-12: qty 5

## 2022-04-12 MED ORDER — DEXAMETHASONE SODIUM PHOSPHATE 10 MG/ML IJ SOLN
INTRAMUSCULAR | Status: DC | PRN
  Administered 2022-04-12: 10 mg via INTRAVENOUS

## 2022-04-12 MED ORDER — TRAMADOL HCL 50 MG PO TABS: 50.0000 mg | ORAL_TABLET | Freq: Four times a day (QID) | ORAL | 0 refills | Status: AC | PRN

## 2022-04-12 SURGICAL SUPPLY — 45 items
BAG COUNTER SPONGE SURGICOUNT (BAG) IMPLANT
CHLORAPREP W/TINT 26 (MISCELLANEOUS) ×2 IMPLANT
COVER MAYO STAND STRL (DRAPES) ×2 IMPLANT
COVER SURGICAL LIGHT HANDLE (MISCELLANEOUS) ×2 IMPLANT
COVER TIP SHEARS 8 DVNC (MISCELLANEOUS) ×2 IMPLANT
COVER TIP SHEARS 8MM DA VINCI (MISCELLANEOUS) ×2
DEFOGGER SCOPE WARMER CLEARIFY (MISCELLANEOUS) ×2 IMPLANT
DERMABOND ADVANCED (GAUZE/BANDAGES/DRESSINGS) ×2
DERMABOND ADVANCED .7 DNX12 (GAUZE/BANDAGES/DRESSINGS) ×2 IMPLANT
DEVICE TROCAR PUNCTURE CLOSURE (ENDOMECHANICALS) ×2 IMPLANT
DRAPE ARM DVNC X/XI (DISPOSABLE) ×8 IMPLANT
DRAPE COLUMN DVNC XI (DISPOSABLE) ×2 IMPLANT
DRAPE CV SPLIT W-CLR ANES SCRN (DRAPES) ×2 IMPLANT
DRAPE DA VINCI XI ARM (DISPOSABLE) ×8
DRAPE DA VINCI XI COLUMN (DISPOSABLE) ×2
DRAPE ORTHO SPLIT 77X108 STRL (DRAPES) ×2
DRAPE SURG ORHT 6 SPLT 77X108 (DRAPES) ×2 IMPLANT
ELECT REM PT RETURN 9FT ADLT (ELECTROSURGICAL) ×2
ELECTRODE REM PT RTRN 9FT ADLT (ELECTROSURGICAL) ×2 IMPLANT
GLOVE BIO SURGEON STRL SZ7.5 (GLOVE) ×4 IMPLANT
GOWN STRL REUS W/ TWL LRG LVL3 (GOWN DISPOSABLE) ×4 IMPLANT
GOWN STRL REUS W/ TWL XL LVL3 (GOWN DISPOSABLE) ×4 IMPLANT
GOWN STRL REUS W/TWL 2XL LVL3 (GOWN DISPOSABLE) ×2 IMPLANT
GOWN STRL REUS W/TWL LRG LVL3 (GOWN DISPOSABLE) ×4
GOWN STRL REUS W/TWL XL LVL3 (GOWN DISPOSABLE) ×4
KIT BASIN OR (CUSTOM PROCEDURE TRAY) ×2 IMPLANT
KIT TURNOVER KIT B (KITS) ×2 IMPLANT
MARKER SKIN DUAL TIP RULER LAB (MISCELLANEOUS) ×2 IMPLANT
MESH PROGRIP LAP SELF FIXATING (Mesh General) ×2 IMPLANT
MESH PROGRIP LAP SLF FIX 16X12 (Mesh General) ×2 IMPLANT
NDL INSUFFLATION 14GA 120MM (NEEDLE) ×2 IMPLANT
NEEDLE HYPO 22GX1.5 SAFETY (NEEDLE) ×2 IMPLANT
NEEDLE INSUFFLATION 14GA 120MM (NEEDLE) ×2 IMPLANT
PAD ARMBOARD 7.5X6 YLW CONV (MISCELLANEOUS) ×4 IMPLANT
SEAL CANN UNIV 5-8 DVNC XI (MISCELLANEOUS) ×4 IMPLANT
SEAL XI 5MM-8MM UNIVERSAL (MISCELLANEOUS) ×6
SET TUBE SMOKE EVAC HIGH FLOW (TUBING) ×2 IMPLANT
STOPCOCK 4 WAY LG BORE MALE ST (IV SETS) ×2 IMPLANT
SUT MNCRL AB 4-0 PS2 18 (SUTURE) ×2 IMPLANT
SUT VIC AB 2-0 SH 27 (SUTURE)
SUT VIC AB 2-0 SH 27X BRD (SUTURE) IMPLANT
SUT VLOC 180 2-0 6IN GS21 (SUTURE) ×2 IMPLANT
SUT VLOC 180 2-0 9IN GS21 (SUTURE) ×2 IMPLANT
TOWEL GREEN STERILE FF (TOWEL DISPOSABLE) ×2 IMPLANT
TRAY LAPAROSCOPIC MC (CUSTOM PROCEDURE TRAY) ×2 IMPLANT

## 2022-04-12 NOTE — Anesthesia Procedure Notes (Signed)
Procedure Name: Intubation Date/Time: 04/12/2022 7:36 AM  Performed by: Moshe Salisbury, CRNAPre-anesthesia Checklist: Patient identified, Emergency Drugs available, Suction available and Patient being monitored Patient Re-evaluated:Patient Re-evaluated prior to induction Oxygen Delivery Method: Circle System Utilized Preoxygenation: Pre-oxygenation with 100% oxygen Induction Type: IV induction Ventilation: Mask ventilation without difficulty Laryngoscope Size: Mac, Glidescope and 4 Tube type: Oral Tube size: 8.0 mm Number of attempts: 2 (Patient coughing after induction begun, suctioned for minimal clear, colorless secretions. Succinylcholine given. Grade III view, E) Airway Equipment and Method: Stylet, Rigid stylet and Video-laryngoscopy Placement Confirmation: ETT inserted through vocal cords under direct vision, positive ETCO2 and breath sounds checked- equal and bilateral Secured at: 22 cm Tube secured with: Tape Dental Injury: Bloody posterior oropharynx  Difficulty Due To: Difficulty was unanticipated, Difficult Airway- due to immobile epiglottis and Difficult Airway- due to anterior larynx Future Recommendations: Recommend- induction with short-acting agent, and alternative techniques readily available Comments: Patient coughing after induction. Suctioned for minimal clear, colorless secretions. Grade III view with Mac 4 and cricoid pressure. ETT placed and immediately recognized as esophageal intubation. Glide scope called for and easy mask ventilation for SpO2 of 85. SpO2 rapidly returned to 100. Larynx extremely anterior even with glidescope. AOI.

## 2022-04-12 NOTE — Transfer of Care (Signed)
Immediate Anesthesia Transfer of Care Note  Patient: Randy Moore  Procedure(s) Performed: XI ROBOTIC ASSISTED RIGHT INGUINAL HERNIA REPAIR WITH MESH (Right: Abdomen) INSERTION OF MESH (Right: Groin)  Patient Location: PACU  Anesthesia Type:General  Level of Consciousness: awake, oriented and drowsy  Airway & Oxygen Therapy: Patient Spontanous Breathing and Patient connected to nasal cannula oxygen  Post-op Assessment: Report given to RN and Post -op Vital signs reviewed and stable  Post vital signs: Reviewed and stable  Last Vitals:  Vitals Value Taken Time  BP 155/93 04/12/22 0919  Temp    Pulse 65 04/12/22 0920  Resp 12 04/12/22 0920  SpO2 98 % 04/12/22 0920  Vitals shown include unvalidated device data.  Last Pain:  Vitals:   04/12/22 0549  PainSc: 0-No pain         Complications:  Encounter Notable Events  Notable Event Outcome Phase Comment  Difficult to intubate - unexpected  Intraprocedure Filed from anesthesia note documentation.

## 2022-04-12 NOTE — Discharge Instructions (Signed)
[facility] _______[facility] ? ?INGUINAL HERNIA REPAIR: POST OP INSTRUCTIONS ? ?Always review your discharge instruction sheet given to you by the facility where your surgery was performed. ?IF YOU HAVE DISABILITY OR FAMILY LEAVE FORMS, YOU MUST BRING THEM TO THE OFFICE FOR PROCESSING.   ?DO NOT GIVE THEM TO YOUR DOCTOR. ? ?1. A  prescription for pain medication may be given to you upon discharge.  Take your pain medication as prescribed, if needed.  If narcotic pain medicine is not needed, then you may take acetaminophen (Tylenol) or ibuprofen (Advil) as needed. ?2. Take your usually prescribed medications unless otherwise directed. ?If you need a refill on your pain medication, please contact your pharmacy.  They will contact our office to request authorization. Prescriptions will not be filled after 5 pm or on week-ends. ?3. You should follow a light diet the first 24 hours after arrival home, such as soup and crackers, etc.  Be sure to include lots of fluids daily.  Resume your normal diet the day after surgery. ?4.Most patients will experience some swelling and bruising around the umbilicus or in the groin and scrotum.  Ice packs and reclining will help.  Swelling and bruising can take several days to resolve.  ?6. It is common to experience some constipation if taking pain medication after surgery.  Increasing fluid intake and taking a stool softener (such as Colace) will usually help or prevent this problem from occurring.  A mild laxative (Milk of Magnesia or Miralax) should be taken according to package directions if there are no bowel movements after 48 hours. ?7. Unless discharge instructions indicate otherwise, you may remove your bandages 24-48 hours after surgery, and you may shower at that time.  You may have steri-strips (small skin tapes) in place directly over the incision.  These strips should be left on the skin for 7-10 days.  If your surgeon used skin glue on the incision, you may  shower in 24 hours.  The glue will flake off over the next 2-3 weeks.  Any sutures or staples will be removed at the office during your follow-up visit. ?8. ACTIVITIES:  You may resume regular (light) daily activities beginning the next day--such as daily self-care, walking, climbing stairs--gradually increasing activities as tolerated.  You may have sexual intercourse when it is comfortable.  Refrain from any heavy lifting or straining until approved by your doctor. ? ?a.You may drive when you are no longer taking prescription pain medication, you can comfortably wear a seatbelt, and you can safely maneuver your car and apply brakes. ?b.RETURN TO WORK:   ?_____________________________________________ ? ?9.You should see your doctor in the office for a follow-up appointment approximately 2-3 weeks after your surgery.  Make sure that you call for this appointment within a day or two after you arrive home to insure a convenient appointment time. ?10.OTHER INSTRUCTIONS: _________________________ ?   _____________________________________ ? ?WHEN TO CALL YOUR DOCTOR: ?Fever over 101.0 ?Inability to urinate ?Nausea and/or vomiting ?Extreme swelling or bruising ?Continued bleeding from incision. ?Increased pain, redness, or drainage from the incision ? ?The clinic staff is available to answer your questions during regular business hours.  Please don?t hesitate to call and ask to speak to one of the nurses for clinical concerns.  If you have a medical emergency, go to the nearest emergency room or call 911.  A surgeon from [facility] is always on call at the hospital ? ? ?[address], [address], [place], [place]    [address] ? ? [address] [address], [place], [place]   [address] ?[phone] ? [phone] ? FAX [phone] ?Web site: [email or web address] ? ?

## 2022-04-12 NOTE — H&P (Signed)
Chief Complaint: Inguinal Hernia       History of Present Illness: Randy Moore is a 56 y.o. male who is seen today as an office consultation at the request of Dr. Claudia Desanctis for evaluation of Inguinal Hernia .   Patient is a 57 year old male who comes in today secondary to a right inguinal hernia.  Patient states this been there for approximate 14 years.  He states is gotten larger over time.  He states that he does have some discomfort of the right inguinal area.  He states he had no signs or symptoms of incarceration or strangulation.   He has had no previous abdominal surgery.   He did have brachytherapy for prostate cancer.       Review of Systems: A complete review of systems was obtained from the patient.  I have reviewed this information and discussed as appropriate with the patient.  See HPI as well for other ROS.   Review of Systems  Constitutional: Negative for fever.  HENT: Negative for congestion.   Eyes: Negative for blurred vision.  Respiratory: Negative for cough, shortness of breath and wheezing.   Cardiovascular: Negative for chest pain and palpitations.  Gastrointestinal: Negative for heartburn.  Genitourinary: Negative for dysuria.  Musculoskeletal: Negative for myalgias.  Skin: Negative for rash.  Neurological: Negative for dizziness and headaches.  Psychiatric/Behavioral: Negative for depression and suicidal ideas.  All other systems reviewed and are negative.       Medical History: Past Medical History History reviewed. No pertinent past medical history.     There is no problem list on file for this patient.     Past Surgical History History reviewed. No pertinent surgical history.     Allergies No Known Allergies     Current Outpatient Medications on File Prior to Visit Medication       Sig       Dispense         Refill            atorvastatin (LIPITOR) 80 MG tablet  daily                               carvediloL (COREG) 6.25 MG tablet   carvedilol 6.25 mg tablet  Take 6.25 mg twice a day by oral route for 90 days.                                      famotidine (PEPCID) 20 MG tablet    famotidine 20 mg tablet                                    levothyroxine (SYNTHROID) 50 MCG tablet levothyroxine 50 mcg tablet                              meloxicam (MOBIC) 7.5 MG tablet    TAKE 1 TABLET(7.5 MG) BY MOUTH DAILY                           sacubitriL-valsartan (ENTRESTO) 97-103 mg tablet Entresto 97 mg-103 mg tablet  TAKE 1 TABLET BY MOUTH TWICE DAILY  tamsulosin (FLOMAX) 0.4 mg capsule          daily                               traMADoL (ULTRAM) 50 mg tablet    Take 50 mg by mouth every 6 (six) hours as needed                                     aspirin 81 MG EC tablet         Take 81 mg by mouth once daily                                 nitroGLYcerin (NITROSTAT) 0.4 MG SL tablet         Place 0.4 mg under the tongue every 5 (five) minutes as needed for Chest pain May take up to 3 doses.                   No current facility-administered medications on file prior to visit.     Family History Family History Problem           Relation           Age of Onset            Coronary Artery Disease (Blocked arteries around heart)     Father          Social History   Tobacco Use Smoking Status           Never Smokeless Tobacco   Never     Social History Social History     Socioeconomic History            Marital status:  Unknown Tobacco Use            Smoking status:          Never            Smokeless tobacco:    Never Substance and Sexual Activity            Alcohol use:    Never            Drug use:        Never       Objective:     Vitals:              11/18/21 1100 BP:      128/84 Pulse:  63 Temp:  36.2 C (97.2 F) SpO2:  99% Weight:            84.8 kg (187 lb) Height: 172.7 cm ('5\' 8"'$ )   Body mass index is 28.43 kg/m. Physical Exam Constitutional:       Appearance: Normal appearance.  HENT:     Head: Normocephalic and atraumatic.     Nose: Nose normal. No congestion.     Mouth/Throat:     Mouth: Mucous membranes are moist.     Pharynx: Oropharynx is clear.  Eyes:     Pupils: Pupils are equal, round, and reactive to light.  Cardiovascular:     Rate and Rhythm: Normal rate and regular rhythm.     Pulses: Normal pulses.     Heart sounds: Normal heart sounds. No murmur heard.   No friction rub. No gallop.  Pulmonary:     Effort: Pulmonary effort is normal. No respiratory distress.     Breath sounds: Normal breath sounds. No stridor. No wheezing, rhonchi or rales.  Abdominal:     General: Abdomen is flat.     Hernia: A hernia is present. Hernia is present in the right inguinal area.  Musculoskeletal:        General: Normal range of motion.     Cervical back: Normal range of motion.  Skin:    General: Skin is warm and dry.  Neurological:     General: No focal deficit present.     Mental Status: He is alert and oriented to person, place, and time.  Psychiatric:        Mood and Affect: Mood normal.        Thought Content: Thought content normal.          Assessment and Plan: Diagnoses and all orders for this visit:   Unilateral inguinal hernia without obstruction or gangrene, recurrence not specified     Randy Moore is a 56 y.o. male    1.          We will proceed to the OR for a robotic right inguinal hernia repair with mesh. 2.         All risks and benefits were discussed with the patient, to generally include infection, bleeding, damage to surrounding structures, acute and chronic nerve pain, and recurrence. Alternatives were offered and described.  All questions were answered and the patient voiced understanding of the procedure and wishes to proceed at this point.             No follow-ups on file.   Ralene Ok, MD, [facility], [place] [facility]

## 2022-04-12 NOTE — Anesthesia Postprocedure Evaluation (Signed)
Anesthesia Post Note  Patient: Jamal Maes  Procedure(s) Performed: XI ROBOTIC ASSISTED RIGHT INGUINAL HERNIA REPAIR WITH MESH (Right: Abdomen) INSERTION OF MESH (Right: Groin)     Patient location during evaluation: PACU Anesthesia Type: General Level of consciousness: awake and alert Pain management: pain level controlled Vital Signs Assessment: post-procedure vital signs reviewed and stable Respiratory status: spontaneous breathing, nonlabored ventilation and respiratory function stable Cardiovascular status: stable and blood pressure returned to baseline Anesthetic complications: yes   Encounter Notable Events  Notable Event Outcome Phase Comment  Difficult to intubate - unexpected  Intraprocedure Filed from anesthesia note documentation.    Last Vitals:  Vitals:   04/12/22 0945 04/12/22 1000  BP: 126/78 (!) 135/93  Pulse: 77 61  Resp: (!) 23 12  Temp:    SpO2: 97% 98%    Last Pain:  Vitals:   04/12/22 0919  PainSc: [place] Jenea Dake

## 2022-04-12 NOTE — Op Note (Signed)
04/12/2022  8:52 AM  PATIENT:  Randy Moore  56 y.o. male  PRE-OPERATIVE DIAGNOSIS:  RIGHT INGUINAL HERNIA  POST-OPERATIVE DIAGNOSIS:  INCARCERATED, RIGHT INDIRECT INGUINAL HERNIA  PROCEDURE:  Procedure(s): XI ROBOTIC ASSISTED RIGHT INGUINAL HERNIA REPAIR WITH MESH (Right) INSERTION OF MESH (Right)  SURGEON:  Surgeon(s) and Role:    Ralene Ok, MD - Primary  ASSISTANTS: Celene Squibb, RNFA   ANESTHESIA:   local and general  EBL:  none   BLOOD ADMINISTERED:none  DRAINS: none   LOCAL MEDICATIONS USED:  BUPIVICAINE  and OTHER exaprel  SPECIMEN:  No Specimen  DISPOSITION OF SPECIMEN:  N/A  COUNTS:  YES  TOURNIQUET:  * No tourniquets in log *  DICTATION: .Dragon Dictation Details of the procedure:   The patient was taken back to the operating room. The patient was placed in supine position with bilateral SCDs in place.  A Foley catheter was placed.  The patient was prepped and draped in the usual sterile fashion.  After appropriate anitbiotics were confirmed, a time-out was confirmed and all facts were verified.  At this time a Veress needle technique was used to inspect the abdomen approximately 10 cm from the valgus and the paramedian line. This time a 8 mm robotic trocar was placed into the abdomen. The camera was placed there was no injury to any intra-abdominal organs. A 30m umbilical port was placed just superior to the umbilicus. An 8 mm port was placed approximately 10 cm lateral to the umbilicus on the left paramedian side.   Robot was positioned over the patient and the ports were docked in the usual fashion.  There was a big portion of the omentum incarcerated in the inguinal canal.  I attempted to reduce this without success.  At this time the right-sided peritoneum was taken down from the medial umbilical ligament laterally. The pre-peritoneal space was entered. Dissection was taken down to Cooper's ligament. At this time it was apparent there was an  indirect hernia. At this time proceeded clean out the rest Cooper's ligament and the medial to lateral direction. A proceeded laterally to dissect the spermatic cord. The henria sac and spermatic cord was circumferentially dissected away from the surrounding musculature and tissue. The vas deferens was identified and protected all portions of the case.  I proceeded to reduce the hernia sac and omentum with external pressure and retraction.The hernia sac was dissected back. At this time I proceeded to create a pocket laterally for the mesh. Once the pocket was created the peritoneum was stripped back to approximately the base of the cord. At this time the piece of Progrip 16x12cm mesh was in placed into the dissected area. This covered both the direct and indirect spaces appropriately. This also covered  The femoral space.  The mesh lay flat from medial to lateral. At this time a 2-0 V- lock stitch was used to close the peritoneum in a standard running fashion.  There was a tear in the peritoneum.  This was closed using a runnin g2-0 v-lock suture.  At this time the robot was undocked. The umbilical port site was reapproximated using a 0 Vicryl via an Endo Close device 1. All ports were removed. The skin was reapproximated and all port sites using 4-0 Monocryl subcuticular fashion.  The patient the procedure well was taken to the recovery     PLAN OF CARE: Discharge to home after PACU  PATIENT DISPOSITION:  PACU - hemodynamically stable.   Delay start of Pharmacological  VTE agent (>24hrs) due to surgical blood loss or risk of bleeding: not applicable

## 2022-04-13 ENCOUNTER — Encounter (HOSPITAL_COMMUNITY): Payer: Self-pay | Admitting: General Surgery

## 2023-02-17 ENCOUNTER — Other Ambulatory Visit: Payer: Self-pay | Admitting: Physician Assistant

## 2023-02-17 ENCOUNTER — Other Ambulatory Visit: Payer: Self-pay | Admitting: Cardiovascular Disease

## 2023-07-06 ENCOUNTER — Ambulatory Visit: Payer: 59 | Attending: Cardiovascular Disease | Admitting: Cardiovascular Disease

## 2023-07-06 ENCOUNTER — Encounter: Payer: Self-pay | Admitting: Cardiovascular Disease

## 2023-07-06 VITALS — BP 138/82 | HR 62 | Ht 68.0 in | Wt 184.6 lb

## 2023-07-06 DIAGNOSIS — Z79899 Other long term (current) drug therapy: Secondary | ICD-10-CM

## 2023-07-06 DIAGNOSIS — I5042 Chronic combined systolic (congestive) and diastolic (congestive) heart failure: Secondary | ICD-10-CM | POA: Diagnosis not present

## 2023-07-06 DIAGNOSIS — I10 Essential (primary) hypertension: Secondary | ICD-10-CM

## 2023-07-06 DIAGNOSIS — E782 Mixed hyperlipidemia: Secondary | ICD-10-CM

## 2023-07-06 DIAGNOSIS — I2510 Atherosclerotic heart disease of native coronary artery without angina pectoris: Secondary | ICD-10-CM

## 2023-07-06 MED ORDER — CARVEDILOL 6.25 MG PO TABS: 6.2500 mg | ORAL_TABLET | Freq: Two times a day (BID) | ORAL | 3 refills | Status: DC

## 2023-07-06 MED ORDER — ENTRESTO 97-103 MG PO TABS: 1.0000 | ORAL_TABLET | Freq: Two times a day (BID) | ORAL | 3 refills | Status: DC

## 2023-07-06 MED ORDER — ATORVASTATIN CALCIUM 80 MG PO TABS: 80.0000 mg | ORAL_TABLET | Freq: Every day | ORAL | 3 refills | Status: DC

## 2023-07-06 NOTE — Patient Instructions (Signed)
Medication Instructions:  RESUME Aspirin 81mg  daily PLEASE TAKE Carvedilol/Coreg and Entresto twice daily  *If you need a refill on your cardiac medications before your next appointment, please call your pharmacy*  Lab Work: CBC, CMET, Lipids, TSH today If you have labs (blood work) drawn today and your tests are completely normal, you will receive your results only by: MyChart Message (if you have MyChart) OR A paper copy in the mail If you have any lab test that is abnormal or we need to change your treatment, we will call you to review the results.  Follow-Up: At [facility], you and your health needs are our priority.  As part of our continuing mission to provide you with exceptional heart care, we have created designated Provider Care Teams.  These Care Teams include your primary Cardiologist (physician) and Advanced Practice Providers (APPs -  Physician Assistants and Nurse Practitioners) who all work together to provide you with the care you need, when you need it.  Your next appointment:   1 year(s)  Provider:   Tonny Bollman, MD

## 2023-07-06 NOTE — Assessment & Plan Note (Signed)
No anginal symptoms.  Advised him to resume aspirin 81 mg daily.  Otherwise continue current medicines.

## 2023-07-06 NOTE — Progress Notes (Signed)
Cardiology Office Note:    Date:  07/06/2023   ID:  Randy Moore, DOB 1966-05-24, MRN [id number]  PCP:  Randy Pascal, PA-C   [facility] Providers Cardiologist:  Randy Bollman, MD Cardiology APP:  Randy Moore     Referring MD: Randy Moore, [place]   Chief Complaint  Patient presents with   Coronary Artery Disease    History of Present Illness:    Randy Moore is a 57 y.o. male with a hx of coronary artery disease and HFrEF presenting for follow-up evaluation.  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  He is doing very well.  He still plays soccer and has no exertional symptoms with that level of activity.  He denies chest pain, chest pressure, shortness of breath, lightheadedness, or heart palpitations.  There is some significant confusion about his medications.  He is only taking carvedilol and Entresto once daily.  He is not taking aspirin.  He is taking atorvastatin per his report.  He is no longer taking levothyroxine.  We reviewed all of this today and we will update his labs.  Current Medications: Current Meds  Medication Sig   aspirin EC 81 MG EC tablet Take 1 tablet (81 mg total) by mouth daily. (Patient taking differently: Take 81 mg by mouth as needed.)   famotidine (PEPCID) 20 MG tablet TAKE 1 TABLET(20 MG) BY MOUTH TWICE DAILY (Patient taking differently: Take 20 mg by mouth as needed. TAKE 1  TABLET(20 MG) BY MOUTH TWICE DAILY)   levothyroxine (SYNTHROID) 50 MCG tablet Take 50 mcg by mouth daily before breakfast.   meloxicam (MOBIC) 7.5 MG tablet TAKE 1 TABLET(7.5 MG) BY MOUTH DAILY   MYRBETRIQ 50 MG TB24 tablet Take 50 mg by mouth daily.   nitroGLYCERIN (NITROSTAT) 0.4 MG SL tablet PLACE 1 TABLET UNDER TONGUE EVERY 5 MINUTES FOR 3 DOSES AS NEEDED FOR CHEST PAIN   solifenacin (VESICARE) 10 MG tablet Take 10 mg by mouth daily.   [DISCONTINUED] atorvastatin (LIPITOR) 80 MG tablet TAKE 1 TABLET(80 MG) BY MOUTH EVERY EVENING   [DISCONTINUED] carvedilol (COREG) 6.25 MG tablet TAKE 1 TABLET(6.25 MG) BY MOUTH IN THE MORNING AND AT BEDTIME   [DISCONTINUED] sacubitril-valsartan (ENTRESTO) 97-103 MG Take 1 tablet by mouth 2 (two) times daily.   [DISCONTINUED] tamsulosin (FLOMAX) 0.4 MG CAPS capsule Take 0.4 mg by mouth at bedtime.     Allergies:   Patient has no known allergies.   ROS:   Please see the history of present illness.    All other systems reviewed and are negative.  EKGs/Labs/Other Studies Reviewed:    The following studies were reviewed today: Cardiac Studies & Procedures   CARDIAC CATHETERIZATION  CARDIAC CATHETERIZATION 09/21/2016  Narrative 1. Acute inferior wall and RV myocardial infarction complicated by bradycardia and hypotension, treated successfully with primary PCI using a drug-eluting stent 2. Severe multivessel coronary artery disease with subtotal occlusion of the LAD, severe stenosis of the  first diagonal, and severe stenosis of the ramus intermedius branch 3. Moderate segmental LV systolic dysfunction with severe elevation of LVEDP  Recommendations: The patient will be loaded with brilinta. Will continue Aggrastat for 6 hours. He may require diuresis considering his elevated LVEDP. Will need to decide on further revascularization. Legrand Rams an attempted staged PCI of the LAD with aggressive medical therapy. If LAD PCI is unsuccessful would consider  CABG.  Findings Coronary Findings Diagnostic  Dominance: Right  Left Anterior Descending There is a complex high-grade stenosis of the proximal LAD involving the origin of the first diagonal. The LAD has 99% stenosis with TIMI 2 flow. There is antegrade flow that reaches the LV apex. The diagonal is a moderate size vessel with 80% stenosis present.  First Diagonal Branch  Ramus Intermedius There is diffuse disease of a large ramus intermedius and the proximal and mid vessel, estimated at 70% stenosis  Left Circumflex  Right Coronary Artery  Intervention  Prox RCA lesion Angioplasty A drug eluting stent was successfully placed. Post-stent angioplasty was performed. There is no pre-interventional antegrade distal flow (TIMI 0).  The post-interventional distal flow is normal (TIMI 3). The intervention was successful . No complications occurred at this lesion. Heparin and Aggrastat are used for anticoagulation. A JR4 guide catheter is used a Administrator, arts is advanced across the occlusion. The lesion is predilated with a 2.5 mm balloon and stented with a 2.5 x 20 mm Promus DES. The stent is postdilated with a 2.75 mm noncompliant balloon to 14 atm. At the completion of the procedure there is TIMI 3 flow in the vessel. There is a 0% residual stenosis post intervention.   CARDIAC CATHETERIZATION  CARDIAC CATHETERIZATION 09/22/2016  Narrative  Mid Cx lesion, 50 %stenosed.  Ost Ramus to Ramus lesion, 60 %stenosed.  A STENT PROMUS PREM MR 3.0X28 drug eluting stent was successfully placed.  Ramus lesion, 80 %stenosed.  Post intervention, there is a 0% residual stenosis.  A STENT PROMUS PREM MR 3.0X38 drug eluting stent was successfully placed.  Mid LAD lesion, 100 %stenosed.  Post intervention, there is a 0% residual stenosis.  Ost 2nd Diag lesion, 80 %stenosed.  Post intervention, there is a 80% residual stenosis.  Ost LAD to Mid LAD lesion, 40 %stenosed.  Ost Cx to Prox Cx  lesion, 30 %stenosed.  1. Chronic total occlusion mid LAD 2. Successful PTCA/DES x 1 mid LAD 3. Severe stenosis ramus intermediate branch 4. Successful PTCA/DES x 1 intermediate branch  Recommendations: Continue ASA and Brilinta for one year. Continue beta blocker and statin.  Findings Coronary Findings Diagnostic  Dominance: Right  Left Anterior Descending  The lesion is chronically occluded.  First Diagonal Branch Vessel is moderate in size.  Second Diagonal Branch Vessel is moderate in size.  Second Septal Branch Vessel is small in size.  Third Septal Branch Vessel is small in size.  Ramus Intermedius Vessel is large.  Left Circumflex  Intervention  Mid LAD lesion Angioplasty Lesion crossed with guidewire. Pre-stent angioplasty was performed using a BALLOON EUPHORA RX2.5X15. A STENT PROMUS PREM MR 3.0X38 drug eluting stent was successfully placed. Stent strut is well apposed. Post-stent angioplasty was performed using a BALLOON [place] EUPHORA [id number]. There is no pre-interventional antegrade distal flow (TIMI 0).  The post-interventional distal flow is normal (TIMI 3). The intervention was successful . No complications occurred at this lesion. There is a 0% residual stenosis post intervention.  Ost 2nd Diag lesion Angioplasty Lesion crossed with guidewire. Angioplasty alone was  performed using a BALLOON EUPHORA RX2.5X15. The pre-interventional distal flow is normal (TIMI 3).  The post-interventional distal flow is decreased (TIMI 2). The intervention was successful . No complications occurred at this lesion. There is a 80% residual stenosis post intervention.  Ost Ramus to Ramus lesion Angioplasty (Also treats lesions: Ramus) Lesion crossed with guidewire. Pre-stent angioplasty was performed using a BALLOON EUPHORA RX2.5X15. A STENT PROMUS PREM MR 3.0X28 drug eluting stent was successfully placed. Stent strut is well apposed. Post-stent angioplasty was performed using  a BALLOON Romeo EUPHORA [id number]. The pre-interventional distal flow is normal (TIMI 3).  The post-interventional distal flow is normal (TIMI 3). The intervention was successful . No complications occurred at this lesion. There is a 0% residual stenosis post intervention.  Ramus lesion Angioplasty (Also treats lesions: Ost Ramus to Ramus) See details in Ost Ramus to Ramus lesion. There is a 0% residual stenosis post intervention.   STRESS TESTS  MYOCARDIAL PERFUSION IMAGING 02/01/2019  Narrative  Nuclear stress EF: 26%.  There was no ST segment deviation noted during stress.  Defect 1: There is a large defect of severe severity present in the basal anterior, basal anteroseptal, basal inferoseptal, mid anterior, mid anteroseptal, mid inferoseptal, mid inferior, apical anterior, apical septal, apical inferior, apical lateral and apex location.  Findings consistent with prior myocardial infarction.  This is a high risk study.  The left ventricular ejection fraction is severely decreased (<30%).  No ischemia noted. All defects have no reversibility.  At the apex, the inferior wall is absent and fixed. The apical cap, anterior, septal and lateral walls are severe and fixed.  At the mid ventricle, the anterior wall is absent and fixed. The inferior wall and septum are severe and fixed. Lateral wall has normal perfusion.  At the base, anteroseptum is severe and fixed. Inferoseptum is moderate and fixed. Inferior wall is mild and fixed. Anterior wall and lateral wall have relative normal perfusion.  LV appears severely dilated, and ejection fraction is severely reduced. Wall motion severely hypokinetic, with akinesis of the apex and mid anterior wall.  High risk study.   ECHOCARDIOGRAM  ECHOCARDIOGRAM COMPLETE 05/29/2020  Narrative ECHOCARDIOGRAM REPORT    Patient Name:   Randy Moore   Date of Exam: 05/29/2020 Medical Rec #:  [id number]     Height:       68.0 in Accession #:     [id number]    Weight:       176.0 lb Date of Birth:  [birth year]     BSA:          1.936 m Patient Age:    54 years      BP:           104/72 mmHg Patient Gender: M             HR:           70 bpm. Exam Location:  [address]  Procedure: 2D Echo, Cardiac Doppler, Color Doppler and Intracardiac Opacification Agent  Indications:    I50.9 Congestive heart failure.  History:        Patient has prior history of Echocardiogram examinations, most recent 02/18/2019. Risk Factors:Hypertension and Dyslipidemia. Cardiogenic shock. Chronic systolic heart failure. STEMI. Ischemic cardiomyopathy. Coronary artery disease.  Sonographer:    Sedonia Small Rodgers-Jones RDCS Referring Phys: [id number] [address] Remo  IMPRESSIONS   1. Left ventricular ejection fraction, by estimation, is 40 to 45%. The left ventricle has mildly decreased function. The left ventricle demonstrates regional wall  motion abnormalities (see scoring diagram/findings for description). Left ventricular diastolic parameters are consistent with Grade I diastolic dysfunction (impaired relaxation). 2. Right ventricular systolic function is normal. The right ventricular size is normal. Tricuspid regurgitation signal is inadequate for assessing PA pressure. 3. The mitral valve is normal in structure. No evidence of mitral valve regurgitation. 4. The aortic valve is tricuspid. Aortic valve regurgitation is not visualized. No aortic stenosis is present. 5. The inferior vena cava is normal in size with greater than 50% respiratory variability, suggesting right atrial pressure of 3 mmHg.  FINDINGS Left Ventricle: Left ventricular ejection fraction, by estimation, is 40 to 45%. The left ventricle has mildly decreased function. The left ventricle demonstrates regional wall motion abnormalities. Definity contrast agent was given IV to delineate the left ventricular endocardial borders. The left ventricular internal cavity size was normal in size. There  is no left ventricular hypertrophy. Left ventricular diastolic parameters are consistent with Grade I diastolic dysfunction (impaired relaxation).   LV Wall Scoring: The mid and distal anterior septum, apical anterior segment, and apex are akinetic. The mid inferoseptal segment and mid anterior segment are hypokinetic. The entire lateral wall, entire inferior wall, basal anteroseptal segment, basal anterior segment, and basal inferoseptal segment are normal.  Right Ventricle: The right ventricular size is normal. Right vetricular wall thickness was not assessed. Right ventricular systolic function is normal. Tricuspid regurgitation signal is inadequate for assessing PA pressure.  Left Atrium: Left atrial size was normal in size.  Right Atrium: Right atrial size was normal in size.  Pericardium: There is no evidence of pericardial effusion.  Mitral Valve: The mitral valve is normal in structure. No evidence of mitral valve regurgitation.  Tricuspid Valve: The tricuspid valve is normal in structure. Tricuspid valve regurgitation is trivial.  Aortic Valve: The aortic valve is tricuspid. Aortic valve regurgitation is not visualized. No aortic stenosis is present.  Pulmonic Valve: The pulmonic valve was not well visualized. Pulmonic valve regurgitation is not visualized.  Aorta: The aortic root and ascending aorta are structurally normal, with no evidence of dilitation.  Venous: The inferior vena cava is normal in size with greater than 50% respiratory variability, suggesting right atrial pressure of 3 mmHg.  IAS/Shunts: The interatrial septum was not well visualized.   LEFT VENTRICLE PLAX 2D LVIDd:         5.10 cm  Diastology LVIDs:         3.10 cm  LV e' medial:    6.64 cm/s LV PW:         0.70 cm  LV E/e' medial:  13.1 LV IVS:        0.60 cm  LV e' lateral:   6.74 cm/s LVOT diam:     1.70 cm  LV E/e' lateral: 12.9 LV SV:         49 LV SV Index:   25 LVOT Area:     2.27  cm   RIGHT VENTRICLE             IVC RV Basal diam:  4.20 cm     IVC diam: 0.80 cm RV S prime:     16.00 cm/s TAPSE (M-mode): 2.6 cm  LEFT ATRIUM             Index       RIGHT ATRIUM           Index LA diam:        3.40 cm 1.76 cm/m  RA Area:  15.20 cm LA Vol (A2C):   43.6 ml 22.52 ml/m RA Volume:   42.00 ml  21.70 ml/m LA Vol (A4C):   52.0 ml 26.86 ml/m LA Biplane Vol: 49.0 ml 25.31 ml/m AORTIC VALVE LVOT Vmax:   109.00 cm/s LVOT Vmean:  75.500 cm/s LVOT VTI:    0.214 m  AORTA Ao Root diam: 2.90 cm Ao Asc diam:  3.40 cm  MITRAL VALVE MV Area (PHT): 3.21 cm     SHUNTS MV Decel Time: 236 msec     Systemic VTI:  0.21 m MV E velocity: 87.00 cm/s   Systemic Diam: 1.70 cm MV A velocity: 104.00 cm/s MV E/A ratio:  0.84  Epifanio Lesches MD Electronically signed by Epifanio Lesches MD Signature Date/Time: 05/30/2020/2:31:29 PM    Final             EKG:   EKG Interpretation Date/Time:  Thursday July 06 2023 10:31:38 EST Ventricular Rate:  62 PR Interval:  202 QRS Duration:  106 QT Interval:  408 QTC Calculation: 414 R Axis:   94  Text Interpretation: Normal sinus rhythm Rightward axis Anterior infarct (cited on or before 20-Mar-2017) ST & T wave abnormality, consider lateral ischemia When compared with ECG of 05-Apr-2022 14:24, Premature ventricular complexes are no longer Present otherwise no significant change noted Confirmed by Randy Moore [phone]) on 07/06/2023 10:34:00 AM    Recent Labs: No results found for requested labs within last 365 days.  Recent Lipid Panel    Component Value Date/Time   CHOL 125 04/30/2021 0921   TRIG 55 04/30/2021 0921   HDL 50 04/30/2021 0921   CHOLHDL 2.5 04/30/2021 0921   CHOLHDL 3.6 09/21/2016 0346   VLDL 6 09/21/2016 0346   LDLCALC 63 04/30/2021 0921     Risk Assessment/Calculations:                Physical Exam:    VS:  BP 138/82 (BP Location: Left Arm, Patient Position: Sitting, Cuff  Size: Normal)   Pulse 62   Ht 5\' 8"  (1.727 m)   Wt 184 lb 9.6 oz (83.7 kg)   SpO2 96%   BMI 28.07 kg/m     Wt Readings from Last 3 Encounters:  07/06/23 184 lb 9.6 oz (83.7 kg)  04/12/22 192 lb (87.1 kg)  04/05/22 195 lb (88.5 kg)     GEN:  Well nourished, well developed in no acute distress HEENT: Normal NECK: No JVD; No carotid bruits LYMPHATICS: No lymphadenopathy CARDIAC: RRR, no murmurs, rubs, gallops RESPIRATORY:  Clear to auscultation without rales, wheezing or rhonchi  ABDOMEN: Soft, non-tender, non-distended MUSCULOSKELETAL:  No edema; No deformity  SKIN: Warm and dry NEUROLOGIC:  Alert and oriented x 3 PSYCHIATRIC:  Normal affect   Assessment & Plan Chronic combined systolic and diastolic heart failure (HCC) The patient is clinically stable with no functional limitation and no evidence of volume excess on exam.  Advised him to increase his carvedilol and Entresto to twice daily dosing as recommended.  Last assessment of his LVEF in 2021 was 40 to 45%.  As he remains asymptomatic there is no indication to update his imaging studies. Essential hypertension Blood pressure is controlled.  He will dose his carvedilol and Entresto twice daily as recommended. Mixed hyperlipidemia Treated with high intensity statin drug on atorvastatin 80 mg daily.  Will update lipids today.  Last labs now 2 years ago showed a cholesterol of 125 and an LDL of 63. Coronary artery disease involving native coronary  artery of native heart without angina pectoris No anginal symptoms.  Advised him to resume aspirin 81 mg daily.  Otherwise continue current medicines. Medication management As above, he is counseled on which medications he is supposed to be taking and how they should be dosed.  We have removed tamsulosin, levothyroxine from his medicine list since he is not taking them.  If his TSH is abnormal, will send to his primary care physician if he needs to get back on levothyroxine.  I will plan  to see him back in 1 year for follow-up evaluation.  We will touch base with him after his labs are available.            Medication Adjustments/Labs and Tests Ordered: Current medicines are reviewed at length with the patient today.  Concerns regarding medicines are outlined above.  Orders Placed This Encounter  Procedures   CBC   Comprehensive metabolic panel   Lipid panel   TSH   EKG 12-Lead   Meds ordered this encounter  Medications   atorvastatin (LIPITOR) 80 MG tablet    Sig: Take 1 tablet (80 mg total) by mouth daily.    Dispense:  90 tablet    Refill:  3   carvedilol (COREG) 6.25 MG tablet    Sig: Take 1 tablet (6.25 mg total) by mouth 2 (two) times daily with a meal.    Dispense:  180 tablet    Refill:  3   sacubitril-valsartan (ENTRESTO) 97-103 MG    Sig: Take 1 tablet by mouth 2 (two) times daily.    Dispense:  180 tablet    Refill:  3    Dose increase    Patient Instructions  Medication Instructions:  RESUME Aspirin 81mg  daily PLEASE TAKE Carvedilol/Coreg and Entresto twice daily  *If you need a refill on your cardiac medications before your next appointment, please call your pharmacy*  Lab Work: CBC, CMET, Lipids, TSH today If you have labs (blood work) drawn today and your tests are completely normal, you will receive your results only by: MyChart Message (if you have MyChart) OR A paper copy in the mail If you have any lab test that is abnormal or we need to change your treatment, we will call you to review the results.  Follow-Up: At [facility], you and your health needs are our priority.  As part of our continuing mission to provide you with exceptional heart care, we have created designated Provider Care Teams.  These Care Teams include your primary Cardiologist (physician) and Advanced Practice Providers (APPs -  Physician Assistants and Nurse Practitioners) who all work together to provide you with the care you need, when you need  it.  Your next appointment:   1 year(s)  Provider:   Tonny Bollman, MD        Signed, Randy Bollman, MD  07/06/2023 11:32 AM    [facility]

## 2023-07-06 NOTE — Assessment & Plan Note (Signed)
The patient is clinically stable with no functional limitation and no evidence of volume excess on exam.  Advised him to increase his carvedilol and Entresto to twice daily dosing as recommended.  Last assessment of his LVEF in 2021 was 40 to 45%.  As he remains asymptomatic there is no indication to update his imaging studies.

## 2023-07-06 NOTE — Assessment & Plan Note (Signed)
Treated with high intensity statin drug on atorvastatin 80 mg daily.  Will update lipids today.  Last labs now 2 years ago showed a cholesterol of 125 and an LDL of 63.

## 2023-07-07 LAB — LIPID PANEL
Chol/HDL Ratio: 3.2 ratio (ref 0.0–5.0)
Cholesterol, Total: 166 mg/dL (ref 100–199)
HDL: 52 mg/dL (ref >39)
LDL Chol Calc (NIH): 103 mg/dL — ABNORMAL HIGH (ref 0–99)
Triglycerides: 56 mg/dL (ref 0–149)
VLDL Cholesterol Cal: 11 mg/dL (ref 5–40)

## 2023-07-07 LAB — COMPREHENSIVE METABOLIC PANEL
ALT: 66 [IU]/L — ABNORMAL HIGH (ref 0–44)
AST: 31 [IU]/L (ref 0–40)
Albumin: 4.1 g/dL (ref 3.8–4.9)
Alkaline Phosphatase: 89 [IU]/L (ref 44–121)
BUN/Creatinine Ratio: 11 (ref 9–20)
BUN: 12 mg/dL (ref 6–24)
Bilirubin Total: 0.5 mg/dL (ref 0.0–1.2)
CO2: 27 mmol/L (ref 20–29)
Calcium: 9 mg/dL (ref 8.7–10.2)
Chloride: 105 mmol/L (ref 96–106)
Creatinine, Ser: 1.1 mg/dL (ref 0.76–1.27)
Globulin, Total: 2.2 g/dL (ref 1.5–4.5)
Glucose: 135 mg/dL — ABNORMAL HIGH (ref 70–99)
Potassium: 3.7 mmol/L (ref 3.5–5.2)
Sodium: 141 mmol/L (ref 134–144)
Total Protein: 6.3 g/dL (ref 6.0–8.5)
eGFR: 77 mL/min/{1.73_m2} (ref >59)

## 2023-07-07 LAB — CBC
Hematocrit: 44.9 % (ref 37.5–51.0)
Hemoglobin: 14.5 g/dL (ref 13.0–17.7)
MCH: 28 pg (ref 26.6–33.0)
MCHC: 32.3 g/dL (ref 31.5–35.7)
MCV: 87 fL (ref 79–97)
Platelets: 186 10*3/uL (ref 150–450)
RBC: 5.17 x10E6/uL (ref 4.14–5.80)
RDW: 12.5 % (ref 11.6–15.4)
WBC: 4.4 10*3/uL (ref 3.4–10.8)

## 2023-07-07 LAB — TSH: TSH: 4.94 u[IU]/mL — ABNORMAL HIGH (ref 0.450–4.500)

## 2023-07-11 ENCOUNTER — Telehealth: Payer: Self-pay

## 2023-07-11 DIAGNOSIS — E782 Mixed hyperlipidemia: Secondary | ICD-10-CM

## 2023-07-11 DIAGNOSIS — Z79899 Other long term (current) drug therapy: Secondary | ICD-10-CM

## 2023-07-11 NOTE — Telephone Encounter (Signed)
-----   Message from Tonny Bollman sent at 07/07/2023  1:19 PM EST ----- TSH is mildly elevated and he should go back on his Synthroid dose.  Defer to his primary care doctor for recommendations.  LDL is elevated, previously 63 now 103.  I suspect he has not been taking his statin drug regularly.  We discussed this at the time of his visit, but please reinforce the importance of taking his statin agent and all of his medications regularly.  Repeat lipid panel in 6 months.  Thanks

## 2023-07-11 NOTE — Telephone Encounter (Signed)
Called and spoke with patient who verbalized understanding. He will reach out to PCP about synthroid. Repeat labs entered and he understands to come by office in February to get done.

## 2023-09-22 ENCOUNTER — Telehealth: Payer: Self-pay | Admitting: Cardiovascular Disease

## 2023-09-22 NOTE — Telephone Encounter (Signed)
 *  STAT* If patient is at the pharmacy, call can be transferred to refill team.   1. Which medications need to be refilled? (please list name of each medication and dose if known)   sacubitril-valsartan (ENTRESTO) 97-103 MG     2. Would you like to learn more about the convenience, safety, & potential cost savings by using the [facility]?    3. Are you open to using the [facility] (Type [facility].   4. Which pharmacy/location (including street and city if local pharmacy) is medication to be sent to? [facility]   5. Do they need a 30 day or 90 day supply? 90 days

## 2023-09-25 MED ORDER — ENTRESTO 97-103 MG PO TABS: 1.0000 | ORAL_TABLET | Freq: Two times a day (BID) | ORAL | 3 refills | Status: DC

## 2023-09-27 ENCOUNTER — Telehealth: Payer: Self-pay | Admitting: Cardiovascular Disease

## 2023-09-27 NOTE — Telephone Encounter (Signed)
  Pt c/o of Chest Pain: STAT if active CP, including tightness, pressure, jaw pain, radiating pain to shoulder/upper arm/back, CP unrelieved by Nitro. Symptoms reported of SOB, nausea, vomiting, sweating.  1. Are you having CP right now?   No  2. Are you experiencing any other symptoms (ex. SOB, nausea, vomiting, sweating)?  No   3. Is your CP continuous or coming and going?   Coming and going  4. Have you taken Nitroglycerin ?  No  5. How long have you been experiencing CP?  2-3 days ago   6. If NO CP at time of call then end call with telling Pt to call back or call 911 if Chest pain returns prior to return call from triage team.   Patient stated he is also having unusual pain in his ankle.   Patient stated his new insurance does not fully cover his Entresto  which is too expensive for him.  Patient wants to get alternate medication.  Patient noted he has not taken his Entresto  since his last visit.

## 2023-09-27 NOTE — Telephone Encounter (Signed)
 Spoke with pt about the chest pain he is experiencing. Pt explained that he has been having intermittent chest pain for the last few days, mostly on the right side, no radiation. The pain does not seem to correlate with activity as pt has had intermittent chest pain while at rest. Pt is not having any SOB, swelling, nausea or vomiting. Pt states he has not been taking his Entresto  since his last visit in November. Pt has also not taken any Nitroglycerin  for the chest pain he is experiencing as the pain does not typically last very long. An appointment was made with Rosaline Bane, PA tomorrow 2/6 at 8:25 AM.

## 2023-09-28 ENCOUNTER — Encounter: Payer: Self-pay | Admitting: Nurse Practitioner

## 2023-09-28 ENCOUNTER — Ambulatory Visit: Payer: Self-pay | Attending: Nurse Practitioner | Admitting: Nurse Practitioner

## 2023-09-28 ENCOUNTER — Other Ambulatory Visit: Payer: Self-pay

## 2023-09-28 VITALS — BP 120/80 | HR 58 | Ht 68.0 in | Wt 184.0 lb

## 2023-09-28 DIAGNOSIS — I2510 Atherosclerotic heart disease of native coronary artery without angina pectoris: Secondary | ICD-10-CM

## 2023-09-28 DIAGNOSIS — I255 Ischemic cardiomyopathy: Secondary | ICD-10-CM

## 2023-09-28 DIAGNOSIS — E785 Hyperlipidemia, unspecified: Secondary | ICD-10-CM | POA: Diagnosis not present

## 2023-09-28 DIAGNOSIS — Z79899 Other long term (current) drug therapy: Secondary | ICD-10-CM

## 2023-09-28 DIAGNOSIS — R079 Chest pain, unspecified: Secondary | ICD-10-CM

## 2023-09-28 MED ORDER — FAMOTIDINE 20 MG PO TABS: 20.0000 mg | ORAL_TABLET | ORAL | 3 refills | Status: AC | PRN

## 2023-09-28 MED ORDER — VALSARTAN 160 MG PO TABS: 160.0000 mg | ORAL_TABLET | Freq: Every day | ORAL | 3 refills | Status: DC

## 2023-09-28 NOTE — Progress Notes (Signed)
 Cardiology Office Note:  .   Date:  09/28/2023  ID:  Randy Moore, DOB 1966/08/03, MRN [id number] PCP: Darra Hamilton, PA-C   [facility] Providers Cardiologist:  Ozell Fell, MD Cardiology APP:  Lelon Hamilton DASEN, PA-C    Patient Profile: .      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 Fell.  He reported he was still playing soccer with no exertional symptoms with that level of activity.  He had some confusion about his medications and was only taking carvedilol  and Entresto  once daily.  He was not taking aspirin  and was no longer on levothyroxine.  EKG revealed NSR at 62 bpm, rightward axis inferior infarct, ST and T wave abnormality, consider lateral ischemia.  He was advised to increase Entresto  and carvedilol  to twice daily dosing as indicated and to resume aspirin  81 mg daily.  TSH obtained that day was mildly elevated and he was advised to resume his Synthroid dose.  Further recommendations from PCP.  LDL was elevated at 103 and he was encouraged to take atorvastatin  daily.  He contacted our office on 09/27/2023 with complaints of chest pain and advised that cost of Entresto  was too high.  He reported intermittent chest pain on the right side with no radiation and not related to activity.  He was also having chest pain at rest.  No associated symptoms of SOB, diaphoresis,n/v.  He had not taken SL NTG as pain did not typically last very long.       History of Present Illness: .   Randy Moore is a very pleasant 58 y.o. male who  is here today with sporadic chest discomfort that has been shifting from left to right. The discomfort started a few days ago and was most severe on Monday, but has since resolved. He is not having any pain presently. He associates the discomfort with belching and eating gas-inducing food. He also experienced a brief period of shortness of breath, but denies sweating and nausea. Over-the-counter antacids like Tums did not provide relief. He reports difficulty affording his heart failure medication, Entresto , and has not been taking it. He has been taking atorvastatin  daily for elevated cholesterol and carvedilol  twice daily for heart failure. He denies shortness of breath with exertion, edema, PND, orthopnea, presyncope, syncope or palpitations. He reports dry mouth at night that requires him to drink water.   Discussed the use of AI scribe software for clinical note transcription with the patient, who gave verbal consent to proceed.   ROS: See HPI       Studies Reviewed: SABRA   EKG Interpretation Date/Time:  Thursday September 28 2023 08:38:06 EST Ventricular Rate:  58 PR Interval:  212 QRS Duration:  108 QT Interval:  414 QTC Calculation: 406 R Axis:   89  Text Interpretation: Sinus bradycardia with 1st degree A-V block ST & T wave abnormality, consider anterolateral ischemia When compared with ECG of 06-Jul-2023 10:31, Criteria for Anterior infarct are no longer Present No significant change since last tracing Confirmed by Percy Browning [id number]) on 09/28/2023 8:45:35 AM    Risk Assessment/Calculations:  Physical Exam:   VS:  BP 120/80   Pulse (!) 58   Ht 5' 8 (1.727 m)   Wt 184 lb (83.5 kg)   SpO2 97%   BMI 27.98 kg/m    Wt Readings from Last 3 Encounters:  09/28/23 184 lb (83.5 kg)  07/06/23 184 lb 9.6 oz (83.7 kg)  04/12/22 192 lb (87.1 kg)    GEN: Well nourished, well developed in no acute distress NECK: No JVD; No carotid bruits CARDIAC: RRR, no murmurs, rubs,  gallops RESPIRATORY:  Clear to auscultation without rales, wheezing or rhonchi  ABDOMEN: Soft, non-tender, non-distended EXTREMITIES:  No edema; No deformity     ASSESSMENT AND PLAN: .    Chest pain: Sporadic chest pain shifts from left to right, with some relief noted last night. EKG today reveals nonspecific ST abnormality with no acute changes. He has not taken SL NTG. Discomfort may be associated with belching and certain foods. He denies significant shortness of breath, n/v, or diaphoresis. He has taken TUMS with little relief, but has not tried Pepcid  which he was prescribed previously. Encouraged him to try regular use of famotidine  (Pepcid ) for potential gastric-related discomfort. Additionally, we we will change Entresto  to valsartan  due to cost issues which may provide some additional relief. Advised him to notify us  if symptoms worsen.   CAD: History of inferior MI with PCI as noted above in 2018. EKG today is unremarkable. Chest pain as noted above atypical for angina.  No indication for further ischemia evaluation at this time. He assures me he has resumed daily aspirin  with no bleeding concerns and reports compliance with atorvastatin . We will continue GDMT including aspirin , atorvastatin , carvedilol , and as needed nitroglycerin .  We are changing Entresto  to valsartan  for management of ischemic cardiomyopathy.  HFrEF/ICM: Most recent echo 05/2020 revealed LVEF 40-45%, G1DD. He appears euvolemic on exam. He denies of breath, edema, orthopnea, PND. Weight is stable. He reports he is unable to afford Entresto .  We will discontinue Entresto  and start valsartan  160 mg daily.  I will see him back in 1 month for follow-up.  Continue carvedilol .  Hypertension: BP is stable today.  I have asked him to monitor consistently at home.  He does have a home BP monitor and will tested for accuracy.  He has not been taking Entresto  for several weeks.  We are starting valsartan  160 mg daily.  Will get BMET  in 2 weeks.  Will have him return in 4 weeks for follow-up.  Hyperlipidemia LDL goal < 70: Lipid panel completed 07/06/23 revealed total cholesterol 166, triglycerides 56, HDL 52, and LDL-C 123.  LDL had increased from 63 2 years prior.  Plan to repeat lipid in 2 weeks with BMET.   Medication management: History of medication noncompliance. Reports cost of Entresto  > $1000 for the year and he is unable to afford that. We have switched him to valsartan . Advised him to notify us  if cost-prohibitive. He reports compliance with atorvastatin , aspirin , and carvedilol .         Disposition:4 weeks with me  Signed, Rosaline Bane, NP-C

## 2023-09-28 NOTE — Patient Instructions (Signed)
 Medication Instructions:  Your physician has recommended you make the following change in your medication:  1-STOP Entresto . 2-START Valsartan  160 mg by mouth daily. 3-TAKE Pepcid  20 mg by mouth twice a day as needed.  *If you need a refill on your cardiac medications before your next appointment, please call your pharmacy*  Lab Work: Your physician recommends that you return for lab work in: 2 weeks for fasting CMET and lipid panel at [facility].  If you have labs (blood work) drawn today and your tests are completely normal, you will receive your results only by: MyChart Message (if you have MyChart) OR A paper copy in the mail If you have any lab test that is abnormal or we need to change your treatment, we will call you to review the results.  Follow-Up: At [facility], you and your health needs are our priority.  As part of our continuing mission to provide you with exceptional heart care, we have created designated Provider Care Teams.  These Care Teams include your primary Cardiologist (physician) and Advanced Practice Providers (APPs -  Physician Assistants and Nurse Practitioners) who all work together to provide you with the care you need, when you need it.  We recommend signing up for the patient portal called MyChart.  Sign up information is provided on this After Visit Summary.  MyChart is used to connect with patients for Virtual Visits (Telemedicine).  Patients are able to view lab/test results, encounter notes, upcoming appointments, etc.  Non-urgent messages can be sent to your provider as well.   To learn more about what you can do with MyChart, go to [email or web address].    Your next appointment:   4 week(s)  Provider:   Rosaline Bane, NP     Other Instructions   1st Floor: - Lobby - Registration  - Pharmacy  - Lab - Cafe  2nd Floor: - PV Lab - Diagnostic Testing (echo, CT, nuclear med)  3rd Floor: - Vacant  4th Floor: - TCTS  (cardiothoracic surgery) - AFib Clinic - Structural Heart Clinic - Vascular Surgery  - Vascular Ultrasound  5th Floor: - [facility] Cardiology (general and EP) - Clinical Pharmacy for coumadin, hypertension, lipid, weight-loss medications, and med management appointments    Valet parking services will be available as well.

## 2023-10-17 LAB — COMPREHENSIVE METABOLIC PANEL
ALT: 37 [IU]/L (ref 0–44)
AST: 29 [IU]/L (ref 0–40)
Albumin: 4.3 g/dL (ref 3.8–4.9)
Alkaline Phosphatase: 86 [IU]/L (ref 44–121)
BUN/Creatinine Ratio: 10 (ref 9–20)
BUN: 9 mg/dL (ref 6–24)
Bilirubin Total: 0.8 mg/dL (ref 0.0–1.2)
CO2: 25 mmol/L (ref 20–29)
Calcium: 9.5 mg/dL (ref 8.7–10.2)
Chloride: 107 mmol/L — ABNORMAL HIGH (ref 96–106)
Creatinine, Ser: 0.94 mg/dL (ref 0.76–1.27)
Globulin, Total: 2.4 g/dL (ref 1.5–4.5)
Glucose: 101 mg/dL — ABNORMAL HIGH (ref 70–99)
Potassium: 4.6 mmol/L (ref 3.5–5.2)
Sodium: 145 mmol/L — ABNORMAL HIGH (ref 134–144)
Total Protein: 6.7 g/dL (ref 6.0–8.5)
eGFR: 93 mL/min/{1.73_m2} (ref >59)

## 2023-10-17 LAB — LIPID PANEL
Chol/HDL Ratio: 2.1 {ratio} (ref 0.0–5.0)
Cholesterol, Total: 114 mg/dL (ref 100–199)
HDL: 55 mg/dL (ref >39)
LDL Chol Calc (NIH): 46 mg/dL (ref 0–99)
Triglycerides: 57 mg/dL (ref 0–149)
VLDL Cholesterol Cal: 13 mg/dL (ref 5–40)

## 2023-10-25 NOTE — Progress Notes (Unsigned)
 Cardiology Office Note:  .   Date:  10/26/2023  ID:  Randy Moore, DOB [birth year], MRN [id number] PCP: Lindaann Pascal, PA-C  [facility] Providers Cardiologist:  Tonny Bollman, MD Cardiology APP:  Beatrice Lecher, PA-C    Patient Profile: .      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 Excell Seltzer.  He reported he was still playing soccer with no exertional symptoms with that level of activity.  He had some confusion about his medications and was only taking carvedilol and Entresto once daily.  He was not taking aspirin and was no longer on levothyroxine.  EKG revealed NSR at 62 bpm, rightward axis inferior infarct, ST and T wave abnormality, consider lateral ischemia.  He was advised to increase Entresto and carvedilol to twice daily dosing as indicated and to resume aspirin 81 mg daily.  TSH obtained that day was mildly elevated and he was advised to resume his Synthroid dose.  Further recommendations from PCP.  LDL was elevated at 103 and he was encouraged to take atorvastatin daily.  He contacted our office on 09/27/2023 with complaints of chest pain and advised that cost of Entresto was too high.  He reported intermittent chest pain on the right side with no radiation and not related to activity.  He was also having chest pain at rest.  No associated symptoms of SOB, diaphoresis,n/v.  He had not taken SL NTG as pain did not typically last very long.  Seen in clinic by me on 09/28/23 at which time he reported sporadic chest discomfort that  has been shifting from left to right. The discomfort started a few days ago and was most severe a few days prior, but has since resolved. He is not having any pain presently. He associates the discomfort with belching and eating gas-inducing food. He also experienced a brief period of shortness of breath, but denies sweating and nausea. Over-the-counter antacids like Tums did not provide relief. Having difficulty affording Entresto, and has not been taking it. He has been taking atorvastatin daily and carvedilol twice daily as prescribed. He denied shortness of breath with exertion, edema, PND, orthopnea, presyncope, syncope or palpitations. He reports dry mouth at night that requires him to drink water. Due to cost concerns, we discontinued Entresto and started valsartan 160 mg daily.  I will see him back in 1 month for follow-up.       History of Present Illness: .   Randy Moore is a very pleasant 58 y.o. male who is here today for follow-up of HFrEF. He reports significant improvement since starting valsartan. He denies the chest discomfort and breathing difficulties experienced prior to the last visit. He also denies edema and chest pain, and is able to perform daily activities without limitation. BP has been well controlled at home. He reports a decrease in water intake due to urinary urgency, which has been a source of embarrassment at work. He currently consumes approximately two bottles of water per day, but is willing to increase to four bottles.  Reports weight is stable.  He does not have  any shortness of breath, orthopnea, PND, edema, presyncope or syncope.   Discussed the use of AI scribe software for clinical note transcription with the patient, who gave verbal consent to proceed.   ROS: See HPI       Studies Reviewed: .        Risk Assessment/Calculations:             Physical Exam:   VS:  BP 130/78   Pulse 65   Ht 5\' 8"  (1.727 m)   Wt 185 lb 3.2 oz (84 kg)   SpO2 98%   BMI  28.16 kg/m    Wt Readings from Last 3 Encounters:  10/26/23 185 lb 3.2 oz (84 kg)  09/28/23 184 lb (83.5 kg)  07/06/23 184 lb 9.6 oz (83.7 kg)    GEN: Well nourished, well developed in no acute distress NECK: No JVD; No carotid bruits CARDIAC: RRR, no murmurs, rubs, gallops RESPIRATORY:  Clear to auscultation without rales, wheezing or rhonchi  ABDOMEN: Soft, non-tender, non-distended EXTREMITIES:  No edema; No deformity     ASSESSMENT AND PLAN: .    Chest pain: Chest discomfort has resolved since I last saw him. He attributes this to getting back on his medications for heart failure.    CAD: History of inferior MI severe multivessel CAD on cath, PCI/DES to mLAD and ramus intermediate branch as noted above in 2018. He denies chest pain, dyspnea, or other symptoms concerning for angina.  No indication for further ischemia evaluation at this time.  No bleeding concerns.  Continue aspirin, atorvastatin, carvedilol, valsartan.  HFrEF/ICM: Most recent echo 05/2020 revealed LVEF 40-45%, G1DD. Seen by me 4 weeks ago with chest discomfort that developed after running out of Entresto. It was not affordable for him so we switched him to valsartan 160 mg daily.  Today he reports significant improvement in his symptoms since starting valsartan.  Appears euvolemic on exam. He denies of breath, edema, orthopnea, PND. Weight is stable. Renal function is stable.  We will continue GDMT including carvedilol and valsartan.  Hypertension: BP is well controlled. Renal function is stable. No medication changes today.   Hyperlipidemia LDL goal < 55: Lipid panel completed 10/16/2023 with total cholesterol 114, HDL 55, LDL 46, and triglycerides 57. Lipids are well controlled. Continue atorvastatin.         Disposition:6 months with Dr. Excell Seltzer  Signed, Eligha Bridegroom, NP-C

## 2023-10-26 ENCOUNTER — Ambulatory Visit: Payer: Managed Care, Other (non HMO) | Attending: Nurse Practitioner | Admitting: Nurse Practitioner

## 2023-10-26 ENCOUNTER — Encounter: Payer: Self-pay | Admitting: Nurse Practitioner

## 2023-10-26 VITALS — BP 130/78 | HR 65 | Ht 68.0 in | Wt 185.2 lb

## 2023-10-26 DIAGNOSIS — I5042 Chronic combined systolic (congestive) and diastolic (congestive) heart failure: Secondary | ICD-10-CM

## 2023-10-26 DIAGNOSIS — I255 Ischemic cardiomyopathy: Secondary | ICD-10-CM

## 2023-10-26 DIAGNOSIS — I10 Essential (primary) hypertension: Secondary | ICD-10-CM | POA: Diagnosis not present

## 2023-10-26 DIAGNOSIS — R079 Chest pain, unspecified: Secondary | ICD-10-CM

## 2023-10-26 DIAGNOSIS — E785 Hyperlipidemia, unspecified: Secondary | ICD-10-CM

## 2023-10-26 DIAGNOSIS — I2510 Atherosclerotic heart disease of native coronary artery without angina pectoris: Secondary | ICD-10-CM

## 2023-10-26 NOTE — Patient Instructions (Addendum)
 Medication Instructions:  Your physician recommends that you continue on your current medications as directed. Please refer to the Current Medication list given to you today.  *If you need a refill on your cardiac medications before your next appointment, please call your pharmacy*   Lab Work: None Ordered If you have labs (blood work) drawn today and your tests are completely normal, you will receive your results only by: MyChart Message (if you have MyChart) OR A paper copy in the mail If you have any lab test that is abnormal or we need to change your treatment, we will call you to review the results.   Testing/Procedures: None Ordered   Follow-Up: At [facility], you and your health needs are our priority.  As part of our continuing mission to provide you with exceptional heart care, we have created designated Provider Care Teams.  These Care Teams include your primary Cardiologist (physician) and Advanced Practice Providers (APPs -  Physician Assistants and Nurse Practitioners) who all work together to provide you with the care you need, when you need it.  We recommend signing up for the patient portal called "MyChart".  Sign up information is provided on this After Visit Summary.  MyChart is used to connect with patients for Virtual Visits (Telemedicine).  Patients are able to view lab/test results, encounter notes, upcoming appointments, etc.  Non-urgent messages can be sent to your provider as well.   To learn more about what you can do with MyChart, go to [email or web address].    Your next appointment:   6 month(s)  Provider:   Tonny Bollman, MD     Other Instructions    Adopting a Healthy Lifestyle.   Weight: Know what a healthy weight is for you (roughly BMI <25) and aim to maintain this. You can calculate your body mass index on your smart phone. Unfortunately, this is not the most accurate measure of healthy weight, but it is the simplest measurement to  use. A more accurate measurement involves body scanning which measures lean muscle, fat tissue and bony density. We do not have this equipment at [facility].    Diet: Aim for 7+ servings of fruits and vegetables daily Limit animal fats in diet for cholesterol and heart health - choose grass fed whenever available Avoid highly processed foods (fast food burgers, tacos, fried chicken, pizza, hot dogs, french fries)  Saturated fat comes in the form of butter, lard, coconut oil, margarine, partially hydrogenated oils, and fat in meat. These increase your risk of cardiovascular disease.  Use healthy plant oils, such as olive, canola, soy, corn, sunflower and peanut.  Whole foods such as fruits, vegetables and whole grains have fiber  Men need > 38 grams of fiber per day Women need > 25 grams of fiber per day  Load up on vegetables and fruits - one-half of your plate: Aim for color and variety, and remember that potatoes dont count. Go for whole grains - one-quarter of your plate: Whole wheat, barley, wheat berries, quinoa, oats, brown rice, and foods made with them. If you want pasta, go with whole wheat pasta. Protein power - one-quarter of your plate: Fish, chicken, beans, and nuts are all healthy, versatile protein sources. Limit red meat. You need carbohydrates for energy! The type of carbohydrate is more important than the amount. Choose carbohydrates such as vegetables, fruits, whole grains, beans, and nuts in the place of white rice, white pasta, potatoes (baked or fried), macaroni and cheese, cakes, cookies, and donuts.  If youre thirsty, drink water. Coffee and tea are good in moderation, but skip sugary drinks and limit milk and dairy products to one or two daily servings. Keep sugar intake at 6 teaspoons or 24 grams or LESS       Exercise: Aim for 150 min of moderate intensity exercise weekly for heart health, and weights twice weekly for bone health Stay active - any steps are better  than no steps! Aim for 7-9 hours of sleep daily

## 2024-02-24 ENCOUNTER — Other Ambulatory Visit: Payer: Self-pay | Admitting: Cardiovascular Disease

## 2024-06-19 ENCOUNTER — Ambulatory Visit: Payer: Self-pay | Attending: Cardiovascular Disease | Admitting: Cardiovascular Disease

## 2024-06-19 ENCOUNTER — Encounter: Payer: Self-pay | Admitting: Cardiovascular Disease

## 2024-06-19 VITALS — BP 146/85 | HR 62 | Ht 68.0 in | Wt 192.0 lb

## 2024-06-19 DIAGNOSIS — E785 Hyperlipidemia, unspecified: Secondary | ICD-10-CM | POA: Diagnosis not present

## 2024-06-19 DIAGNOSIS — I5042 Chronic combined systolic (congestive) and diastolic (congestive) heart failure: Secondary | ICD-10-CM

## 2024-06-19 DIAGNOSIS — I2510 Atherosclerotic heart disease of native coronary artery without angina pectoris: Secondary | ICD-10-CM | POA: Diagnosis not present

## 2024-06-19 DIAGNOSIS — I10 Essential (primary) hypertension: Secondary | ICD-10-CM

## 2024-06-19 NOTE — Assessment & Plan Note (Signed)
 Managed with carvedilol  and valsartan .  No evidence of volume overload on exam.  LVEF at last check was 40 to 45%.  I have recommended an updated 2D echocardiogram in the setting of his fatigue and exercise intolerance.

## 2024-06-19 NOTE — Assessment & Plan Note (Signed)
 Appears to be doing well with no anginal symptoms of chest pain or pressure.  Continue aspirin , beta-blocker, and ARB, and a high intensity statin drug.

## 2024-06-19 NOTE — Progress Notes (Signed)
 Cardiology Office Note:    Date:  06/19/2024   ID:  Veria DELENA Single, DOB Feb 15, 1966, MRN [id number]  PCP:  Darra Hamilton, PA-C   [facility] Providers Cardiologist:  Ozell Fell, MD Cardiology APP:  Lelon Hamilton ONEIDA DEVONNA     Referring MD: Darra Hamilton, [place]   Chief Complaint  Patient presents with   Coronary Artery Disease    History of Present Illness:    MELDON HANZLIK is a 58 y.o. 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  The patient had been off of some of his medications and his cholesterol was above goal when I saw him with an LDL of 103.  Now back on appropriate lipid-lowering therapy his most recent LDL was 46.  He was switched from Entresto  to valsartan  due to prohibitive cost.  The patient is here alone today.  He describes some mild exercise intolerance that has come up in the last year.  He denies any chest pain, chest pressure, or shortness of breath.  Has had no leg swelling, orthopnea, PND, or heart palpitations.  He denies lightheadedness or syncope.  When he goes out and play soccer he feels like he just cannot run quite as well as he used to.  He states that he is not sure whether this is part of the aging process or there is some cardiac limitation.  He otherwise is doing well and reports good compliance with his medications.  Current Medications: Current Meds   Medication Sig   aspirin  EC 81 MG EC tablet Take 1 tablet (81 mg total) by mouth daily.   atorvastatin  (LIPITOR ) 80 MG tablet Take 1 tablet (80 mg total) by mouth daily.   carvedilol  (COREG ) 6.25 MG tablet Take 1 tablet (6.25 mg total) by mouth 2 (two) times daily with a meal.   famotidine  (PEPCID ) 20 MG tablet Take 1 tablet (20 mg total) by mouth as needed. TAKE 1 TABLET(20 MG) BY MOUTH TWICE DAILY   levothyroxine (SYNTHROID) 50 MCG tablet Take 50 mcg by mouth daily before breakfast.   nitroGLYCERIN  (NITROSTAT ) 0.4 MG SL tablet PLACE 1 TABLET UNDER TONGUE EVERY 5 MINUTES FOR 3 DOSES AS NEEDED FOR CHEST PAIN   valsartan  (DIOVAN ) 160 MG tablet Take 1 tablet (160 mg total) by mouth daily.     Allergies:   Patient has no known allergies.   ROS:   Please see the history of present illness.    All other systems reviewed and are negative.  EKGs/Labs/Other Studies Reviewed:    The following studies were reviewed today: Cardiac Studies & Procedures   ______________________________________________________________________________________________ CARDIAC CATHETERIZATION  CARDIAC CATHETERIZATION 09/21/2016  Conclusion 1. Acute inferior wall and RV myocardial infarction complicated by bradycardia and hypotension, treated successfully with primary PCI using a drug-eluting stent 2. Severe multivessel coronary artery disease with subtotal occlusion of the LAD, severe stenosis of the first diagonal, and  severe stenosis of the ramus intermedius branch 3. Moderate segmental LV systolic dysfunction with severe elevation of LVEDP  Recommendations: The patient will be loaded with brilinta . Will continue Aggrastat  for 6 hours. He may require diuresis considering his elevated LVEDP. Will need to decide on further revascularization. Favor an attempted staged PCI of the LAD with aggressive medical therapy. If LAD PCI is unsuccessful would consider CABG.  Findings Coronary Findings Diagnostic  Dominance:  Right  Left Anterior Descending There is a complex high-grade stenosis of the proximal LAD involving the origin of the first diagonal. The LAD has 99% stenosis with TIMI 2 flow. There is antegrade flow that reaches the LV apex. The diagonal is a moderate size vessel with 80% stenosis present.  First Diagonal Branch  Ramus Intermedius There is diffuse disease of a large ramus intermedius and the proximal and mid vessel, estimated at 70% stenosis  Left Circumflex  Right Coronary Artery  Intervention  Prox RCA lesion Angioplasty A drug eluting stent was successfully placed. Post-stent angioplasty was performed. There is no pre-interventional antegrade distal flow (TIMI 0).  The post-interventional distal flow is normal (TIMI 3). The intervention was successful . No complications occurred at this lesion. Heparin  and Aggrastat  are used for anticoagulation. A JR4 guide catheter is used a administrator, arts is advanced across the occlusion. The lesion is predilated with a 2.5 mm balloon and stented with a 2.5 x 20 mm Promus DES. The stent is postdilated with a 2.75 mm noncompliant balloon to 14 atm. At the completion of the procedure there is TIMI 3 flow in the vessel. There is a 0% residual stenosis post intervention.   CARDIAC CATHETERIZATION  CARDIAC CATHETERIZATION 09/22/2016  Conclusion  Mid Cx lesion, 50 %stenosed.  Ost Ramus to Ramus lesion, 60 %stenosed.  A STENT PROMUS PREM MR 3.0X28 drug eluting stent was successfully placed.  Ramus lesion, 80 %stenosed.  Post intervention, there is a 0% residual stenosis.  A STENT PROMUS PREM MR 3.0X38 drug eluting stent was successfully placed.  Mid LAD lesion, 100 %stenosed.  Post intervention, there is a 0% residual stenosis.  Ost 2nd Diag lesion, 80 %stenosed.  Post intervention, there is a 80% residual stenosis.  Ost LAD to Mid LAD lesion, 40 %stenosed.  Ost Cx to Prox Cx lesion, 30 %stenosed.  1. Chronic total occlusion mid  LAD 2. Successful PTCA/DES x 1 mid LAD 3. Severe stenosis ramus intermediate branch 4. Successful PTCA/DES x 1 intermediate branch  Recommendations: Continue ASA and Brilinta  for one year. Continue beta blocker and statin.  Findings Coronary Findings Diagnostic  Dominance: Right  Left Anterior Descending  The lesion is chronically occluded.  First Diagonal Branch Vessel is moderate in size.  Second Diagonal Branch Vessel is moderate in size.  Second Septal Branch Vessel is small in size.  Third Septal Branch Vessel is small in size.  Ramus Intermedius Vessel is large.  Left Circumflex  Intervention  Mid LAD lesion Angioplasty Lesion crossed with guidewire. Pre-stent angioplasty was performed using a BALLOON EUPHORA RX2.5X15. A STENT PROMUS PREM MR 3.0X38 drug eluting stent was successfully placed. Stent strut is well apposed. Post-stent angioplasty was performed using a BALLOON [place] EUPHORA [id number]. There is no pre-interventional antegrade distal flow (TIMI 0).  The post-interventional distal flow is normal (TIMI 3). The intervention was successful . No complications occurred at this lesion. There is a 0% residual stenosis post intervention.  Ost 2nd Diag lesion Angioplasty Lesion crossed with guidewire. Angioplasty alone was performed using a  BALLOON EUPHORA RX2.5X15. The pre-interventional distal flow is normal (TIMI 3).  The post-interventional distal flow is decreased (TIMI 2). The intervention was successful . No complications occurred at this lesion. There is a 80% residual stenosis post intervention.  Ost Ramus to Ramus lesion Angioplasty (Also treats lesions: Ramus) Lesion crossed with guidewire. Pre-stent angioplasty was performed using a BALLOON EUPHORA RX2.5X15. A STENT PROMUS PREM MR 3.0X28 drug eluting stent was successfully placed. Stent strut is well apposed. Post-stent angioplasty was performed using a BALLOON [place] EUPHORA [id number]. The pre-interventional  distal flow is normal (TIMI 3).  The post-interventional distal flow is normal (TIMI 3). The intervention was successful . No complications occurred at this lesion. There is a 0% residual stenosis post intervention.  Ramus lesion Angioplasty (Also treats lesions: Ost Ramus to Ramus) See details in Ost Ramus to Ramus lesion. There is a 0% residual stenosis post intervention.   STRESS TESTS  MYOCARDIAL PERFUSION IMAGING 02/01/2019  Interpretation Summary  Nuclear stress EF: 26%.  There was no ST segment deviation noted during stress.  Defect 1: There is a large defect of severe severity present in the basal anterior, basal anteroseptal, basal inferoseptal, mid anterior, mid anteroseptal, mid inferoseptal, mid inferior, apical anterior, apical septal, apical inferior, apical lateral and apex location.  Findings consistent with prior myocardial infarction.  This is a high risk study.  The left ventricular ejection fraction is severely decreased (<30%).  No ischemia noted. All defects have no reversibility.  At the apex, the inferior wall is absent and fixed. The apical cap, anterior, septal and lateral walls are severe and fixed.  At the mid ventricle, the anterior wall is absent and fixed. The inferior wall and septum are severe and fixed. Lateral wall has normal perfusion.  At the base, anteroseptum is severe and fixed. Inferoseptum is moderate and fixed. Inferior wall is mild and fixed. Anterior wall and lateral wall have relative normal perfusion.  LV appears severely dilated, and ejection fraction is severely reduced. Wall motion severely hypokinetic, with akinesis of the apex and mid anterior wall.  High risk study.   ECHOCARDIOGRAM  ECHOCARDIOGRAM COMPLETE 05/29/2020  Narrative ECHOCARDIOGRAM REPORT    Patient Name:   FRANCE NOYCE   Date of Exam: 05/29/2020 Medical Rec #:  [id number]     Height:       68.0 in Accession #:    [id number]    Weight:       176.0 lb Date  of Birth:  March 05, 1966     BSA:          1.936 m Patient Age:    54 years      BP:           104/72 mmHg Patient Gender: M             HR:           70 bpm. Exam Location:  [address]  Procedure: 2D Echo, Cardiac Doppler, Color Doppler and Intracardiac Opacification Agent  Indications:    I50.9 Congestive heart failure.  History:        Patient has prior history of Echocardiogram examinations, most recent 02/18/2019. Risk Factors:Hypertension and Dyslipidemia. Cardiogenic shock. Chronic systolic heart failure. STEMI. Ischemic cardiomyopathy. Coronary artery disease.  Sonographer:    Carl Rodgers-Jones RDCS Referring Phys: [id number]  IMPRESSIONS   1. Left ventricular ejection fraction, by estimation, is 40 to 45%. The left ventricle has mildly decreased function. The left ventricle demonstrates regional wall motion abnormalities (  see scoring diagram/findings for description). Left ventricular diastolic parameters are consistent with Grade I diastolic dysfunction (impaired relaxation). 2. Right ventricular systolic function is normal. The right ventricular size is normal. Tricuspid regurgitation signal is inadequate for assessing PA pressure. 3. The mitral valve is normal in structure. No evidence of mitral valve regurgitation. 4. The aortic valve is tricuspid. Aortic valve regurgitation is not visualized. No aortic stenosis is present. 5. The inferior vena cava is normal in size with greater than 50% respiratory variability, suggesting right atrial pressure of 3 mmHg.  FINDINGS Left Ventricle: Left ventricular ejection fraction, by estimation, is 40 to 45%. The left ventricle has mildly decreased function. The left ventricle demonstrates regional wall motion abnormalities. Definity  contrast agent was given IV to delineate the left ventricular endocardial borders. The left ventricular internal cavity size was normal in size. There is no left ventricular hypertrophy. Left  ventricular diastolic parameters are consistent with Grade I diastolic dysfunction (impaired relaxation).   LV Wall Scoring: The mid and distal anterior septum, apical anterior segment, and apex are akinetic. The mid inferoseptal segment and mid anterior segment are hypokinetic. The entire lateral wall, entire inferior wall, basal anteroseptal segment, basal anterior segment, and basal inferoseptal segment are normal.  Right Ventricle: The right ventricular size is normal. Right vetricular wall thickness was not assessed. Right ventricular systolic function is normal. Tricuspid regurgitation signal is inadequate for assessing PA pressure.  Left Atrium: Left atrial size was normal in size.  Right Atrium: Right atrial size was normal in size.  Pericardium: There is no evidence of pericardial effusion.  Mitral Valve: The mitral valve is normal in structure. No evidence of mitral valve regurgitation.  Tricuspid Valve: The tricuspid valve is normal in structure. Tricuspid valve regurgitation is trivial.  Aortic Valve: The aortic valve is tricuspid. Aortic valve regurgitation is not visualized. No aortic stenosis is present.  Pulmonic Valve: The pulmonic valve was not well visualized. Pulmonic valve regurgitation is not visualized.  Aorta: The aortic root and ascending aorta are structurally normal, with no evidence of dilitation.  Venous: The inferior vena cava is normal in size with greater than 50% respiratory variability, suggesting right atrial pressure of 3 mmHg.  IAS/Shunts: The interatrial septum was not well visualized.   LEFT VENTRICLE PLAX 2D LVIDd:         5.10 cm  Diastology LVIDs:         3.10 cm  LV e' medial:    6.64 cm/s LV PW:         0.70 cm  LV E/e' medial:  13.1 LV IVS:        0.60 cm  LV e' lateral:   6.74 cm/s LVOT diam:     1.70 cm  LV E/e' lateral: 12.9 LV SV:         49 LV SV Index:   25 LVOT Area:     2.27 cm   RIGHT VENTRICLE             IVC RV  Basal diam:  4.20 cm     IVC diam: 0.80 cm RV S prime:     16.00 cm/s TAPSE (M-mode): 2.6 cm  LEFT ATRIUM             Index       RIGHT ATRIUM           Index LA diam:        3.40 cm 1.76 cm/m  RA Area:  15.20 cm LA Vol (A2C):   43.6 ml 22.52 ml/m RA Volume:   42.00 ml  21.70 ml/m LA Vol (A4C):   52.0 ml 26.86 ml/m LA Biplane Vol: 49.0 ml 25.31 ml/m AORTIC VALVE LVOT Vmax:   109.00 cm/s LVOT Vmean:  75.500 cm/s LVOT VTI:    0.214 m  AORTA Ao Root diam: 2.90 cm Ao Asc diam:  3.40 cm  MITRAL VALVE MV Area (PHT): 3.21 cm     SHUNTS MV Decel Time: 236 msec     Systemic VTI:  0.21 m MV E velocity: 87.00 cm/s   Systemic Diam: 1.70 cm MV A velocity: 104.00 cm/s MV E/A ratio:  0.84  Lonni Nanas MD Electronically signed by Lonni Nanas MD Signature Date/Time: 05/30/2020/2:31:29 PM    Final          ______________________________________________________________________________________________      EKG:        Recent Labs: 07/06/2023: Hemoglobin 14.5; Platelets 186; TSH 4.940 10/16/2023: ALT 37; BUN 9; Creatinine, Ser 0.94; Potassium 4.6; Sodium 145  Recent Lipid Panel    Component Value Date/Time   CHOL 114 10/16/2023 1331   TRIG 57 10/16/2023 1331   HDL 55 10/16/2023 1331   CHOLHDL 2.1 10/16/2023 1331   CHOLHDL 3.6 09/21/2016 0346   VLDL 6 09/21/2016 0346   LDLCALC 46 10/16/2023 1331        Physical Exam:    VS:  BP (!) 146/85   Pulse 62   Ht 5' 8 (1.727 m)   Wt 192 lb (87.1 kg)   SpO2 96%   BMI 29.19 kg/m     Wt Readings from Last 3 Encounters:  06/19/24 192 lb (87.1 kg)  10/26/23 185 lb 3.2 oz (84 kg)  09/28/23 184 lb (83.5 kg)     GEN:  Well nourished, well developed in no acute distress HEENT: Normal NECK: No JVD; No carotid bruits LYMPHATICS: No lymphadenopathy CARDIAC: RRR, no murmurs, rubs, gallops RESPIRATORY:  Clear to auscultation without rales, wheezing or rhonchi  ABDOMEN: Soft, non-tender,  non-distended MUSCULOSKELETAL:  No edema; No deformity  SKIN: Warm and dry NEUROLOGIC:  Alert and oriented x 3 PSYCHIATRIC:  Normal affect   Assessment & Plan Chronic combined systolic and diastolic heart failure (HCC) Managed with carvedilol  and valsartan .  No evidence of volume overload on exam.  LVEF at last check was 40 to 45%.  I have recommended an updated 2D echocardiogram in the setting of his fatigue and exercise intolerance. Primary hypertension Continue valsartan  and carvedilol  at current doses. Hyperlipidemia LDL goal <55 Treated with atorvastatin .  Last lipids at goal with LDL less than 55. Coronary artery disease involving native coronary artery of native heart without angina pectoris Appears to be doing well with no anginal symptoms of chest pain or pressure.  Continue aspirin , beta-blocker, and ARB, and a high intensity statin drug.      Medication Adjustments/Labs and Tests Ordered: Current medicines are reviewed at length with the patient today.  Concerns regarding medicines are outlined above.  Orders Placed This Encounter  Procedures   ECHOCARDIOGRAM COMPLETE   No orders of the defined types were placed in this encounter.   Patient Instructions  Medication Instructions:  Your physician recommends that you continue on your current medications as directed. Please refer to the Current Medication list given to you today.  *If you need a refill on your cardiac medications before your next appointment, please call your pharmacy*  Testing/Procedures: Your physician has requested that you have  an echocardiogram. Echocardiography is a painless test that uses sound waves to create images of your heart. It provides your doctor with information about the size and shape of your heart and how well your heart's chambers and valves are working. This procedure takes approximately one hour. There are no restrictions for this procedure. Please do NOT wear cologne, perfume,  aftershave, or lotions (deodorant is allowed). Please arrive 15 minutes prior to your appointment time.  Please note: We ask at that you not bring children with you during ultrasound (echo/ vascular) testing. Due to room size and safety concerns, children are not allowed in the ultrasound rooms during exams. Our front office staff cannot provide observation of children in our lobby area while testing is being conducted. An adult accompanying a patient to their appointment will only be allowed in the ultrasound room at the discretion of the ultrasound technician under special circumstances. We apologize for any inconvenience.  Follow-Up: At [facility], you and your health needs are our priority.  As part of our continuing mission to provide you with exceptional heart care, our providers are all part of one team.  This team includes your primary Cardiologist (physician) and Advanced Practice Providers or APPs (Physician Assistants and Nurse Practitioners) who all work together to provide you with the care you need, when you need it.  Your next appointment:   6 month(s)  Provider:   One of our Advanced Practice Providers (APPs): Morse Clause, PA-C  Lamarr Satterfield, NP Miriam Shams, NP  Olivia Pavy, PA-C Josefa Beauvais, NP  Leontine Salen, PA-C Orren Fabry, PA-C  [place], PA-C Ernest Dick, NP  Damien Braver, NP Jon Hails, PA-C  Waddell Donath, PA-C    Dayna Dunn, PA-C  Scott Weaver, PA-C Lum Louis, NP Katlyn West, NP Callie Goodrich, PA-C  Xika Zhao, NP Sheng Haley, PA-C    Kathleen Johnson, PA-C   Then, Ozell Fell, MD will plan to see you again in 1 year(s).   We recommend signing up for the patient portal called MyChart.  Sign up information is provided on this After Visit Summary.  MyChart is used to connect with patients for Virtual Visits (Telemedicine).  Patients are able to view lab/test results, encounter notes, upcoming appointments, etc.  Non-urgent  messages can be sent to your provider as well.    To learn more about what you can do with MyChart, go to [email or web address].    Signed, Ozell Fell, MD  06/19/2024 2:31 PM    [facility]

## 2024-06-19 NOTE — Patient Instructions (Signed)
 Medication Instructions:  Your physician recommends that you continue on your current medications as directed. Please refer to the Current Medication list given to you today.  *If you need a refill on your cardiac medications before your next appointment, please call your pharmacy*  Testing/Procedures: Your physician has requested that you have an echocardiogram. Echocardiography is a painless test that uses sound waves to create images of your heart. It provides your doctor with information about the size and shape of your heart and how well your heart's chambers and valves are working. This procedure takes approximately one hour. There are no restrictions for this procedure. Please do NOT wear cologne, perfume, aftershave, or lotions (deodorant is allowed). Please arrive 15 minutes prior to your appointment time.  Please note: We ask at that you not bring children with you during ultrasound (echo/ vascular) testing. Due to room size and safety concerns, children are not allowed in the ultrasound rooms during exams. Our front office staff cannot provide observation of children in our lobby area while testing is being conducted. An adult accompanying a patient to their appointment will only be allowed in the ultrasound room at the discretion of the ultrasound technician under special circumstances. We apologize for any inconvenience.  Follow-Up: At [facility], you and your health needs are our priority.  As part of our continuing mission to provide you with exceptional heart care, our providers are all part of one team.  This team includes your primary Cardiologist (physician) and Advanced Practice Providers or APPs (Physician Assistants and Nurse Practitioners) who all work together to provide you with the care you need, when you need it.  Your next appointment:   6 month(s)  Provider:   One of our Advanced Practice Providers (APPs): Morse Clause, PA-C  Lamarr Satterfield, NP Miriam Shams, NP  Olivia Pavy, PA-C Josefa Beauvais, NP  Leontine Salen, PA-C Orren Fabry, PA-C  [place], PA-C Ernest Dick, NP  Damien Braver, NP Jon Hails, PA-C  Waddell Donath, PA-C    Dayna Dunn, PA-C  Scott Weaver, PA-C Lum Louis, NP Katlyn West, NP Callie Goodrich, PA-C  Xika Zhao, NP Sheng Haley, PA-C    Kathleen Kuzey Ogata, PA-C   Then, Ozell Fell, MD will plan to see you again in 1 year(s).   We recommend signing up for the patient portal called MyChart.  Sign up information is provided on this After Visit Summary.  MyChart is used to connect with patients for Virtual Visits (Telemedicine).  Patients are able to view lab/test results, encounter notes, upcoming appointments, etc.  Non-urgent messages can be sent to your provider as well.    To learn more about what you can do with MyChart, go to [email or web address].

## 2024-07-04 ENCOUNTER — Ambulatory Visit (HOSPITAL_COMMUNITY)
Admission: RE | Admit: 2024-07-04 | Discharge: 2024-07-04 | Disposition: A | Discharge status: Home / Self Care | Source: Ambulatory Visit | Attending: Cardiology | Admitting: Cardiology

## 2024-07-04 DIAGNOSIS — I5042 Chronic combined systolic (congestive) and diastolic (congestive) heart failure: Secondary | ICD-10-CM | POA: Diagnosis present

## 2024-07-04 LAB — ECHOCARDIOGRAM COMPLETE
Area-P 1/2: 4.68 cm2
Calc EF: 31.6 %
Est EF: 30
S' Lateral: 3.64 cm
Single Plane A2C EF: 34 %
Single Plane A4C EF: 28.4 %

## 2024-07-04 MED ORDER — PERFLUTREN LIPID MICROSPHERE
1.0000 mL | INTRAVENOUS | Status: AC | PRN
  Administered 2024-07-04: 2 mL via INTRAVENOUS

## 2024-07-07 ENCOUNTER — Ambulatory Visit: Payer: Self-pay | Admitting: Cardiovascular Disease

## 2024-07-07 DIAGNOSIS — I5042 Chronic combined systolic (congestive) and diastolic (congestive) heart failure: Secondary | ICD-10-CM

## 2024-07-11 MED ORDER — SPIRONOLACTONE 25 MG PO TABS: 12.5000 mg | ORAL_TABLET | Freq: Every day | ORAL | 3 refills | Status: DC

## 2024-07-11 MED ORDER — EMPAGLIFLOZIN 10 MG PO TABS: 10.0000 mg | ORAL_TABLET | Freq: Every day | ORAL | 3 refills | Status: AC

## 2024-07-11 NOTE — Addendum Note (Signed)
 Addended by: GORDON RONAL SQUIBB on: 07/11/2024 09:22 AM   Modules accepted: Orders

## 2024-07-12 ENCOUNTER — Telehealth: Payer: Self-pay | Admitting: Cardiovascular Disease

## 2024-07-12 ENCOUNTER — Other Ambulatory Visit: Payer: Self-pay

## 2024-07-12 ENCOUNTER — Encounter (HOSPITAL_COMMUNITY): Payer: Self-pay

## 2024-07-12 ENCOUNTER — Emergency Department (HOSPITAL_COMMUNITY)
Admission: EM | Admit: 2024-07-12 | Discharge: 2024-07-13 | Disposition: A | Discharge status: Home / Self Care | Attending: Emergency Medicine | Admitting: Emergency Medicine

## 2024-07-12 DIAGNOSIS — R5383 Other fatigue: Secondary | ICD-10-CM | POA: Diagnosis not present

## 2024-07-12 DIAGNOSIS — I509 Heart failure, unspecified: Secondary | ICD-10-CM | POA: Diagnosis not present

## 2024-07-12 DIAGNOSIS — Z79899 Other long term (current) drug therapy: Secondary | ICD-10-CM | POA: Insufficient documentation

## 2024-07-12 DIAGNOSIS — R531 Weakness: Secondary | ICD-10-CM | POA: Diagnosis not present

## 2024-07-12 DIAGNOSIS — E039 Hypothyroidism, unspecified: Secondary | ICD-10-CM | POA: Diagnosis not present

## 2024-07-12 DIAGNOSIS — R519 Headache, unspecified: Secondary | ICD-10-CM | POA: Diagnosis present

## 2024-07-12 DIAGNOSIS — Z7982 Long term (current) use of aspirin: Secondary | ICD-10-CM | POA: Diagnosis not present

## 2024-07-12 DIAGNOSIS — I110 Hypertensive heart disease with heart failure: Secondary | ICD-10-CM | POA: Insufficient documentation

## 2024-07-12 DIAGNOSIS — I2510 Atherosclerotic heart disease of native coronary artery without angina pectoris: Secondary | ICD-10-CM | POA: Diagnosis not present

## 2024-07-12 LAB — COMPREHENSIVE METABOLIC PANEL WITH GFR
ALT: 60 U/L — ABNORMAL HIGH (ref 0–44)
AST: 45 U/L — ABNORMAL HIGH (ref 15–41)
Albumin: 3.4 g/dL — ABNORMAL LOW (ref 3.5–5.0)
Alkaline Phosphatase: 72 U/L (ref 38–126)
Anion gap: 9 (ref 5–15)
BUN: 13 mg/dL (ref 6–20)
CO2: 24 mmol/L (ref 22–32)
Calcium: 8.7 mg/dL — ABNORMAL LOW (ref 8.9–10.3)
Chloride: 105 mmol/L (ref 98–111)
Creatinine, Ser: 0.99 mg/dL (ref 0.61–1.24)
GFR, Estimated: >60 mL/min (ref >60)
Glucose, Bld: 137 mg/dL — ABNORMAL HIGH (ref 70–99)
Potassium: 3.9 mmol/L (ref 3.5–5.1)
Sodium: 138 mmol/L (ref 135–145)
Total Bilirubin: 0.9 mg/dL (ref 0.0–1.2)
Total Protein: 6.2 g/dL — ABNORMAL LOW (ref 6.5–8.1)

## 2024-07-12 LAB — URINALYSIS, ROUTINE W REFLEX MICROSCOPIC
Bilirubin Urine: NEGATIVE
Glucose, UA: NEGATIVE mg/dL
Hgb urine dipstick: NEGATIVE
Ketones, ur: NEGATIVE mg/dL
Leukocytes,Ua: NEGATIVE
Nitrite: NEGATIVE
Protein, ur: NEGATIVE mg/dL
Specific Gravity, Urine: 1.017 (ref 1.005–1.030)
pH: 6 (ref 5.0–8.0)

## 2024-07-12 LAB — CBC
HCT: 41.5 % (ref 39.0–52.0)
Hemoglobin: 14.2 g/dL (ref 13.0–17.0)
MCH: 28.2 pg (ref 26.0–34.0)
MCHC: 34.2 g/dL (ref 30.0–36.0)
MCV: 82.3 fL (ref 80.0–100.0)
Platelets: 173 K/uL (ref 150–400)
RBC: 5.04 MIL/uL (ref 4.22–5.81)
RDW: 12.8 % (ref 11.5–15.5)
WBC: 6.7 K/uL (ref 4.0–10.5)
nRBC: 0 % (ref 0.0–0.2)

## 2024-07-12 NOTE — Telephone Encounter (Signed)
 Spoke with patient and read him results from MyChart. Pt was unable to log in. Reviewed ECHO results, medications added, lab work needed in 2 weeks and also informed patient that Pharmacy would be reaching out to patient to assist in adjusting these medications. Pt verbalized understanding and opportunities given for questions.   [place], RN [facility] Triage

## 2024-07-12 NOTE — ED Triage Notes (Signed)
 Pt arrived POV from home c/o generalized weakness and a headache since Monday. Pt also states his eyes feel weird.

## 2024-07-12 NOTE — Telephone Encounter (Signed)
Patient would like his echo results

## 2024-07-13 ENCOUNTER — Emergency Department (HOSPITAL_COMMUNITY)

## 2024-07-13 LAB — TSH: TSH: 2.163 u[IU]/mL (ref 0.350–4.500)

## 2024-07-13 LAB — T4, FREE: Free T4: 0.89 ng/dL (ref 0.61–1.12)

## 2024-07-13 NOTE — Discharge Instructions (Signed)
 Your workup today was reassuring with normal labs, CT head, and chest x-ray. If symptoms persist, recommend close follow-up with your primary care doctor. Return here for new concerns.

## 2024-07-13 NOTE — ED Provider Notes (Signed)
 [place] EMERGENCY DEPARTMENT AT [facility] Provider Note   CSN: [id number] Arrival date & time: 07/12/24  2224     Patient presents with: Weakness   Randy Moore is a 58 y.o. male.   The history is provided by the patient and medical records.  Weakness  58 year old male with history of coronary artery disease, CHF, hyperlipidemia, hypertension, hypothyroidism, presenting to the ED for generalized weakness.  Patient reports since Sunday (5 days ago) he has been feeling generally weak and fatigued.  He denies any fever/chills or recent illness.  He does report mild headache that has been waxing and waning over the past several days.  States he does not generally get headaches as this is somewhat out of the ordinary for him.  Feels it mostly in his forehead and in his eyes.  He denies any visual disturbance, dizziness, confusion, numbness, or weakness.  He is not currently on anticoagulation.  Of note, patient reports he did go out for fast food today ([employer]) and reports feeling much worse after this.  Did not have nausea/vomiting or abdominal pain.  He has noticed this trend when he gets take out/fast food vs cooking at home.  No recent medication changes.  Does admit to poor quality/duration of sleep-- 5-6 hours nightly.  Prior to Admission medications   Medication Sig Start Date End Date Taking? Authorizing Provider  aspirin  EC 81 MG EC tablet Take 1 tablet (81 mg total) by mouth daily. 09/23/16   Marylu Leita SAUNDERS, NP  atorvastatin  (LIPITOR ) 80 MG tablet Take 1 tablet (80 mg total) by mouth daily. 07/06/23   Wonda Sharper, MD  carvedilol  (COREG ) 6.25 MG tablet Take 1 tablet (6.25 mg total) by mouth 2 (two) times daily with a meal. 07/06/23   Wonda Sharper, MD  empagliflozin  (JARDIANCE ) 10 MG TABS tablet Take 1 tablet (10 mg total) by mouth daily before breakfast. 07/11/24   Wonda Sharper, MD  famotidine  (PEPCID ) 20 MG tablet Take 1 tablet (20 mg total) by mouth as  needed. TAKE 1 TABLET(20 MG) BY MOUTH TWICE DAILY 09/28/23   Swinyer, Rosaline HERO, NP  levothyroxine (SYNTHROID) 50 MCG tablet Take 50 mcg by mouth daily before breakfast. 03/26/21   [provider]  nitroGLYCERIN  (NITROSTAT ) 0.4 MG SL tablet PLACE 1 TABLET UNDER TONGUE EVERY 5 MINUTES FOR 3 DOSES AS NEEDED FOR CHEST PAIN 01/09/17   Lelon Hamilton T, PA-C  spironolactone  (ALDACTONE ) 25 MG tablet Take 0.5 tablets (12.5 mg total) by mouth daily. 07/11/24   Wonda Sharper, MD  valsartan  (DIOVAN ) 160 MG tablet Take 1 tablet (160 mg total) by mouth daily. 09/28/23   Swinyer, Rosaline HERO, NP    Allergies: Patient has no known allergies.    Review of Systems  Neurological:  Positive for weakness.  All other systems reviewed and are negative.   Updated Vital Signs BP (!) 178/86 (BP Location: Right Arm)   Pulse 63   Temp 98 F (36.7 C)   Resp 19   Ht 5' 8 (1.727 m)   Wt 81.6 kg   SpO2 100%   BMI 27.37 kg/m   Physical Exam Vitals and nursing note reviewed.  Constitutional:      Appearance: He is well-developed.  HENT:     Head: Normocephalic and atraumatic.  Eyes:     Conjunctiva/sclera: Conjunctivae normal.     Pupils: Pupils are equal, round, and reactive to light.  Neck:     Comments: Full ROM, no rigidity Cardiovascular:  Rate and Rhythm: Normal rate and regular rhythm.     Heart sounds: Normal heart sounds.  Pulmonary:     Effort: Pulmonary effort is normal.     Breath sounds: Normal breath sounds.  Abdominal:     General: Bowel sounds are normal.     Palpations: Abdomen is soft.  Musculoskeletal:        General: Normal range of motion.     Cervical back: Normal range of motion.  Skin:    General: Skin is warm and dry.  Neurological:     Mental Status: He is alert and oriented to person, place, and time.     Comments: AAOx3, answering questions and following commands appropriately; equal strength UE and LE bilaterally; CN grossly intact; moves all extremities  appropriately without ataxia; no focal neuro deficits or facial asymmetry appreciated     (all labs ordered are listed, but only abnormal results are displayed) Labs Reviewed  COMPREHENSIVE METABOLIC PANEL WITH GFR - Abnormal; Notable for the following components:      Result Value   Glucose, Bld 137 (*)    Calcium  8.7 (*)    Total Protein 6.2 (*)    Albumin 3.4 (*)    AST 45 (*)    ALT 60 (*)    All other components within normal limits  CBC  URINALYSIS, ROUTINE W REFLEX MICROSCOPIC  TSH  T4, FREE  CBG MONITORING, ED    EKG: None  Radiology: CT Head Wo Contrast Result Date: 07/13/2024 EXAM: CT HEAD WITHOUT CONTRAST 07/13/2024 01:07:08 AM TECHNIQUE: CT of the head was performed without the administration of intravenous contrast. Automated exposure control, iterative reconstruction, and/or weight based adjustment of the mA/kV was utilized to reduce the radiation dose to as low as reasonably achievable. COMPARISON: None available. CLINICAL HISTORY: Headache, increasing frequency or severity FINDINGS: BRAIN AND VENTRICLES: No acute hemorrhage. No evidence of acute infarct. No hydrocephalus. No extra-axial collection. No mass effect or midline shift. ORBITS: No acute abnormality. SINUSES: No acute abnormality. SOFT TISSUES AND SKULL: No acute soft tissue abnormality. No skull fracture. IMPRESSION: 1. No acute intracranial abnormality. Electronically signed by: Franky Crease MD 07/13/2024 01:13 AM EST RP Workstation: [id number]   DG Chest 2 View Result Date: 07/13/2024 EXAM: 2 VIEW(S) XRAY OF THE CHEST 07/13/2024 12:53:00 AM COMPARISON: 11/24/2020 CLINICAL HISTORY: Weakness FINDINGS: LUNGS AND PLEURA: No focal pulmonary opacity. No pleural effusion. No pneumothorax. HEART AND MEDIASTINUM: Coronary artery stent noted. BONES AND SOFT TISSUES: Mild thoracic spondylosis. No acute osseous abnormality. IMPRESSION: 1. No acute cardiopulmonary process. Electronically signed by: Oneil Devonshire MD  07/13/2024 01:00 AM EST RP Workstation: [id number]     Procedures   Medications Ordered in the ED - No data to display                                  Medical Decision Making Amount and/or Complexity of Data Reviewed Labs: ordered. Radiology: ordered and independent interpretation performed. ECG/medicine tests: ordered and independent interpretation performed.   58 year old male presenting to the ED for headache and generalized weakness for the past 5 days.  He denies any fever or acute illness.  No syncope.  No chest pain or SOB.  Does report recently having poor sleep with only 5-6 hours a night.  Afebrile, nontoxic in appearance here.  He does not have any focal neurologic deficits.  Headache is without meningeal signs.  Labs obtained from triage  reviewed--no leukocytosis or electrolyte derangement.  Does have some minor elevation of LFTs, similar to prior.  UA without any signs of infection.  Obtained CT head and chest x-ray, both without any acute findings.  Does have history of hypothyroidism, TSH today is normal.  Patient feels reassured.  Poor sleep recently may be contributing.  Can also consider vitamin deficiencies such as vitamin D, B12, etc.  I recommended that he follow-up closely with his primary care doctor.  Can return here for new concerns.  Final diagnoses:  Generalized weakness    ED Discharge Orders     None          Jarold Olam HERO, PA-C 07/13/24 0237    Haze Lonni PARAS, MD 07/13/24 [phone]

## 2024-07-27 ENCOUNTER — Ambulatory Visit (HOSPITAL_COMMUNITY)

## 2024-07-27 ENCOUNTER — Encounter (HOSPITAL_COMMUNITY): Payer: Self-pay | Admitting: Emergency Medicine

## 2024-07-27 ENCOUNTER — Ambulatory Visit (HOSPITAL_COMMUNITY): Admission: EM | Admit: 2024-07-27 | Discharge: 2024-07-27 | Disposition: A | Discharge status: Home / Self Care

## 2024-07-27 DIAGNOSIS — M79644 Pain in right finger(s): Secondary | ICD-10-CM

## 2024-07-27 DIAGNOSIS — S62524A Nondisplaced fracture of distal phalanx of right thumb, initial encounter for closed fracture: Secondary | ICD-10-CM | POA: Diagnosis not present

## 2024-07-27 NOTE — Discharge Instructions (Addendum)
 Keep thumb spica splint clean and dry and in place until you are evaluated with orthopedics. If you notice any discoloration, numbness, or tingling of your right hand or thumb seek medical evaluation Monitor wound for signs of infection including redness, swelling, and yellow/green discharge.  Should this occur seek medical reevaluation

## 2024-07-27 NOTE — ED Triage Notes (Addendum)
 Patient was playing soccer and fell and hurt right thumb.  Patient thumb is bleeding., patient has not taken anything for pain, Patient is having abdominal that has been going on for a week
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
## 2024-08-16 ENCOUNTER — Other Ambulatory Visit: Payer: Self-pay | Admitting: Cardiovascular Disease

## 2024-08-19 LAB — BASIC METABOLIC PANEL WITH GFR
BUN/Creatinine Ratio: 9 — ABNORMAL LOW (ref 10–24)
BUN: 11 mg/dL (ref 8–27)
CO2: 24 mmol/L (ref 20–29)
Calcium: 9.6 mg/dL (ref 8.6–10.2)
Chloride: 100 mmol/L (ref 96–106)
Creatinine, Ser: 1.27 mg/dL (ref 0.76–1.27)
Glucose: 87 mg/dL (ref 70–99)
Potassium: 4 mmol/L (ref 3.5–5.2)
Sodium: 139 mmol/L (ref 134–144)
eGFR: 65 mL/min/1.73

## 2024-08-29 NOTE — Progress Notes (Signed)
 Patient ID: Randy Moore                 DOB: 12-16-1965                      MRN: [id number]     HPI: Randy Moore is a 59 y.o. male referred by Dr. Wonda to pharmacy clinic for HF medication management. PMH is significant for CAD s/p PCI, HFrEF, HLD, HTN, and prostate cancer. Most recent LVEF 30% on 07/04/2024.  At the last visit with the MD on 06/19/2024, the patient reported worsening exercise tolerance and fatigue, and a follow-up echo was ordered. The results from the echo on 07/04/24 showed worsening LVEF function of 30%, which was 40-45% prior. At that time, Jardiance  10 mg daily and spironolactone  12.5 mg daily were started.   At today's visit, he reports adherence with most of his medications. He was prescribed 12.5 mg of spironolactone , but he has been taking a whole pill (25 mg) since he was started on it. Most recent labs showed stable renal function and potassium level. He states that he has been out of his aspirin  for several months, and has been 'extending' his doses of atorvastatin  and valsartan . He has had issues with affordability in the past, but recently changed from [facility] to [employer]. Unfortunately, he did not bring his insurance card to clinic today, and we were unable to verify pharmacy benefits with PA team. He does not check his blood pressure at home, but he does have a functioning BP cuff.   Symptomatically, he is feeling well; he denies dizziness, lightheadedness, and fatigue. Denies chest pain or palpitations but reports pain on his left side that may be due to an old soccer injury. Reports dyspnea and shortness of breath when exercising, which stops him from running for as long as he used to. Able to complete all ADLs. He does not check his weight at home. He does not adhere to a low-salt diet.  Discussion with the patient today included the following: cardiac medication indications, introduction to the GDMT clinic, reasoning behind medication titration, importance of  medication adherence, and patient engagement.  Current CHF meds: carvedilol  6.25 mg twice daily, Jardiance  10 mg daily, spironolactone  12.5 mg daily (taking 25 mg at home), valsartan  160 mg daily Previously tried: Entresto  (d/c'd due to affordability concerns)   Adherence Assessment Do you ever forget to take your medication? [] Yes [x] No  Do you ever skip doses due to side effects? [] Yes [x] No  Do you have trouble affording your medicines? [] Yes [x] No  Are you ever unable to pick up your medication due to transportation difficulties? [] Yes [x] No  Do you ever stop taking your medications because you don't believe they are helping? [] Yes [x] No  Do you check your weight daily? [] Yes [x] No   Adherence strategy:  Takes medications out of the bottle. He has never tried a weekly pill organizer, provided one for him in clinic today.  Barriers to obtaining medications: affordability, unable to verify that he has pharmacy benefits  BP goal: <130/80  Family History:  Father: MI  Social History:  Tobacco: never  EtOH: never   Diet:  Breakfast- cereal  Lunch & Dinner- rice, plantains, cassava, fish Beverages- sprite, coke, water   Exercise:  Very active, plays soccer every weekend   Home BP readings: N/A - doesn't check at home  Wt Readings from Last 3 Encounters:  07/12/24 180 lb (81.6 kg)  06/19/24 192 lb (87.1  kg)  10/26/23 185 lb 3.2 oz (84 kg)   BP Readings from Last 3 Encounters:  08/30/24 (!) 142/90  07/27/24 (!) 147/91  07/13/24 (!) 163/92   Pulse Readings from Last 3 Encounters:  08/30/24 60  07/27/24 72  07/13/24 (!) 57    Renal function: CrCl cannot be calculated (Unknown ideal weight.).  Past Medical History:  Diagnosis Date   Acute right ventricular myocardial infarction (HCC) 09/21/2016   Cardiogenic shock (HCC) 09/21/2016   Chronic combined systolic and diastolic heart failure (HCC) 09/21/2016   Coronary artery disease involving native coronary  artery of native heart without angina pectoris 09/21/2016   a - Inf and RV infarct (STEMI) 1/18: LHC - pLAD, oD1 80, pRI 70, oLCx 30, mLCx 70, RCA 100, EF 45-50 >> PCI:  2.5 x 20 mm Promus DES to pRCA // Staged PCI 09/22/16: 3 x 38 mm Promus DES to mLAD; Attempted POBA to oD2 (80 >> 80); 3 x 28 mm Promus DES to RI   Enlarged prostate    Hyperlipidemia 09/29/2016   Hypertension    Hypothyroidism    Ischemic cardiomyopathy 09/23/2016   a - s/p Inf and RV infarct (STEMI) 1/18 // b - Echo 09/23/16: EF 35-40, inf-septal HK, apical septal and apical AK, ant and inf AK, ant-septal AK, Gr 1 diastolic dysfunction // c -Echo 5/18: mild conc LVH, EF 35-40, Gr 1 DD, inf-septal, ant-septal, ant, apical AK   Noncompliance with medication regimen    Presence of drug coated stent in right coronary artery 09/21/2016   Prostate cancer (HCC)    S/P angioplasty with stent, LAD and Ramus with DES 09/22/16 09/23/2016   STEMI involving oth coronary artery of inferior wall (HCC) 09/21/2016    Medications Ordered Prior to Encounter[1]  Allergies[2]   Assessment/Plan:  1. CHF -  Chronic combined systolic and diastolic heart failure (HCC) Assessment:  Patient on 4 pillars of GDMT Patient has been taking 25 mg of spironolactone  since being started on therapy, rather than the prescribed 12.5 mg daily. Labs have remained stable since initiation - will continue current dose Blood pressure uncontrolled in clinic today - there is room to increase valsartan   Heart rate is 60 today - limited room for carvedilol  titration  Patient has dyspnea on exertion, but otherwise no current symptoms of a exacerbation Unable to verify pharmacy benefits with the PA team or the patient. If difficulty affording medication, encouraged him to call the clinic or send a MyChart message regarding cost concerns.  Plan:  INCREASE valsartan  to 160 mg twice daily Continue carvedilol  6.25 mg twice daily, Jardiance  10 mg daily, and spironolactone  25  mg daily. RESTART aspirin  81 mg daily Prescriptions sent in for atorvastatin  80 mg (Rx expired), valsartan  160 mg twice daily, aspirin  81 mg daily, spironolactone  25 mg daily, and nitroglycerin  0.4 mg SL PRN (Rx expired)  Discussed the importance of at-home BP monitoring, encouraged daily BP readings, and recommend bringing in a log of readings at the next visit.  Orders placed for follow-up BMP; instructed the patient to come get lab work done 2 weeks after increasing the valsartan  dose. Will follow up with the patient after lab results.  Provided him a weekly AM/PM pill organizer to assist in medication adherence. Recommended to call clinic or send a MyChart message if prescriptions are unaffordable at pharmacy. Follow-up scheduled with PharmD on 09/27/2023  Thank you  Woodie Jock, PharmD PGY1 Pharmacy Resident  08/30/2024  Eleanor JONETTA Crews, Pharm.D, BCACP, CPP [facility] of [facility] [address], [place], [place] [address]  Phone: [phone]; Fax: [phone]      [1]  Current Outpatient Medications on File Prior to Visit  Medication Sig Dispense Refill   carvedilol  (COREG ) 6.25 MG tablet Take 1 tablet (6.25 mg total) by mouth 2 (two) times daily with a meal. 180 tablet 3   empagliflozin  (JARDIANCE ) 10 MG TABS tablet Take 1 tablet (10 mg total) by mouth daily before breakfast. 30 tablet 3   levothyroxine (SYNTHROID) 50 MCG tablet Take 50 mcg by mouth daily before breakfast.     famotidine  (PEPCID ) 20 MG tablet Take 1 tablet (20 mg total) by mouth as needed. TAKE 1 TABLET(20 MG) BY MOUTH TWICE DAILY (Patient not taking: Reported on 08/30/2024) 180 tablet 3   No current facility-administered medications on file prior to visit.  [2] No Known Allergies

## 2024-08-30 ENCOUNTER — Telehealth: Payer: Self-pay | Admitting: Pharmacy Technician

## 2024-08-30 ENCOUNTER — Ambulatory Visit: Attending: Cardiology | Admitting: Pharmacist

## 2024-08-30 ENCOUNTER — Other Ambulatory Visit (HOSPITAL_COMMUNITY): Payer: Self-pay

## 2024-08-30 DIAGNOSIS — I5042 Chronic combined systolic (congestive) and diastolic (congestive) heart failure: Secondary | ICD-10-CM | POA: Diagnosis not present

## 2024-08-30 MED ORDER — ATORVASTATIN CALCIUM 80 MG PO TABS
80.0000 mg | ORAL_TABLET | Freq: Every day | ORAL | 3 refills | Status: AC
  Filled 2024-08-30: qty 90, 90d supply, fill #0

## 2024-08-30 MED ORDER — NITROGLYCERIN 0.4 MG SL SUBL
0.4000 mg | SUBLINGUAL_TABLET | SUBLINGUAL | 4 refills | Status: AC | PRN
  Filled 2024-08-30: qty 25, 10d supply, fill #0

## 2024-08-30 MED ORDER — VALSARTAN 160 MG PO TABS
160.0000 mg | ORAL_TABLET | Freq: Two times a day (BID) | ORAL | 3 refills | Status: AC
  Filled 2024-08-30: qty 90, 45d supply, fill #0

## 2024-08-30 MED ORDER — SPIRONOLACTONE 25 MG PO TABS
25.0000 mg | ORAL_TABLET | Freq: Every day | ORAL | 3 refills | Status: AC
  Filled 2024-08-30: qty 90, 90d supply, fill #0

## 2024-08-30 MED ORDER — ASPIRIN 81 MG PO TBEC
81.0000 mg | DELAYED_RELEASE_TABLET | Freq: Every day | ORAL | 3 refills | Status: AC
  Filled 2024-08-30: qty 90, 90d supply, fill #0

## 2024-08-30 NOTE — Patient Instructions (Addendum)
 INCREASE valsartan  to 160 mg twice daily  RESTART taking aspirin  81 mg daily  Continue taking:  Carvedilol  6.25 mg twice daily Jardiance  10 mg daily Spironolactone  25 mg daily Atorvastatin  80 mg daily   Start taking omeprazole 20 mg daily  You can pick up aspirin  81 mg and omeprazole 20 mg over the counter at your local pharmacy.   Come back to get your lab work done in two weeks. We will follow-up with you following your results.   Please start taking your blood pressure readings daily and logging them and bring these readings into your next visit.   Your blood pressure goal is < 130/64mmHg  Important lifestyle changes to control high blood pressure  Intervention  Effect on the BP   Weight loss Weight loss is one of the most effective lifestyle changes for controlling blood pressure. If you're overweight or obese, losing even a small amount of weight can help reduce blood pressure.    Blood pressure can decrease by 1 millimeter of mercury (mmHg) with each kilogram (about 2.2 pounds) of weight lost.   Exercise regularly As a general goal, aim for 30 minutes of moderate physical activity every day.    Regular physical activity can lower blood pressure by 5 - 8 mmHg.   Eat a healthy diet Eat a diet rich in whole grains, fruits, vegetables, lean meat, and low-fat dairy products. Limit processed foods, saturated fat, and sweets.    A heart-healthy diet can lower high blood pressure by 10 mmHg.   Reduce salt (sodium) in your diet Aim for 000mg  of sodium each day. Avoid deli meats, canned food, and frozen microwave meals which are high in sodium.     Limiting sodium can reduce blood pressure by 5 mmHg.   Limit alcohol One drink equals 12 ounces of beer, 5 ounces of wine, or 1.5 ounces of 80-proof liquor.    Limiting alcohol to < 1 drink a day for women or < 2 drinks a day for men can help lower blood pressure by about 4 mmHg.   To check your pressure at home you will  need to:   Sit up in a chair, with feet flat on the floor and back supported. Do not cross your ankles or legs. Rest your left arm so that the cuff is about heart level. If the cuff goes on your upper arm, then just relax your arm on the table, arm of the chair, or your lap. If you have a wrist cuff, hold your wrist against your chest at heart level. Place the cuff snugly around your arm, about 1 inch above the crease of your elbow. The cords should be inside the groove of your elbow.  Sit quietly, with the cuff in place, for about 5 minutes. Then press the power button to start a reading. Do not talk or move while the reading is taking place.  Record your readings on a sheet of paper. Although most cuffs have a memory, it is often easier to see a pattern developing when the numbers are all in front of you.  You can repeat the reading after 1-3 minutes if it is recommended.   Make sure your bladder is empty and you have not had caffeine or tobacco within the last 30 minutes   Always bring your blood pressure log with you to your appointments. If you have not brought your monitor in to be double checked for accuracy, please bring it to your next appointment.  You can find a list of validated (accurate) blood pressure cuffs at: validatebp.org

## 2024-08-30 NOTE — Telephone Encounter (Signed)
 No insurance found. The [employer] id [id number] is showing termed. Patient staying on valsartan 

## 2024-08-30 NOTE — Assessment & Plan Note (Addendum)
 Assessment:  Patient on 4 pillars of GDMT Patient has been taking 25 mg of spironolactone  since being started on therapy, rather than the prescribed 12.5 mg daily. Labs have remained stable since initiation - will continue current dose Blood pressure uncontrolled in clinic today - there is room to increase valsartan   Heart rate is 60 today - limited room for carvedilol  titration  Patient has dyspnea on exertion, but otherwise no current symptoms of a exacerbation Unable to verify pharmacy benefits with the PA team or the patient. If difficulty affording medication, encouraged him to call the clinic or send a MyChart message regarding cost concerns.  Plan:  INCREASE valsartan  to 160 mg twice daily Continue carvedilol  6.25 mg twice daily, Jardiance  10 mg daily, and spironolactone  25 mg daily. RESTART aspirin  81 mg daily Prescriptions sent in for atorvastatin  80 mg (Rx expired), valsartan  160 mg twice daily, aspirin  81 mg daily, spironolactone  25 mg daily, and nitroglycerin  0.4 mg SL PRN (Rx expired)  Discussed the importance of at-home BP monitoring, encouraged daily BP readings, and recommend bringing in a log of readings at the next visit.  Orders placed for follow-up BMP; instructed the patient to come get lab work done 2 weeks after increasing the valsartan  dose. Will follow up with the patient after lab results.  Provided him a weekly AM/PM pill organizer to assist in medication adherence. Recommended to call clinic or send a MyChart message if prescriptions are unaffordable at pharmacy. Follow-up scheduled with PharmD on 09/27/2023

## 2024-09-26 ENCOUNTER — Ambulatory Visit: Admitting: Pharmacist

## 2024-09-26 NOTE — Progress Notes (Unsigned)
 Patient ID: RAN TULLIS                 DOB: 11-18-1965                      MRN: [id number]     HPI: Randy Moore is a 59 y.o. male referred by Dr. Wonda to pharmacy clinic for HF medication management. PMH is significant for CAD s/p PCI, HFrEF, HLD, HTN, and prostate cancer. Most recent LVEF 30% on 07/04/2024.  At the last visit with the MD on 06/19/2024, the patient reported worsening exercise tolerance and fatigue, and a follow-up echo was ordered. The results from the echo on 07/04/24 showed worsening LVEF function of 30%, which was 40-45% prior. At that time, Jardiance  10 mg daily and spironolactone  12.5 mg daily were started.   Patient seen in PharmD clinic 08/30/24. BP was 142/90. He admitted he had been extending his doses of atorvastatin  and valsartan . He states his insurance changed to [employer], but we couldn't not find active coverage. His prescriptions were sent to [facility] pharmacy and appears they were filled for cash. Valsartan  was increased to 160mg  BID. He was asked to check blood pressure at home and come for labs in 2 weeks.  Needs bmp Home readings? Insurance? compliance  At today's visit, he reports adherence with most of his medications. He was prescribed 12.5 mg of spironolactone , but he has been taking a whole pill (25 mg) since he was started on it. Most recent labs showed stable renal function and potassium level. He states that he has been out of his aspirin  for several months, and has been 'extending' his doses of atorvastatin  and valsartan . He has had issues with affordability in the past, but recently changed from [employer] to [employer]. Unfortunately, he did not bring his insurance card to clinic today, and we were unable to verify pharmacy benefits with PA team. He does not check his blood pressure at home, but he does have a functioning BP cuff.   Symptomatically, he is feeling well; he denies dizziness, lightheadedness, and fatigue. Denies chest pain or palpitations but reports  pain on his left side that may be due to an old soccer injury. Reports dyspnea and shortness of breath when exercising, which stops him from running for as long as he used to. Able to complete all ADLs. He does not check his weight at home. He does not adhere to a low-salt diet.  Discussion with the patient today included the following: cardiac medication indications, introduction to the GDMT clinic, reasoning behind medication titration, importance of medication adherence, and patient engagement.  Current CHF meds: carvedilol  6.25 mg twice daily, Jardiance  10 mg daily, spironolactone  25 mg daily, valsartan  160 mg twice a day Previously tried: Entresto  (d/c'd due to affordability concerns)   Adherence Assessment Do you ever forget to take your medication? [] Yes [x] No  Do you ever skip doses due to side effects? [] Yes [x] No  Do you have trouble affording your medicines? [] Yes [x] No  Are you ever unable to pick up your medication due to transportation difficulties? [] Yes [x] No  Do you ever stop taking your medications because you don't believe they are helping? [] Yes [x] No  Do you check your weight daily? [] Yes [x] No   Adherence strategy:  Takes medications out of the bottle. He has never tried a weekly pill organizer, provided one for him in clinic today.  Barriers to obtaining medications: affordability, unable to verify that he has pharmacy benefits  BP goal: <130/80  Family History:  Father: MI  Social History:  Tobacco: never  EtOH: never   Diet:  Breakfast- cereal  Lunch & Dinner- rice, plantains, cassava, fish Beverages- sprite, coke, water   Exercise:  Very active, plays soccer every weekend   Home BP readings: N/A - doesn't check at home  Wt Readings from Last 3 Encounters:  07/12/24 180 lb (81.6 kg)  06/19/24 192 lb (87.1 kg)  10/26/23 185 lb 3.2 oz (84 kg)   BP Readings from Last 3 Encounters:  08/30/24 (!) 142/90  07/27/24 (!) 147/91  07/13/24 (!)  163/92   Pulse Readings from Last 3 Encounters:  08/30/24 60  07/27/24 72  07/13/24 (!) 57    Renal function: CrCl cannot be calculated (Patient's most recent lab result is older than the maximum 21 days allowed.).  Past Medical History:  Diagnosis Date   Acute right ventricular myocardial infarction (HCC) 09/21/2016   Cardiogenic shock (HCC) 09/21/2016   Chronic combined systolic and diastolic heart failure (HCC) 09/21/2016   Coronary artery disease involving native coronary artery of native heart without angina pectoris 09/21/2016   a - Inf and RV infarct (STEMI) 1/18: LHC - pLAD, oD1 80, pRI 70, oLCx 30, mLCx 70, RCA 100, EF 45-50 >> PCI:  2.5 x 20 mm Promus DES to pRCA // Staged PCI 09/22/16: 3 x 38 mm Promus DES to mLAD; Attempted POBA to oD2 (80 >> 80); 3 x 28 mm Promus DES to RI   Enlarged prostate    Hyperlipidemia 09/29/2016   Hypertension    Hypothyroidism    Ischemic cardiomyopathy 09/23/2016   a - s/p Inf and RV infarct (STEMI) 1/18 // b - Echo 09/23/16: EF 35-40, inf-septal HK, apical septal and apical AK, ant and inf AK, ant-septal AK, Gr 1 diastolic dysfunction // c -Echo 5/18: mild conc LVH, EF 35-40, Gr 1 DD, inf-septal, ant-septal, ant, apical AK   Noncompliance with medication regimen    Presence of drug coated stent in right coronary artery 09/21/2016   Prostate cancer (HCC)    S/P angioplasty with stent, LAD and Ramus with DES 09/22/16 09/23/2016   STEMI involving oth coronary artery of inferior wall (HCC) 09/21/2016    Medications Ordered Prior to Encounter[1]  Allergies[2]   Assessment/Plan:  1. CHF -  No problem-specific Assessment & Plan notes found for this encounter.     Hadassa Cermak D Shylynn Bruning, Pharm.JONETTA SARAN, CPP [facility] of [facility] [address], [place], [place] [address]  Phone: [phone]; Fax: [phone]      [1]  Current Outpatient Medications on File Prior to Visit  Medication Sig Dispense  Refill   aspirin  EC 81 MG tablet Take 1 tablet (81 mg total) by mouth daily. 90 tablet 3   atorvastatin  (LIPITOR ) 80 MG tablet Take 1 tablet (80 mg total) by mouth daily. 90 tablet 3   carvedilol  (COREG ) 6.25 MG tablet Take 1 tablet (6.25 mg total) by mouth 2 (two) times daily with a meal. 180 tablet 3   empagliflozin  (JARDIANCE ) 10 MG TABS tablet Take 1 tablet (10 mg total) by mouth daily before breakfast. 30 tablet 3   famotidine  (PEPCID ) 20 MG tablet Take 1 tablet (20 mg total) by mouth as needed. TAKE 1 TABLET(20 MG) BY MOUTH TWICE DAILY (Patient not taking: Reported on 08/30/2024) 180 tablet 3   levothyroxine (SYNTHROID) 50 MCG tablet Take 50 mcg by mouth daily before breakfast.  nitroGLYCERIN  (NITROSTAT ) 0.4 MG SL tablet Place 1 tablet (0.4 mg total) under the tongue every 5 (five) minutes as needed for chest pain. 25 tablet 4   spironolactone  (ALDACTONE ) 25 MG tablet Take 1 tablet (25 mg total) by mouth daily. 90 tablet 3   valsartan  (DIOVAN ) 160 MG tablet Take 1 tablet (160 mg total) by mouth 2 (two) times daily. 90 tablet 3   No current facility-administered medications on file prior to visit.  [2] No Known Allergies
